# Patient Record
Sex: Male | Born: 1945
Health system: Southern US, Community
[De-identification: ages and names within clinical notes are randomized; demographics above are authoritative.]

## PROBLEM LIST (undated history)

## (undated) DIAGNOSIS — D500 Iron deficiency anemia secondary to blood loss (chronic): Secondary | ICD-10-CM

## (undated) DIAGNOSIS — K56600 Partial intestinal obstruction, unspecified as to cause: Secondary | ICD-10-CM

## (undated) DIAGNOSIS — E785 Hyperlipidemia, unspecified: Secondary | ICD-10-CM

## (undated) DIAGNOSIS — Z860101 Personal history of adenomatous and serrated colon polyps: Secondary | ICD-10-CM

## (undated) DIAGNOSIS — C182 Malignant neoplasm of ascending colon: Secondary | ICD-10-CM

## (undated) DIAGNOSIS — Z993 Dependence on wheelchair: Secondary | ICD-10-CM

## (undated) DIAGNOSIS — Z86010 Personal history of colonic polyps: Secondary | ICD-10-CM

## (undated) HISTORY — PX: COLONOSCOPY: SHX174

## (undated) HISTORY — DX: Iron deficiency anemia secondary to blood loss (chronic): D50.0

## (undated) HISTORY — PX: BACK SURGERY: SHX140

## (undated) HISTORY — DX: Partial intestinal obstruction, unspecified as to cause: K56.600

## (undated) HISTORY — DX: Malignant neoplasm of ascending colon: C18.2

## (undated) HISTORY — DX: Personal history of adenomatous and serrated colon polyps: Z86.0101

## (undated) HISTORY — DX: Personal history of colonic polyps: Z86.010

---

## 1989-01-24 HISTORY — PX: CHOLECYSTECTOMY: SHX55

## 2001-08-15 ENCOUNTER — Ambulatory Visit (HOSPITAL_COMMUNITY): Admission: RE | Admit: 2001-08-15 | Discharge: 2001-08-15 | Payer: Self-pay | Admitting: Internal Medicine

## 2001-08-15 ENCOUNTER — Encounter: Payer: Self-pay | Admitting: Internal Medicine

## 2002-01-24 DIAGNOSIS — C182 Malignant neoplasm of ascending colon: Secondary | ICD-10-CM

## 2002-01-24 HISTORY — DX: Malignant neoplasm of ascending colon: C18.2

## 2002-12-16 ENCOUNTER — Emergency Department (HOSPITAL_COMMUNITY): Admission: EM | Admit: 2002-12-16 | Discharge: 2002-12-16 | Payer: Self-pay | Admitting: *Deleted

## 2002-12-30 ENCOUNTER — Ambulatory Visit (HOSPITAL_COMMUNITY): Admission: RE | Admit: 2002-12-30 | Discharge: 2002-12-30 | Payer: Self-pay | Admitting: Gastroenterology

## 2002-12-30 ENCOUNTER — Encounter (INDEPENDENT_AMBULATORY_CARE_PROVIDER_SITE_OTHER): Payer: Self-pay | Admitting: Specialist

## 2003-01-06 ENCOUNTER — Encounter: Admission: RE | Admit: 2003-01-06 | Discharge: 2003-01-06 | Payer: Self-pay | Admitting: General Surgery

## 2003-01-06 IMAGING — CT CT PELVIS W/ CM
1 of 2 series · 14 of 32 positions shown, 18 images · IV contrast (GASTRO. & OMNIAPQUE [ID])
Comparison: none

CLINICAL DATA: 57 year old male with new diagnosis of colon cancer in the ascending colon. Con ? none
CHEST, ABDOMEN AND PELVIS CT WITH CONTRAST
Comparison ? none.
TECHNIQUE: Contiguous 5 mm axial images were obtained from the thoracic inlet through the pubic symphysis after spiral CT scanning.  Oral and 100 cc of nonionic intravenous contrast was administered prior to imaging.

[Series 2: chest/abd/pelvis · axial · 0.70mm/px · z∈[-555,-11]mm · 14 of 151 slices shown, 18 images]
[im 7/151  soft-tissue]
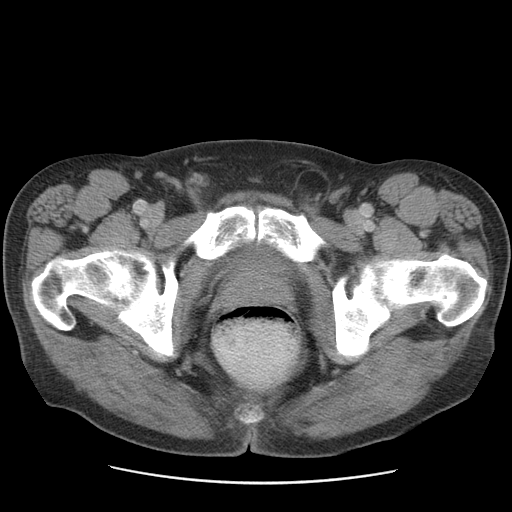
[im 7/151  bone]
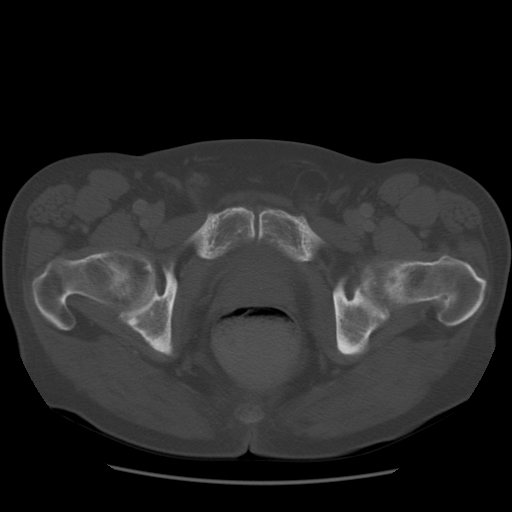
[im 20/151  soft-tissue]
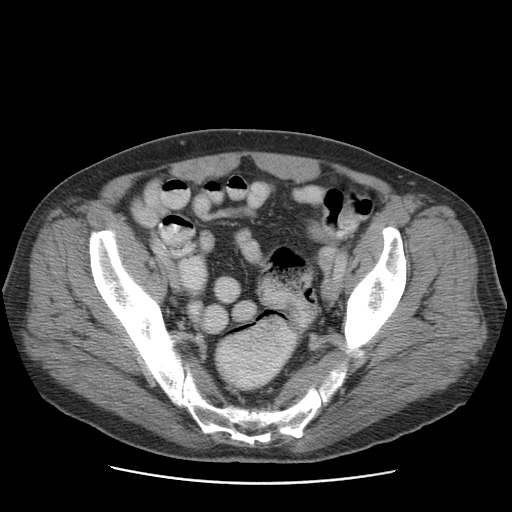
[im 33/151  soft-tissue]
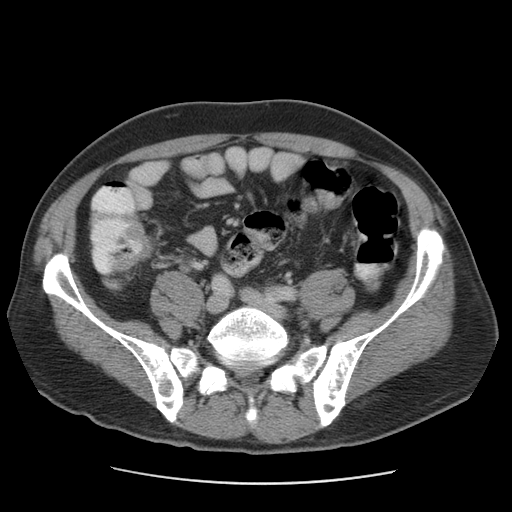
[im 46/151  soft-tissue]
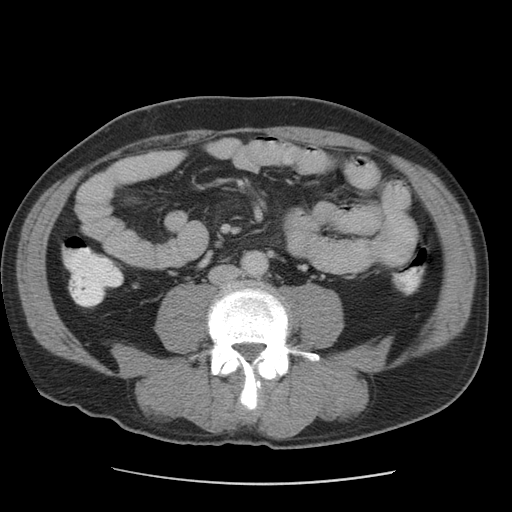
[im 59/151  soft-tissue]
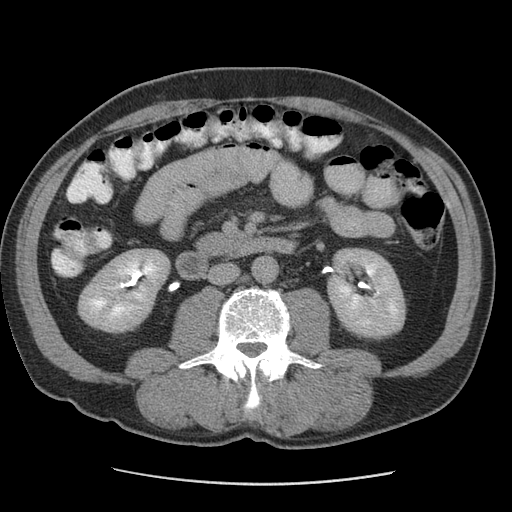
[im 72/151  soft-tissue]
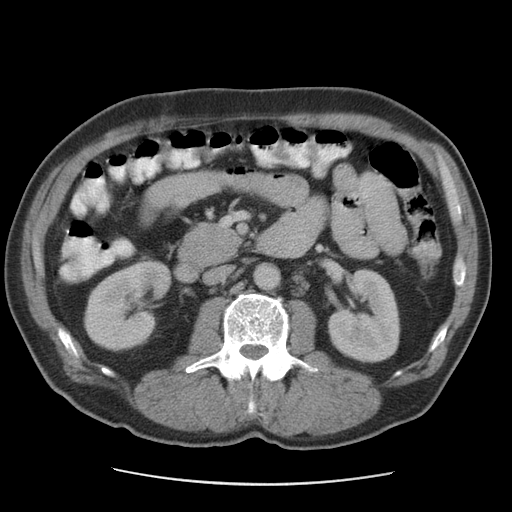
[im 79/151  soft-tissue]
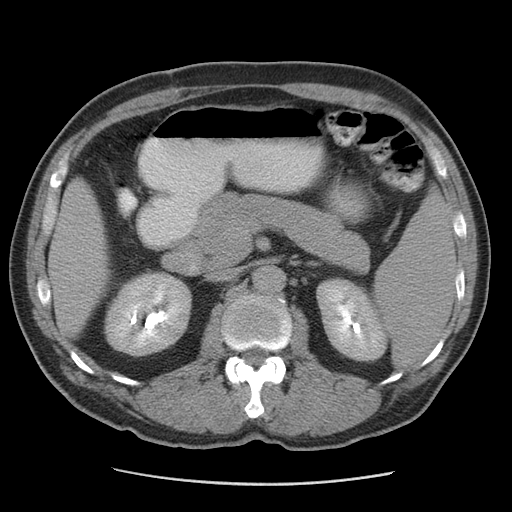
[im 92/151  soft-tissue]
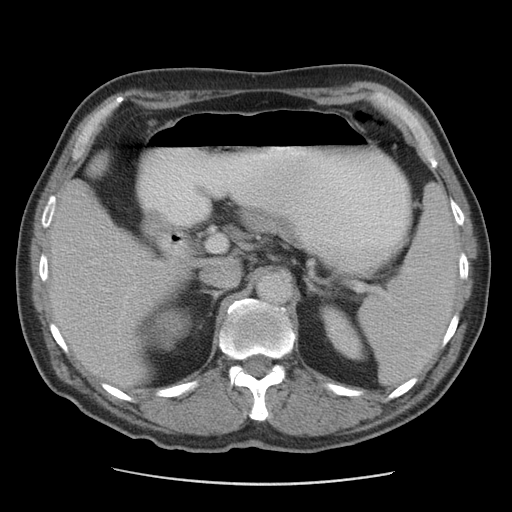
[im 105/151  soft-tissue]
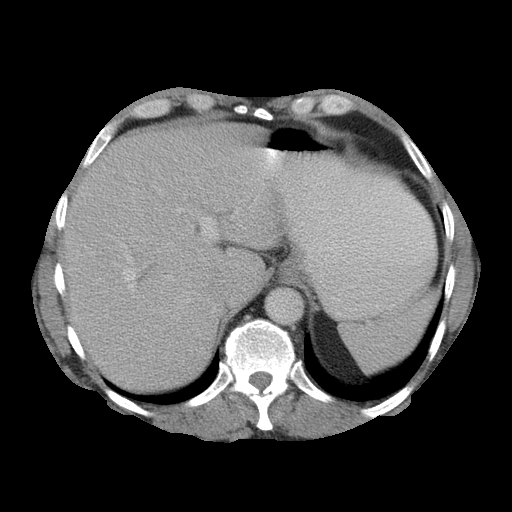
[im 105/151  bone]
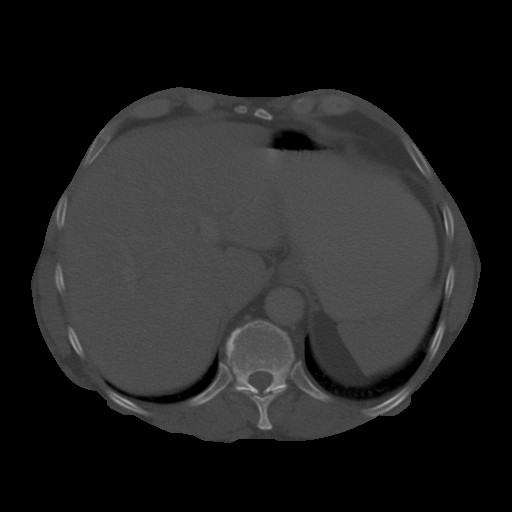
[im 118/151  soft-tissue]
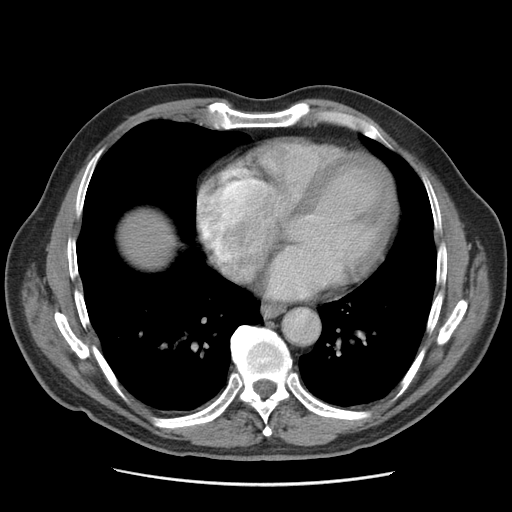
[im 124/151  lung]
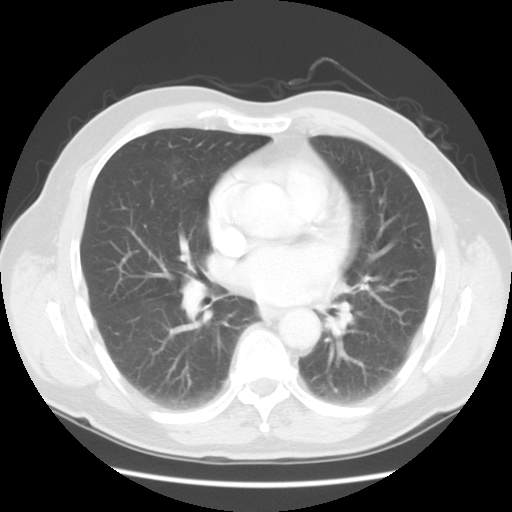
[im 131/151  soft-tissue]
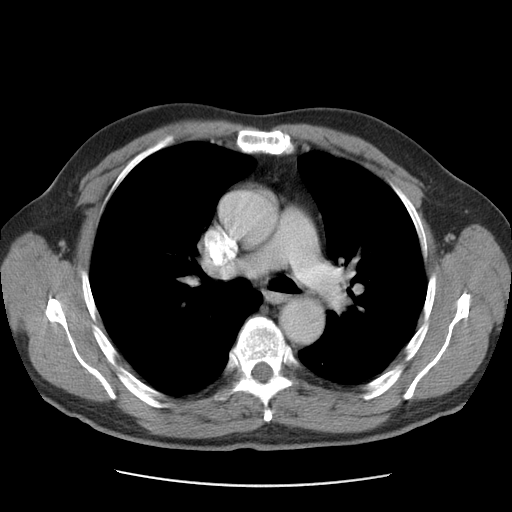
[im 131/151  lung]
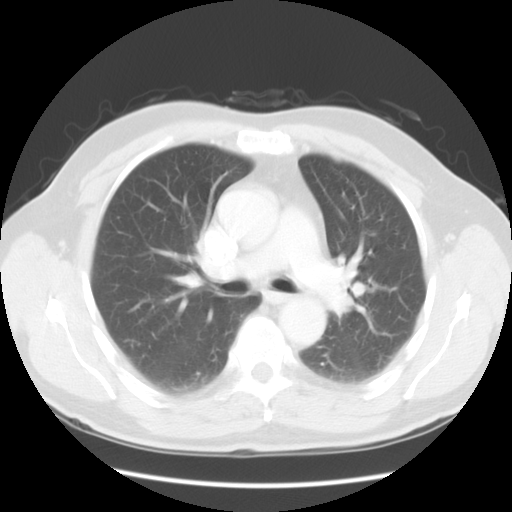
[im 137/151  lung]
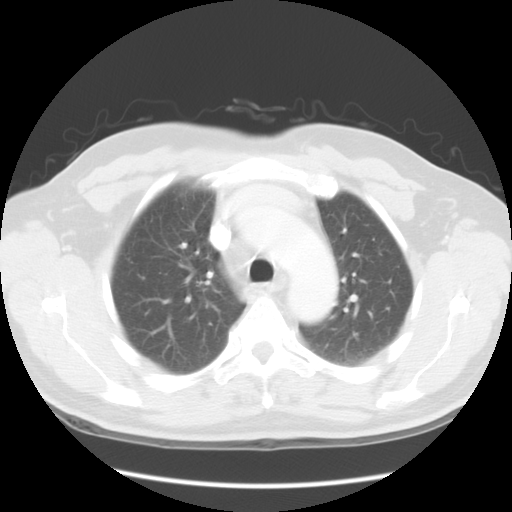
[im 144/151  soft-tissue]
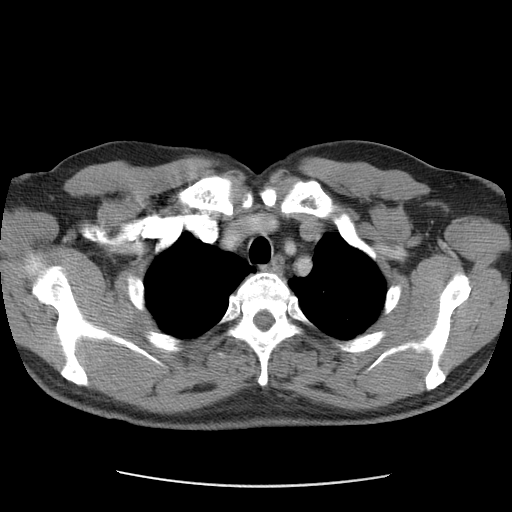
[im 144/151  lung]
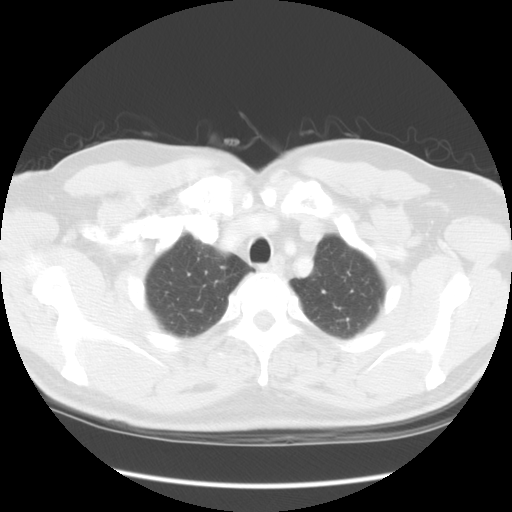

[14 of 32 positions shown; findings below may reference images not displayed]

FINDINGS: CHEST:
There is no evidence for axillary, mediastinal or hilar lymphadenopathy.  The heart is upper limits of normal for size without evidence of pericardial effusion.  There is no evidence for a pleural effusion.  

Lung windows show no parenchymal nodules or masses.  There is trace streaky opacity in the left apex, most consistent with scarring.  

IMPRESSION
No evidence for lymphadenopathy in the chest.  There are no parenchymal lung nodules or masses.

ABDOMEN:
There is no focal lesion within the liver.  The spleen, stomach, duodenum, pancreas, adrenal glands, and kidneys are unremarkable. There is no free fluid or lymphadenopathy in the abdomen.  Abdominal bowel loops have normal features.
IMPRESSION
No evidence for focal abnormality within the liver.  No lymphadenopathy is identified in the abdomen.

PELVIS:
Continued images through the anatomic pelvis show an area of apparent wall thickening in the region of the ileocecal valve.  However, review of the patient?s endoscopy report on E chart indicates that the mass in question is at the distal ascending colon, just proximal to the hepatic flexure.  The ileocecal valve can often be difficult to assess by CT, but there does appear to be some extension of soft tissue attenuation into the pericolonic fat.  There is an adjacent 12 x 11 mm ileocolic node.  Despite the colonoscopy report, no discrete colonic mass is identified in the region of the hepatic flexure.  There is no free fluid in the anatomic pelvis.  The bladder and prostate are unremarkable.  A left inguinal hernia contains only fat.  Bone windows show demineralization in the anatomic pelvis with no sclerotic nor lytic lesion identified.
IMPRESSION
1.  A region of apparent soft tissue thickening, just distal to the ileocecal valve is noted by CT, but this does not correlate with the location described at endoscopy.  This may be related to mucosal redundancy which can be seen in the region of the ileocecal junction.  There is a 12 mm ileocolic node apparent with other smaller nodes seen in the adjacent mesentery.  No definite mass is seen in the region of the abnormality identified at endoscopy.
2.  Left inguinal hernia contains only fat. 

co

## 2003-01-15 ENCOUNTER — Encounter (INDEPENDENT_AMBULATORY_CARE_PROVIDER_SITE_OTHER): Payer: Self-pay | Admitting: Specialist

## 2003-01-15 ENCOUNTER — Inpatient Hospital Stay (HOSPITAL_COMMUNITY): Admission: RE | Admit: 2003-01-15 | Discharge: 2003-01-21 | Payer: Self-pay | Admitting: General Surgery

## 2003-01-15 HISTORY — PX: HEMICOLECTOMY: SHX854

## 2003-02-28 ENCOUNTER — Ambulatory Visit (HOSPITAL_COMMUNITY): Admission: RE | Admit: 2003-02-28 | Discharge: 2003-02-28 | Payer: Self-pay | Admitting: General Surgery

## 2003-02-28 IMAGING — CR DG CHEST 1V PORT
1 series · 1 of 1 positions shown · non-contrast
Comparison: none

CLINICAL DATA: colon cancer; port-a-cath insertion
 PORTABLE CHEST 
 Comparing chest CT of [DATE].

[view not recorded]
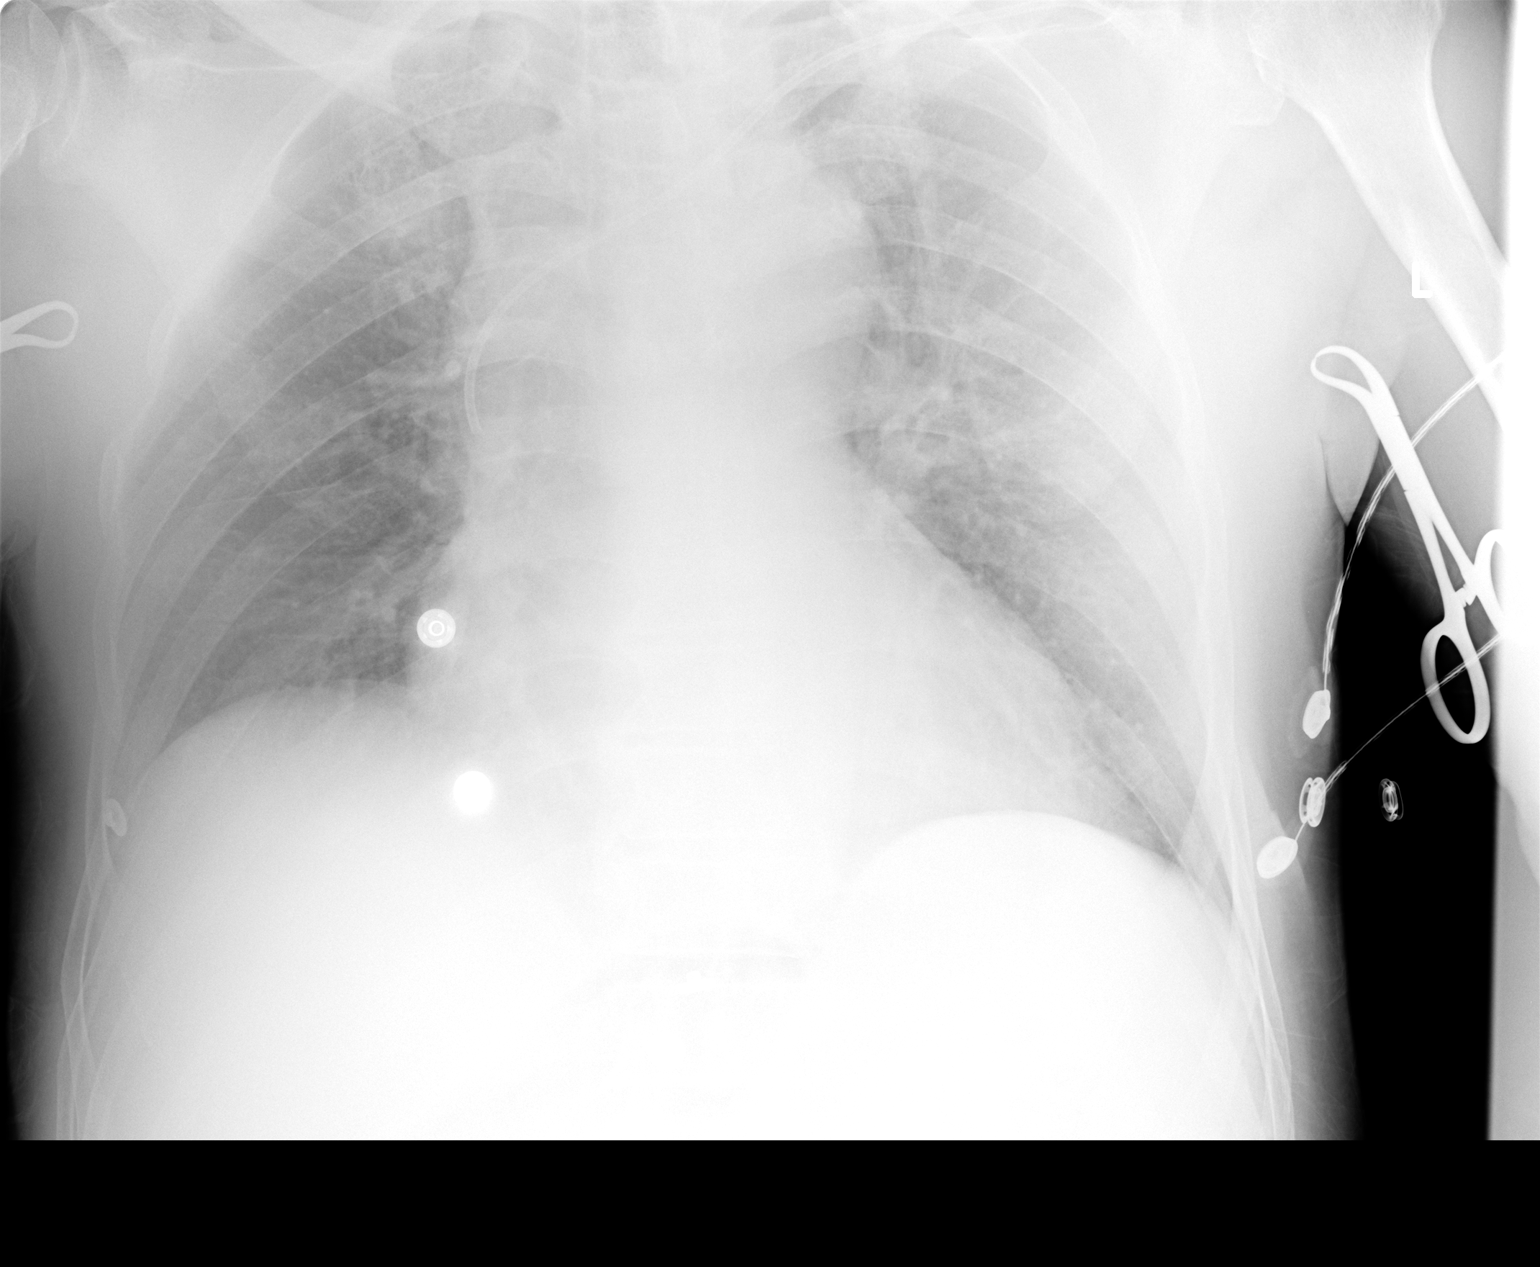

[1 of 1 positions shown; findings below may reference images not displayed]

FINDINGS: There is a mild prominence of the interstitium, left greater than right raising the possibility of mild interstitial edema.  Left subclavian central venous catheter is present with its tip in the superior vena cava.  There is no visible pneumothorax.  
 IMPRESSION
 Mild interstitial prominence in the lungs left greater than right.  Low lung volumes.
 Left-sided subclavian line is present with its tip in the superior vena cava.

## 2003-12-26 ENCOUNTER — Ambulatory Visit (HOSPITAL_COMMUNITY): Admission: RE | Admit: 2003-12-26 | Discharge: 2003-12-26 | Payer: Self-pay | Admitting: Oncology

## 2003-12-26 IMAGING — CT CT PELVIS W/ CM
1 of 3 series · 14 of 32 positions shown, 19 images · IV contrast (omnipaque)
Comparison: [DATE]--preop exam.

CLINICAL DATA: Colon cancer--status post resection.
TECHNIQUE: Multidetector helical imaging utilizing oral and IV contrast -- 150 cc Omnipaque 300.

[Series 2: abd/pelvis 5.0 b30f · axial · 0.70mm/px · z∈[-686,-241]mm · 14 of 99 slices shown, 19 images]
[im 5/99  soft-tissue]
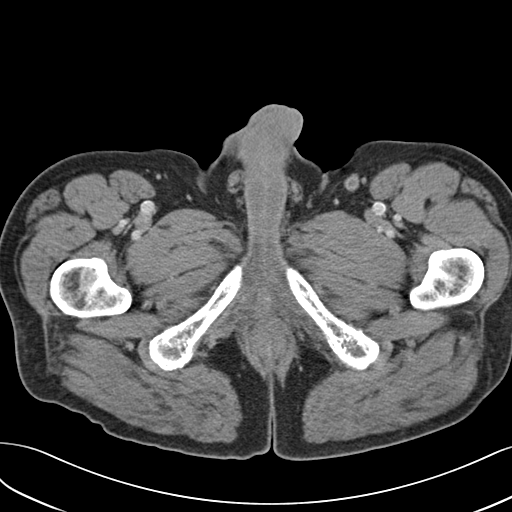
[im 5/99  bone]
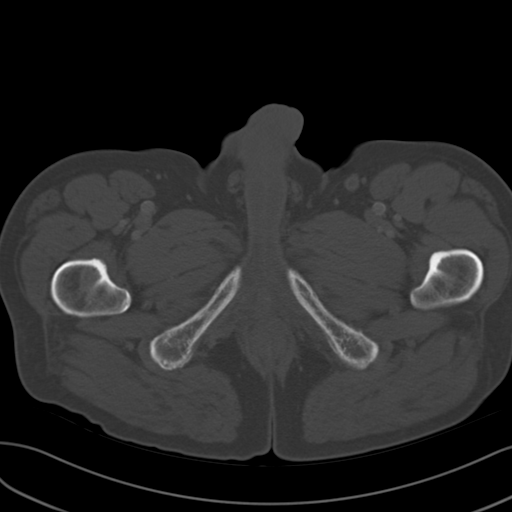
[im 15/99  soft-tissue]
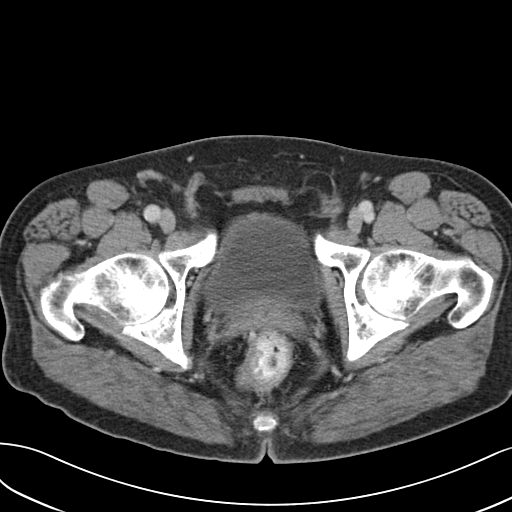
[im 19/99  soft-tissue]
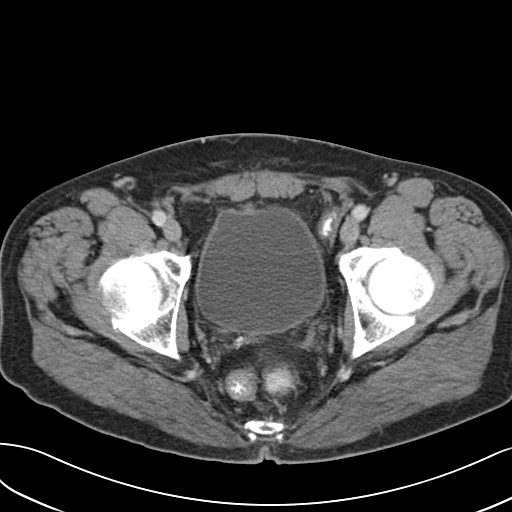
[im 29/99  soft-tissue]
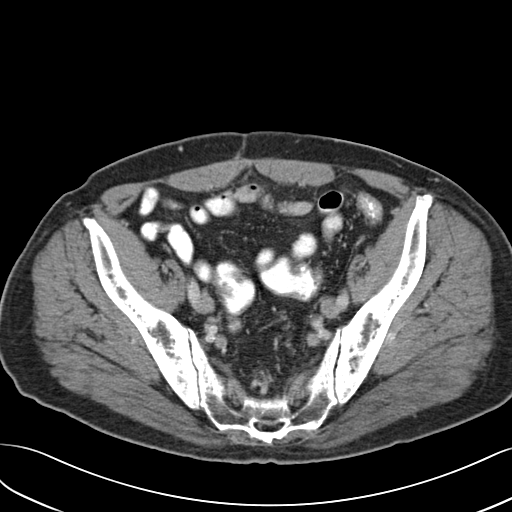
[im 33/99  soft-tissue]
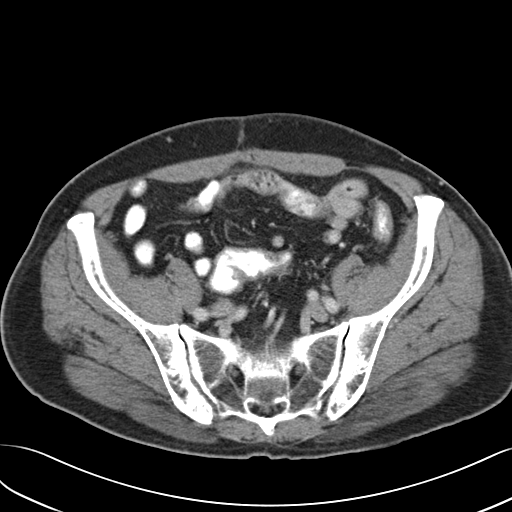
[im 43/99  soft-tissue]
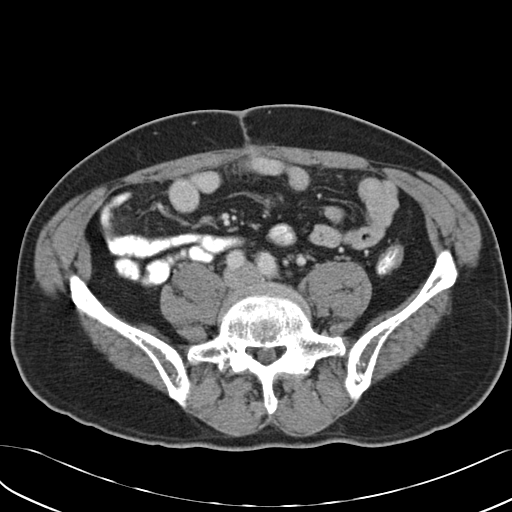
[im 52/99  soft-tissue]
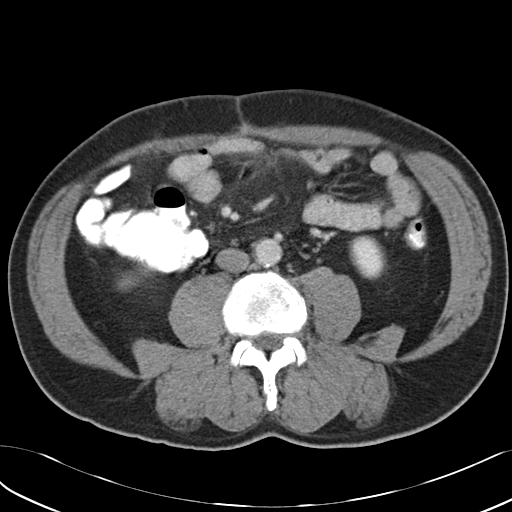
[im 57/99  soft-tissue]
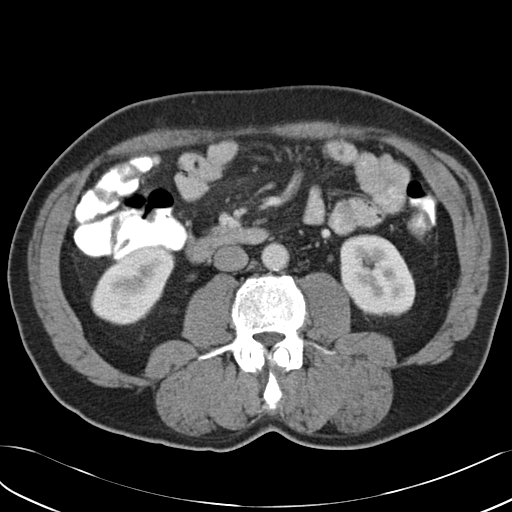
[im 66/99  soft-tissue]
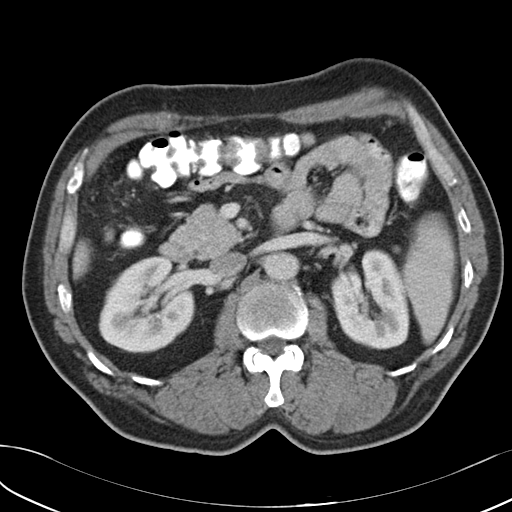
[im 66/99  bone]
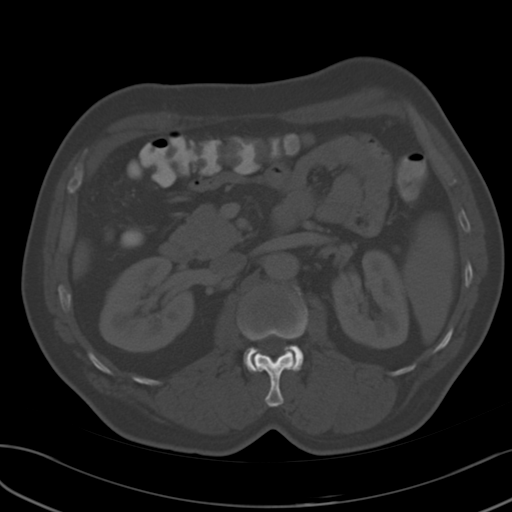
[im 71/99  soft-tissue]
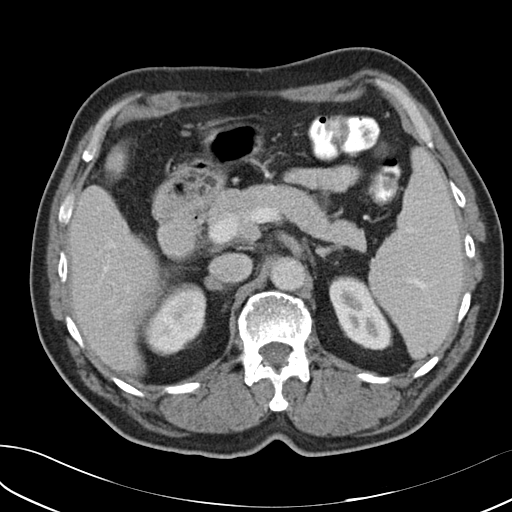
[im 80/99  soft-tissue]
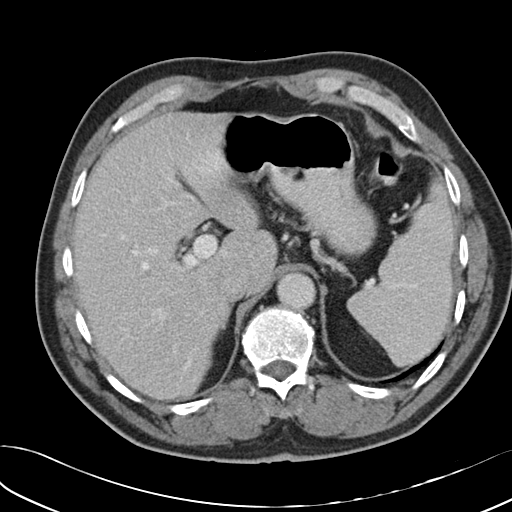
[im 80/99  lung]
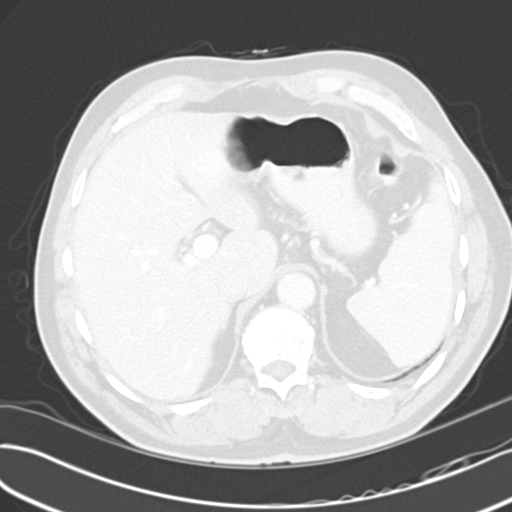
[im 85/99  soft-tissue]
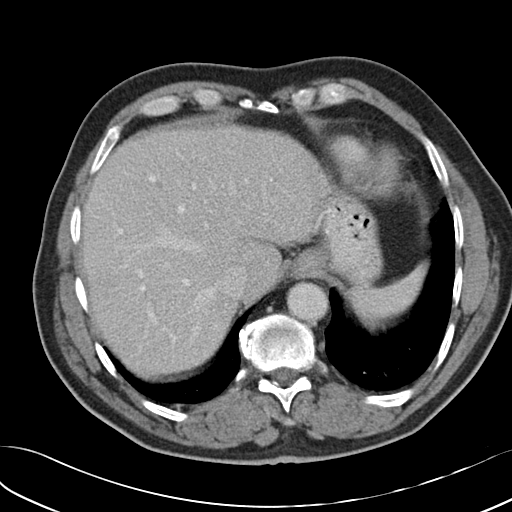
[im 85/99  lung]
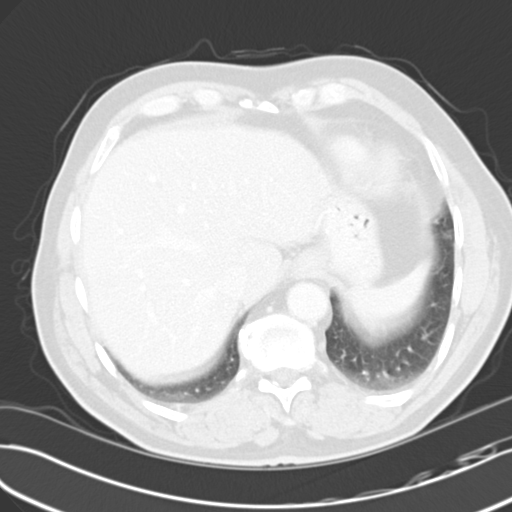
[im 89/99  lung]
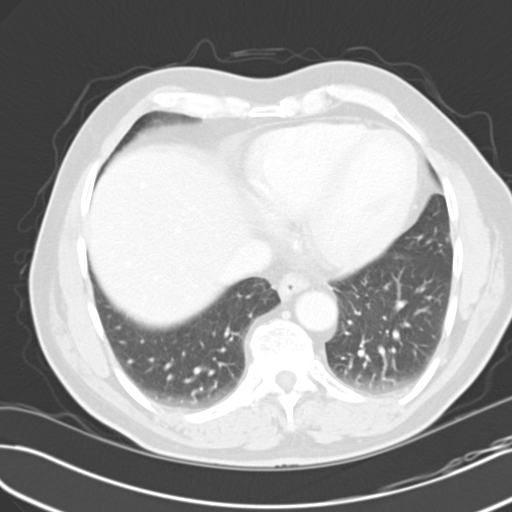
[im 94/99  soft-tissue]
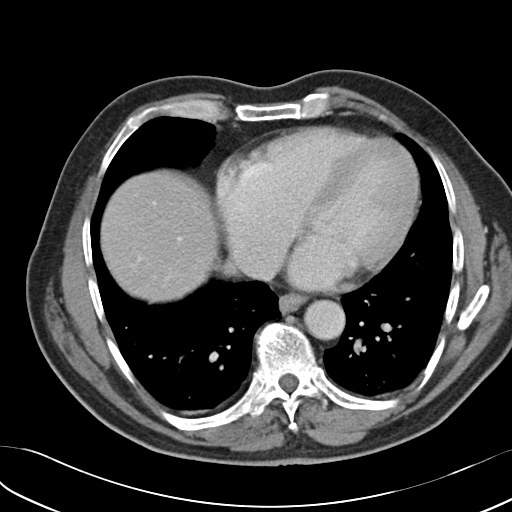
[im 94/99  lung]
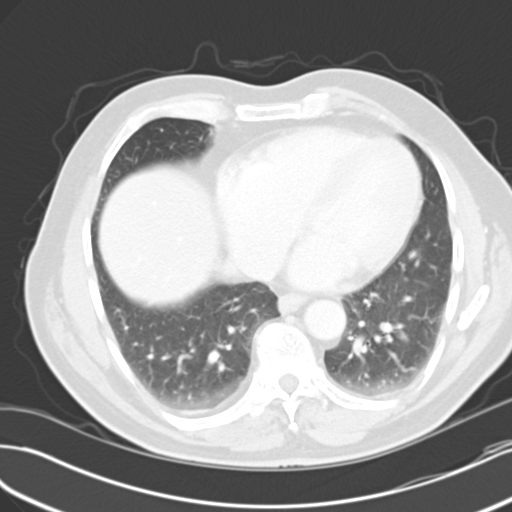

[14 of 32 positions shown; findings below may reference images not displayed]

CT ABDOMEN WITH CONTRAST:
 Lung bases clear.  Liver, spleen, and adrenals remain unremarkable.  On image #29, there is a 12 x 7 mm nodular shadow that seems to be emanating off the inferior aspect of the adrenal gland, situated just adjacent to the IVC.  A similar density was present on the prior study, but it is a bit larger on today?s exam than before.  This is likely an adrenal adenoma.  However, a slow-growing metastatic deposit cannot be excluded.  One might consider short-term follow-up vs. MRI or PET CT, if further assessment is warranted on a more immediate basis.  
 No mediastinal adenopathy.  No ascites.  No mesenteric masses.  Kidneys normal.
IMPRESSION: Study is normal except that there is a nodular density that is probably emanating off the inferior aspect of the right adrenal.  This was present one year ago, but it appears slightly larger on today?s exam.  See above discussion.
 CT PELVIS WITH CONTRAST:
 Status post right colonic resection.  No residual mass effect.  No mesenteric adenopathy.  Pelvic side walls and pre sacral space normal.
 Again noted is a left inguinal hernia that contains fat.
IMPRESSION: No acute or significant findings in the pelvis.

## 2003-12-26 IMAGING — CR DG CHEST 2V
3 series · 3 of 3 positions shown · non-contrast
Comparison: One view chest x-ray [DATE].

CLINICAL DATA: Colon cancer.

CHEST - 2 VIEW  [DATE]:

[view not recorded (1 of 3)]
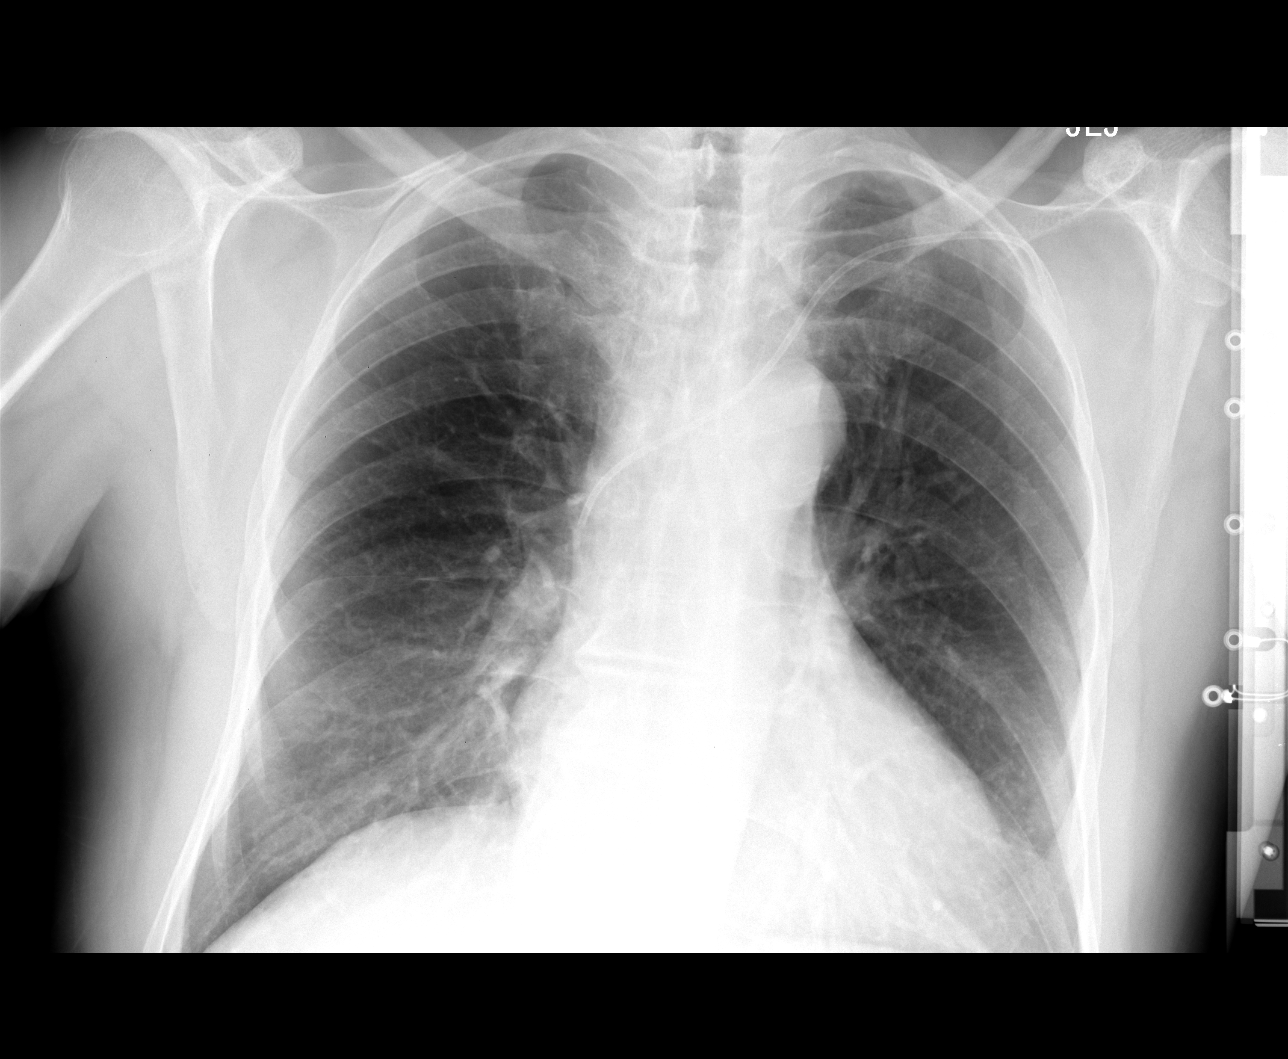

[view not recorded (2 of 3)]
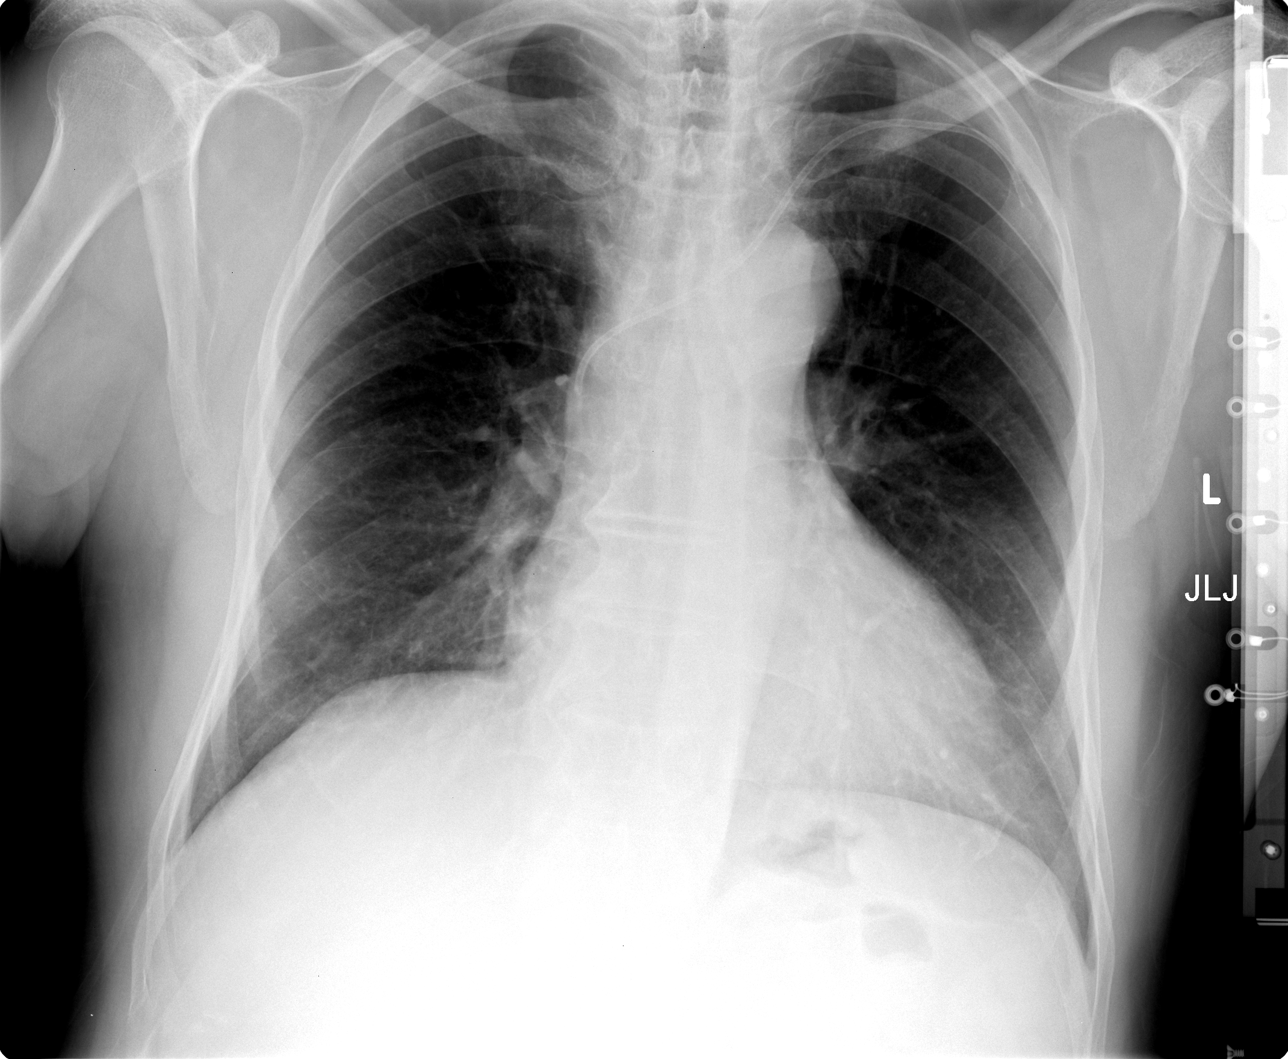

[view not recorded (3 of 3)]
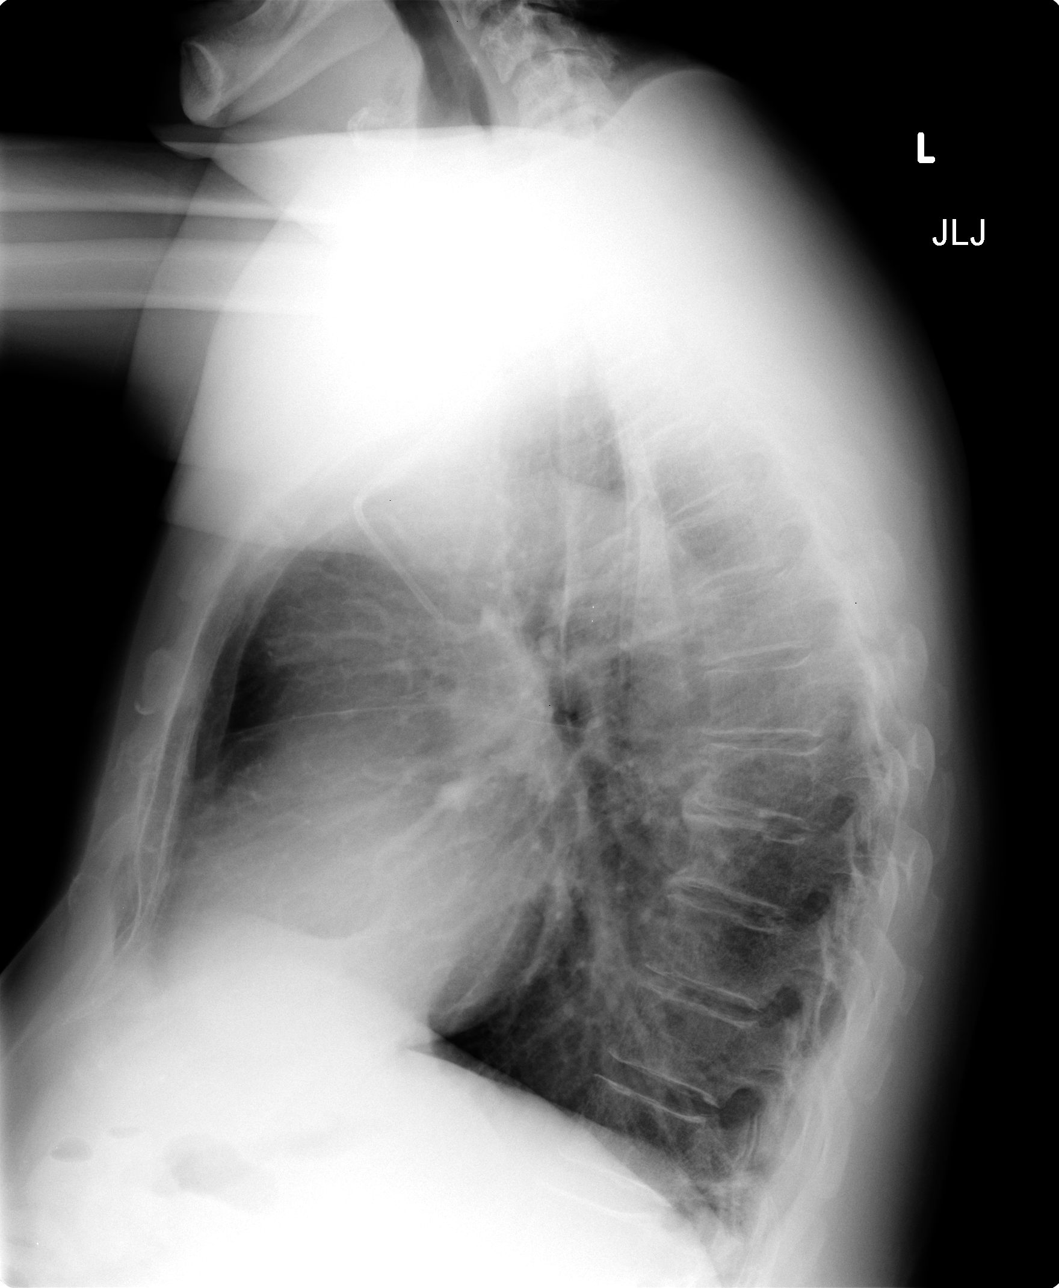

[3 of 3 positions shown; findings below may reference images not displayed]

FINDINGS: The left subclavian Port-A-Cath has its tip in the upper SVC,
unchanged. The heart size is upper normal but stable. The thoracic aorta is
minimally atherosclerotic though unchanged. The hilar and mediastinal contours
are otherwise unremarkable. The lungs appear clear. Mildly prominent
bronchovascular markings diffusely are stable and may reflect chronic
bronchitis. There are degenerative changes in the mid thoracic spine.
IMPRESSION: Possible COPD. No evidence of acute disease.

## 2003-12-30 ENCOUNTER — Ambulatory Visit (HOSPITAL_COMMUNITY): Admission: RE | Admit: 2003-12-30 | Discharge: 2003-12-30 | Payer: Self-pay | Admitting: Gastroenterology

## 2003-12-30 ENCOUNTER — Encounter (INDEPENDENT_AMBULATORY_CARE_PROVIDER_SITE_OTHER): Payer: Self-pay | Admitting: *Deleted

## 2004-01-09 ENCOUNTER — Ambulatory Visit: Payer: Self-pay | Admitting: Oncology

## 2004-03-05 ENCOUNTER — Ambulatory Visit: Payer: Self-pay | Admitting: Oncology

## 2004-04-30 ENCOUNTER — Ambulatory Visit: Payer: Self-pay | Admitting: Oncology

## 2004-06-28 ENCOUNTER — Ambulatory Visit: Payer: Self-pay | Admitting: Oncology

## 2004-08-23 ENCOUNTER — Ambulatory Visit: Payer: Self-pay | Admitting: Oncology

## 2004-10-28 ENCOUNTER — Ambulatory Visit: Payer: Self-pay | Admitting: Oncology

## 2004-11-10 ENCOUNTER — Ambulatory Visit (HOSPITAL_COMMUNITY): Admission: RE | Admit: 2004-11-10 | Discharge: 2004-11-10 | Payer: Self-pay | Admitting: Oncology

## 2004-11-10 IMAGING — CT CT ABDOMEN W/ CM
1 of 4 series · 14 of 32 positions shown, 19 images · IV contrast (omnipaque)
Comparison: [DATE].

CLINICAL DATA: Colon cancer. 
 ABDOMEN CT WITH CONTRAST:
TECHNIQUE: Multidetector CT imaging of the abdomen was performed following the standard protocol during bolus administration of intravenous contrast.
 Contrast:  125 cc Omnipaque 300.
TECHNIQUE: Multidetector CT imaging of the pelvis was performed following the standard protocol during bolus administration of intravenous contrast.

[Series 2: abd_pel 5.0 b40f st · axial · 0.77mm/px · z∈[-552,-172]mm · 14 of 88 slices shown, 19 images]
[im 6/88  soft-tissue]
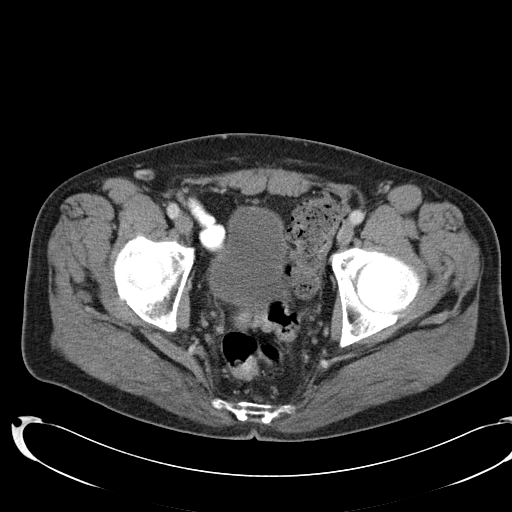
[im 6/88  bone]
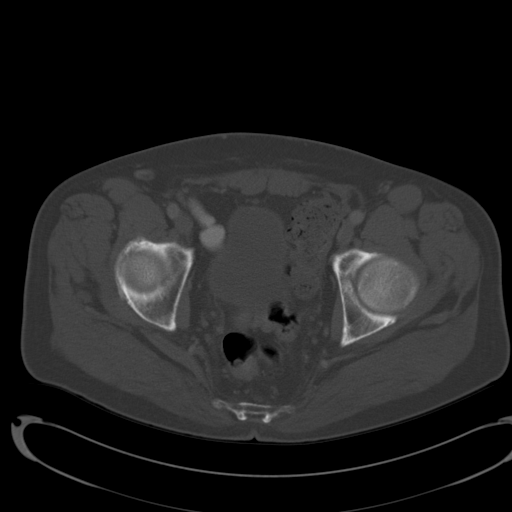
[im 11/88  soft-tissue]
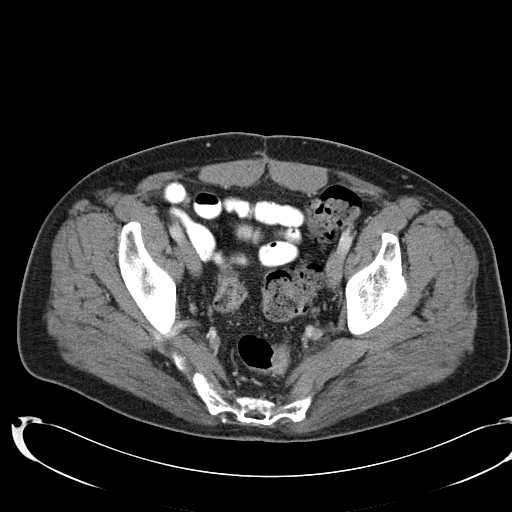
[im 21/88  soft-tissue]
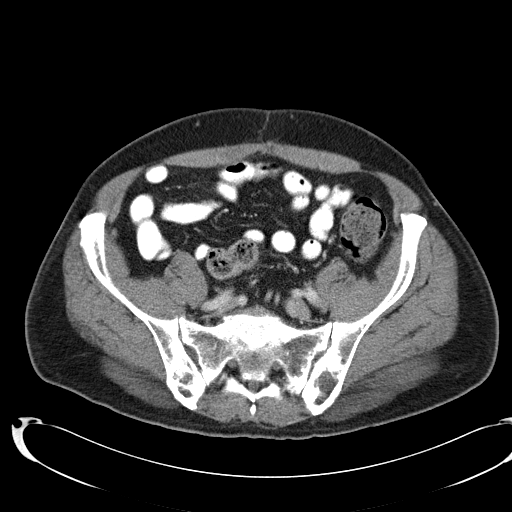
[im 26/88  soft-tissue]
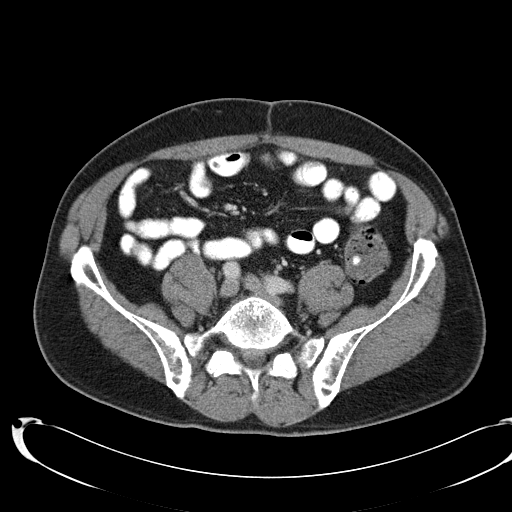
[im 31/88  soft-tissue]
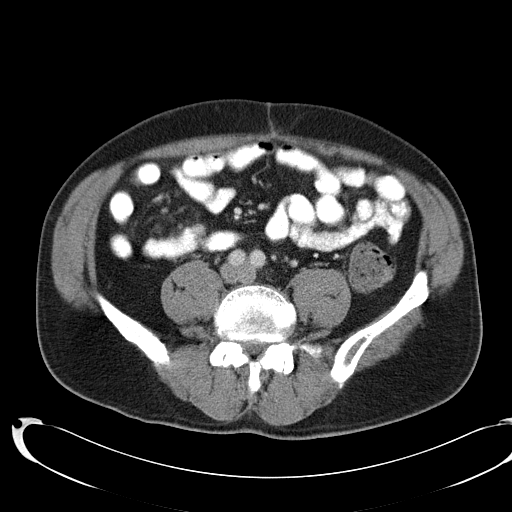
[im 36/88  soft-tissue]
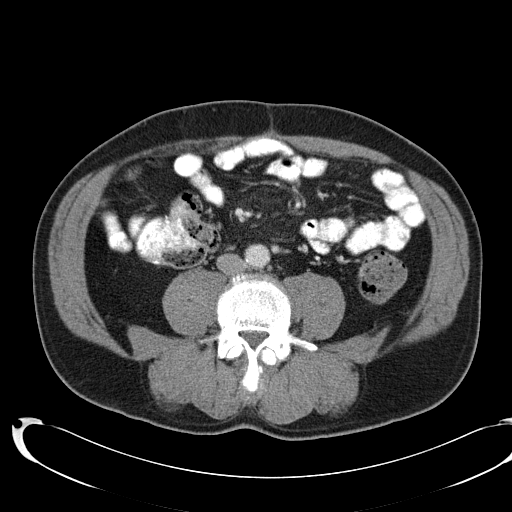
[im 47/88  soft-tissue]
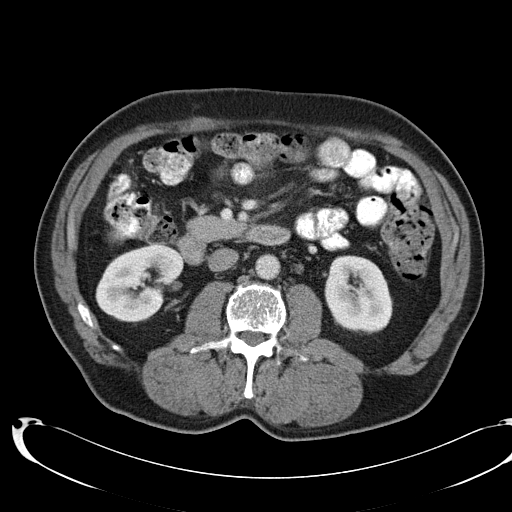
[im 52/88  soft-tissue]
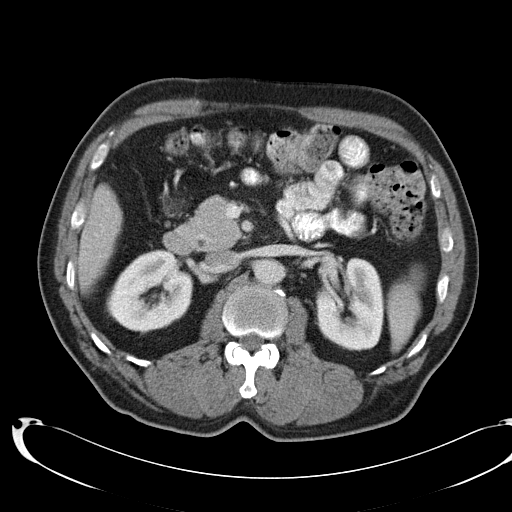
[im 57/88  soft-tissue]
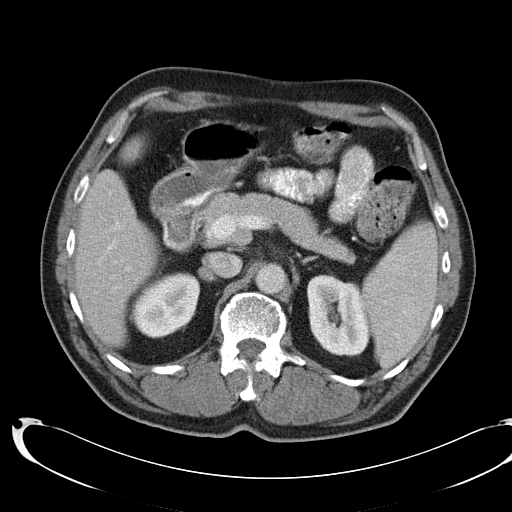
[im 57/88  bone]
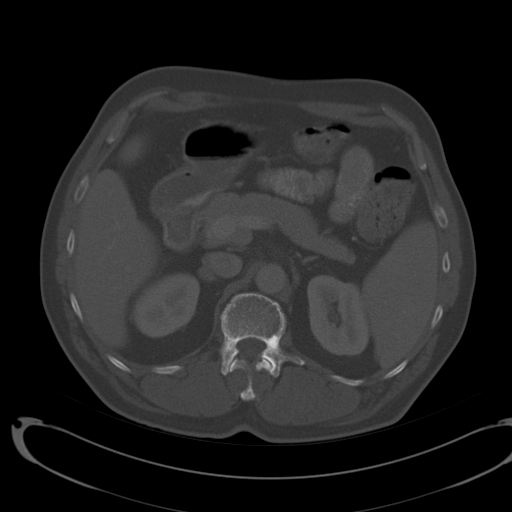
[im 62/88  soft-tissue]
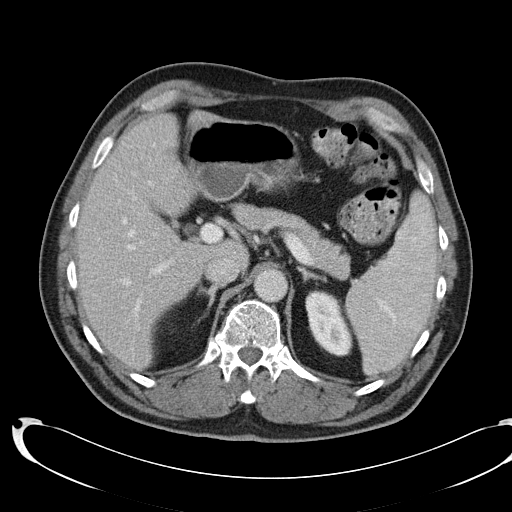
[im 67/88  soft-tissue]
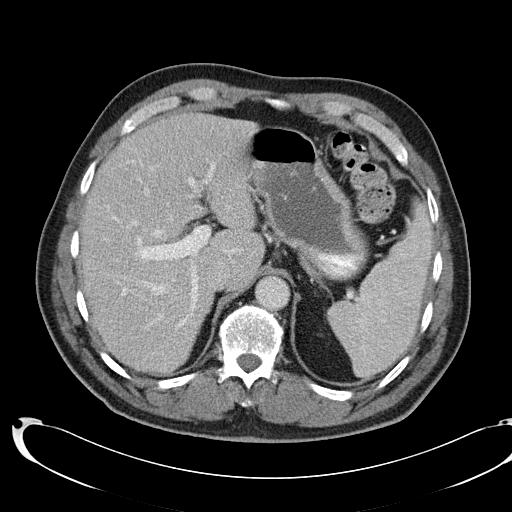
[im 67/88  lung]
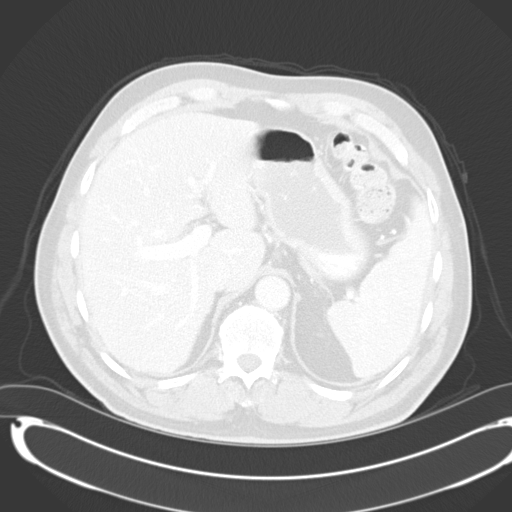
[im 72/88  lung]
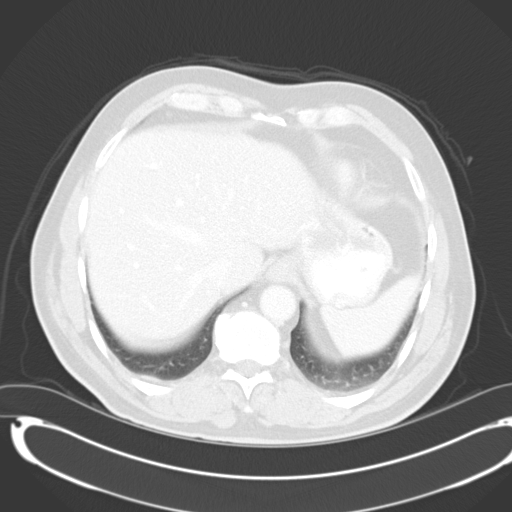
[im 77/88  soft-tissue]
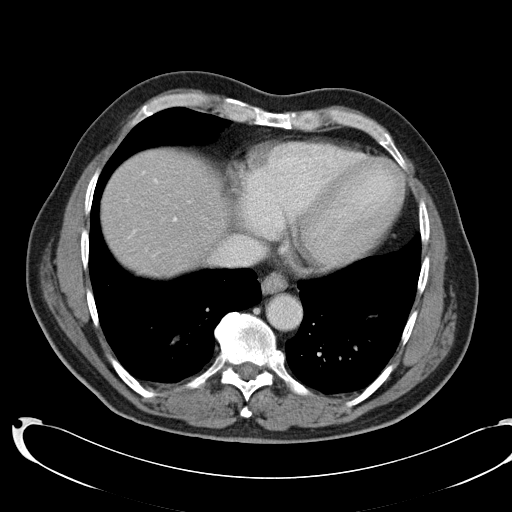
[im 77/88  lung]
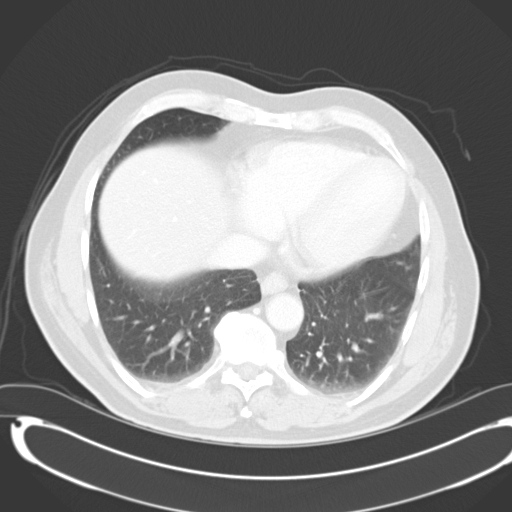
[im 82/88  soft-tissue]
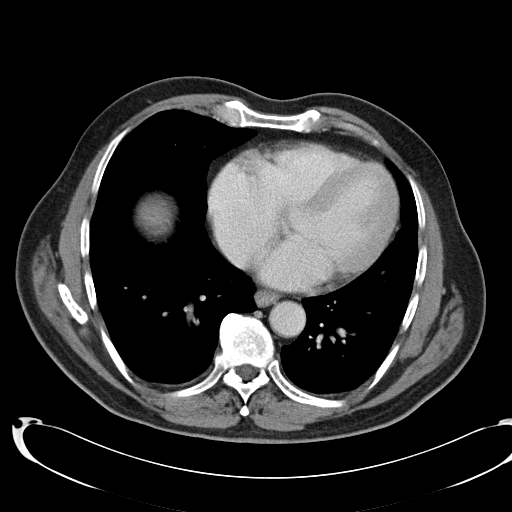
[im 82/88  lung]
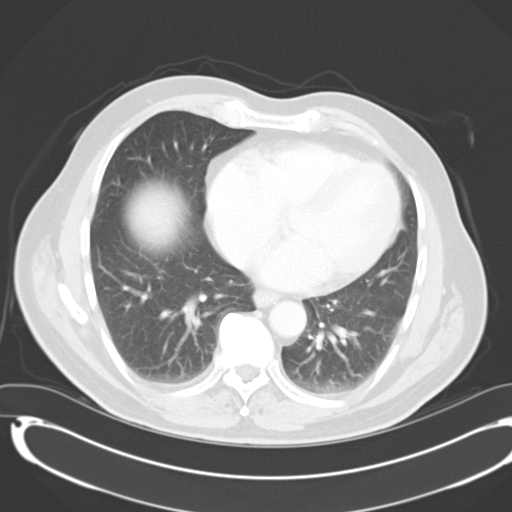

[14 of 32 positions shown; findings below may reference images not displayed]

FINDINGS: Triangular-shaped subpleural nodule in the left lower lobe is probably a subpleural lymph node.  The liver, spleen, stomach, duodenum, pancreas, and left adrenal gland are stable.  No interval change in the 8 x 12 mm tiny right adrenal nodule.  Kidneys are unremarkable.  There is no free fluid, lymphadenopathy, or abdominal aortic aneurysm.
IMPRESSION: Stable exam.  There is a very tiny nodule in the right adrenal gland, as before. 
 PELVIS CT WITH CONTRAST:
FINDINGS: No free fluid.  No lymphadenopathy.  There are several very tiny lymph nodes at the level of the right-sided ileocolic anastomosis.  These features are unchanged in the interval.  A left inguinal hernia contains only fat.  Prominent demineralization of the bony anatomic pelvis is stable, compared back to the [DATE] exam.
IMPRESSION: Tiny lymph nodes at the ileocolic anastomosis are unchanged.  No evidence for local or distant recurrence.

## 2004-12-29 ENCOUNTER — Ambulatory Visit: Payer: Self-pay | Admitting: Oncology

## 2004-12-30 ENCOUNTER — Ambulatory Visit (HOSPITAL_COMMUNITY): Admission: RE | Admit: 2004-12-30 | Discharge: 2004-12-30 | Payer: Self-pay | Admitting: Oncology

## 2004-12-30 IMAGING — CR DG CHEST 2V
2 series · 2 of 2 positions shown · non-contrast
Comparison: [DATE].

CLINICAL DATA: History of   colon cancer.
 CHEST - 2 VIEW:

[view not recorded (1 of 2)]
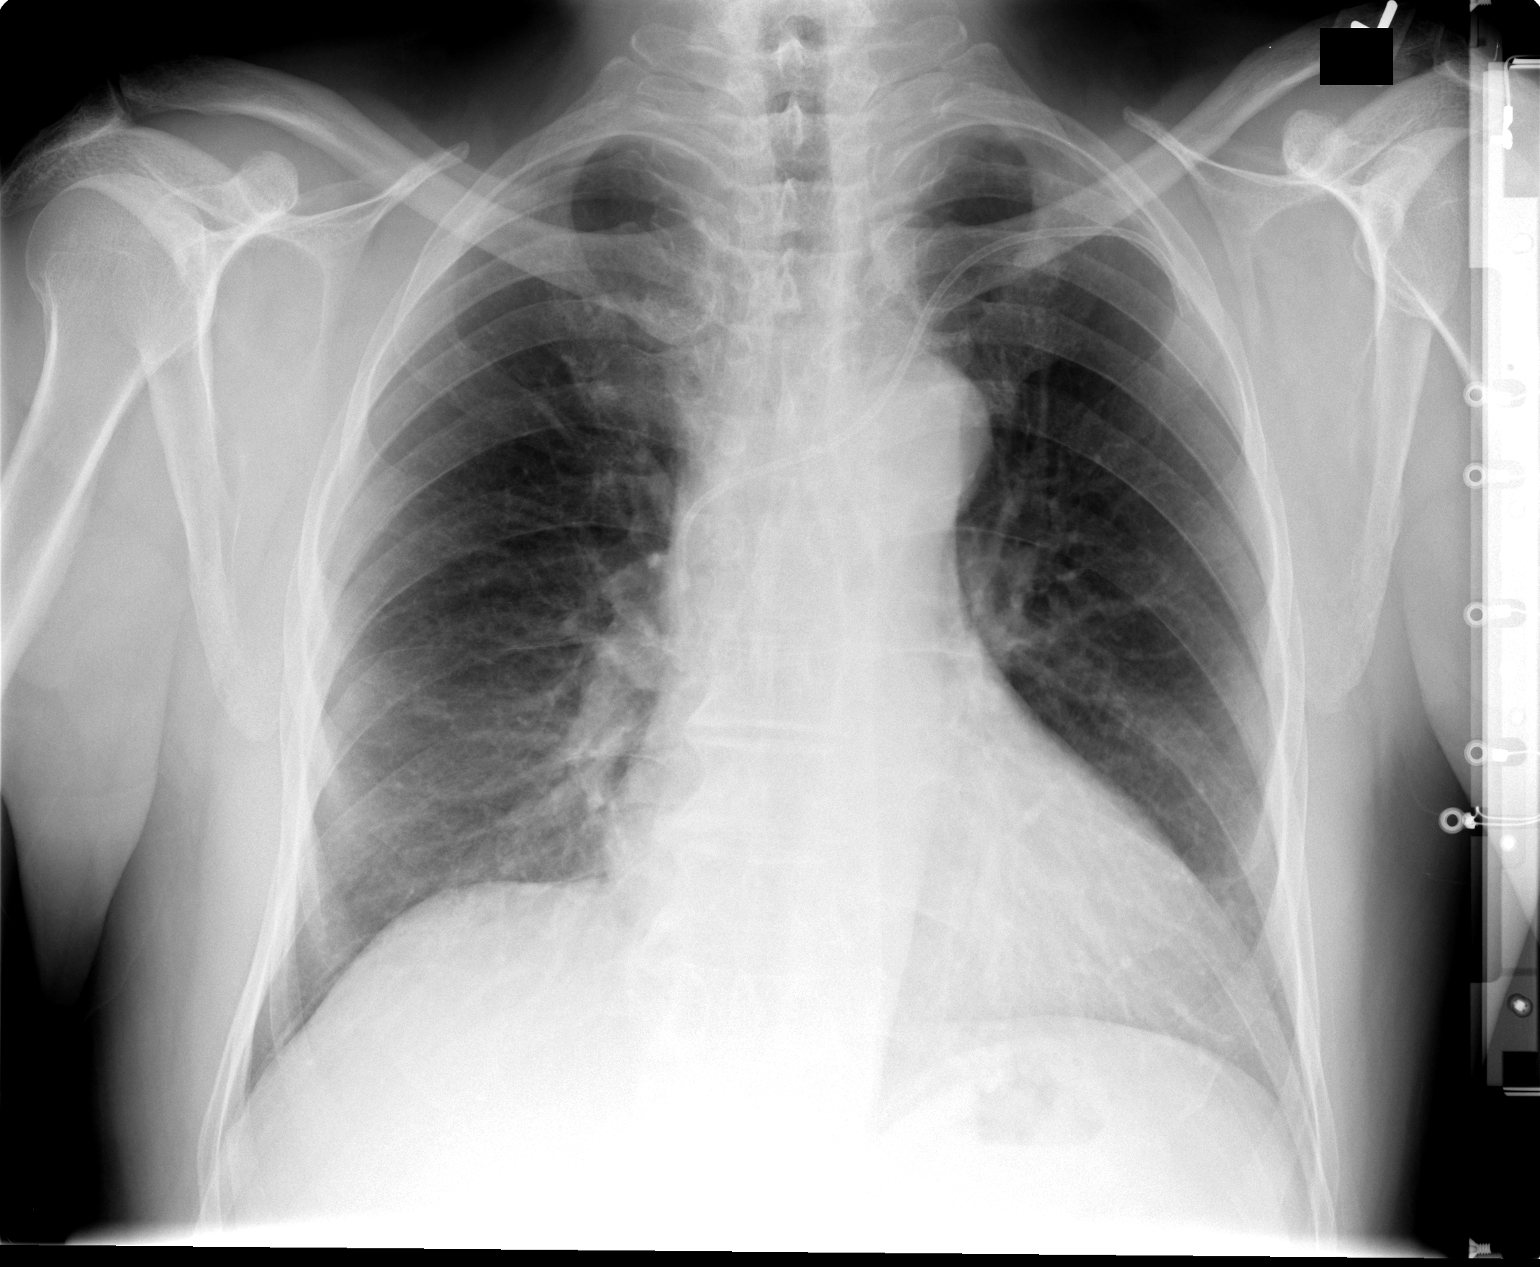

[view not recorded (2 of 2)]
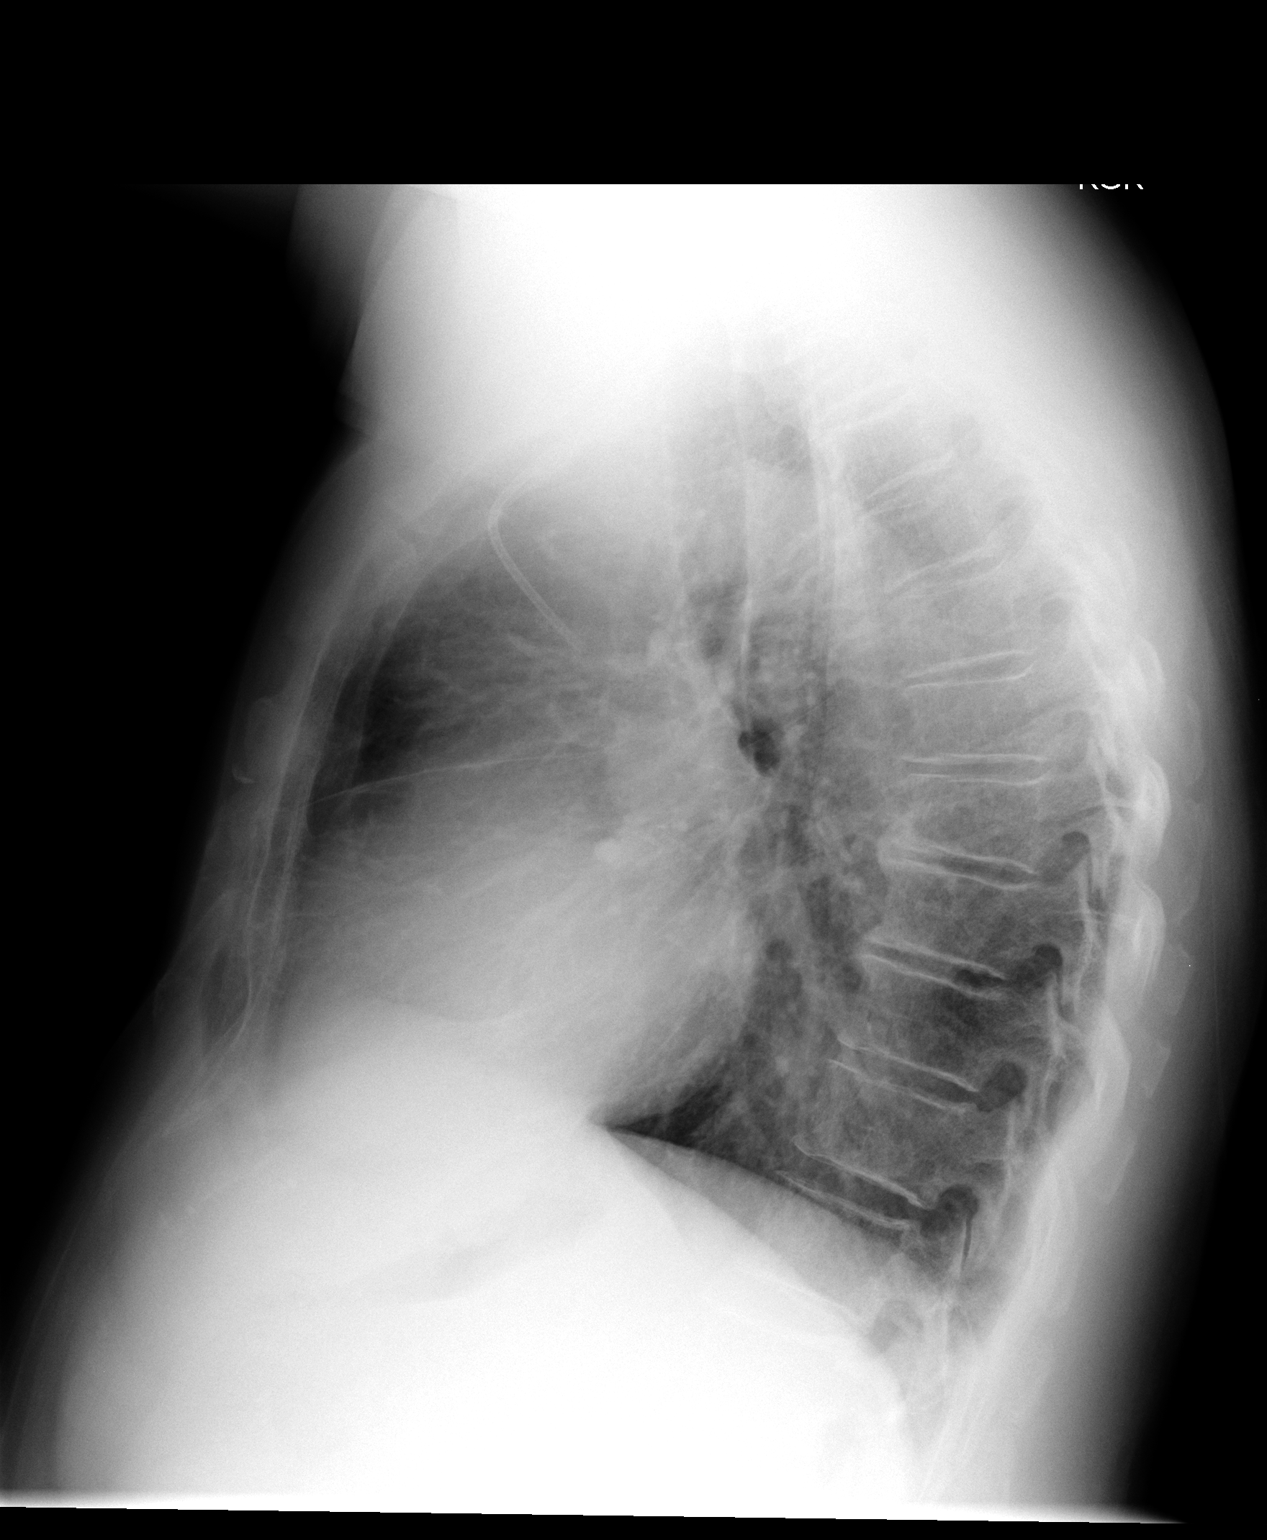

[2 of 2 positions shown; findings below may reference images not displayed]

FINDINGS: PA and lateral views reveal the heart size to remain enlarged, particularly in the region of the left ventricle.  Port-A-Cath tip remains in position in the superior vena cava.  No active lung findings.  Markings do remain accentuated in the lung bases.
IMPRESSION: No change.  Cardiomegaly.  No active lung disease.

## 2005-02-24 ENCOUNTER — Ambulatory Visit: Payer: Self-pay | Admitting: Oncology

## 2005-04-22 ENCOUNTER — Ambulatory Visit: Payer: Self-pay | Admitting: Oncology

## 2005-06-19 ENCOUNTER — Ambulatory Visit: Payer: Self-pay | Admitting: Oncology

## 2005-06-24 LAB — CBC WITH DIFFERENTIAL/PLATELET
BASO%: 0.2 % (ref 0.0–2.0)
LYMPH%: 20.7 % (ref 14.0–48.0)
MCHC: 34.6 g/dL (ref 32.0–35.9)
MONO#: 0.6 10*3/uL (ref 0.1–0.9)
NEUT#: 4.7 10*3/uL (ref 1.5–6.5)
Platelets: 177 10*3/uL (ref 145–400)
RBC: 4.56 10*6/uL (ref 4.20–5.71)
RDW: 12.6 % (ref 11.2–14.6)
WBC: 6.7 10*3/uL (ref 4.0–10.0)

## 2005-06-24 LAB — COMPREHENSIVE METABOLIC PANEL
AST: 21 U/L (ref 0–37)
BUN: 10 mg/dL (ref 6–23)
CO2: 25 mEq/L (ref 19–32)
Calcium: 8.9 mg/dL (ref 8.4–10.5)
Chloride: 106 mEq/L (ref 96–112)
Creatinine, Ser: 0.8 mg/dL (ref 0.40–1.50)
Glucose, Bld: 114 mg/dL — ABNORMAL HIGH (ref 70–99)

## 2005-06-24 LAB — CEA: CEA: 1 ng/mL (ref 0.0–5.0)

## 2005-06-24 LAB — LACTATE DEHYDROGENASE: LDH: 151 U/L (ref 94–250)

## 2005-08-15 ENCOUNTER — Ambulatory Visit: Payer: Self-pay | Admitting: Oncology

## 2005-10-12 ENCOUNTER — Ambulatory Visit: Payer: Self-pay | Admitting: Oncology

## 2005-10-14 LAB — CBC WITH DIFFERENTIAL/PLATELET
Basophils Absolute: 0 10*3/uL (ref 0.0–0.1)
Eosinophils Absolute: 0 10*3/uL (ref 0.0–0.5)
HCT: 42.1 % (ref 38.7–49.9)
HGB: 14.9 g/dL (ref 13.0–17.1)
NEUT#: 4.4 10*3/uL (ref 1.5–6.5)
NEUT%: 65.9 % (ref 40.0–75.0)
RDW: 12.8 % (ref 11.2–14.6)
lymph#: 1.6 10*3/uL (ref 0.9–3.3)

## 2005-10-14 LAB — COMPREHENSIVE METABOLIC PANEL
Albumin: 4.8 g/dL (ref 3.5–5.2)
Alkaline Phosphatase: 59 U/L (ref 39–117)
BUN: 11 mg/dL (ref 6–23)
CO2: 26 mEq/L (ref 19–32)
Calcium: 9.3 mg/dL (ref 8.4–10.5)
Chloride: 103 mEq/L (ref 96–112)
Glucose, Bld: 124 mg/dL — ABNORMAL HIGH (ref 70–99)
Potassium: 3.6 mEq/L (ref 3.5–5.3)
Sodium: 140 mEq/L (ref 135–145)
Total Protein: 7.1 g/dL (ref 6.0–8.3)

## 2005-10-14 LAB — LACTATE DEHYDROGENASE: LDH: 148 U/L (ref 94–250)

## 2005-12-07 ENCOUNTER — Ambulatory Visit: Payer: Self-pay | Admitting: Oncology

## 2006-02-07 ENCOUNTER — Ambulatory Visit: Payer: Self-pay | Admitting: Oncology

## 2006-02-15 ENCOUNTER — Ambulatory Visit (HOSPITAL_COMMUNITY): Admission: RE | Admit: 2006-02-15 | Discharge: 2006-02-15 | Payer: Self-pay | Admitting: Oncology

## 2006-02-15 LAB — CBC WITH DIFFERENTIAL/PLATELET
Eosinophils Absolute: 0 10*3/uL (ref 0.0–0.5)
MONO#: 0.6 10*3/uL (ref 0.1–0.9)
NEUT#: 4.6 10*3/uL (ref 1.5–6.5)
RBC: 4.48 10*6/uL (ref 4.20–5.71)
RDW: 12.4 % (ref 11.2–14.6)
WBC: 6.7 10*3/uL (ref 4.0–10.0)
lymph#: 1.4 10*3/uL (ref 0.9–3.3)

## 2006-02-15 LAB — COMPREHENSIVE METABOLIC PANEL
ALT: 19 U/L (ref 0–53)
Albumin: 4.3 g/dL (ref 3.5–5.2)
Alkaline Phosphatase: 56 U/L (ref 39–117)
CO2: 21 mEq/L (ref 19–32)
Glucose, Bld: 126 mg/dL — ABNORMAL HIGH (ref 70–99)
Potassium: 3.7 mEq/L (ref 3.5–5.3)
Sodium: 143 mEq/L (ref 135–145)
Total Protein: 7 g/dL (ref 6.0–8.3)

## 2006-02-15 LAB — CEA: CEA: 1.3 ng/mL (ref 0.0–5.0)

## 2006-02-15 IMAGING — CR DG CHEST 2V
2 series · 2 of 2 positions shown · non-contrast
Comparison: [DATE] radiographs.

CLINICAL DATA: Colon cancer.
CHEST ? 2 VIEW:

[view not recorded (1 of 2)]
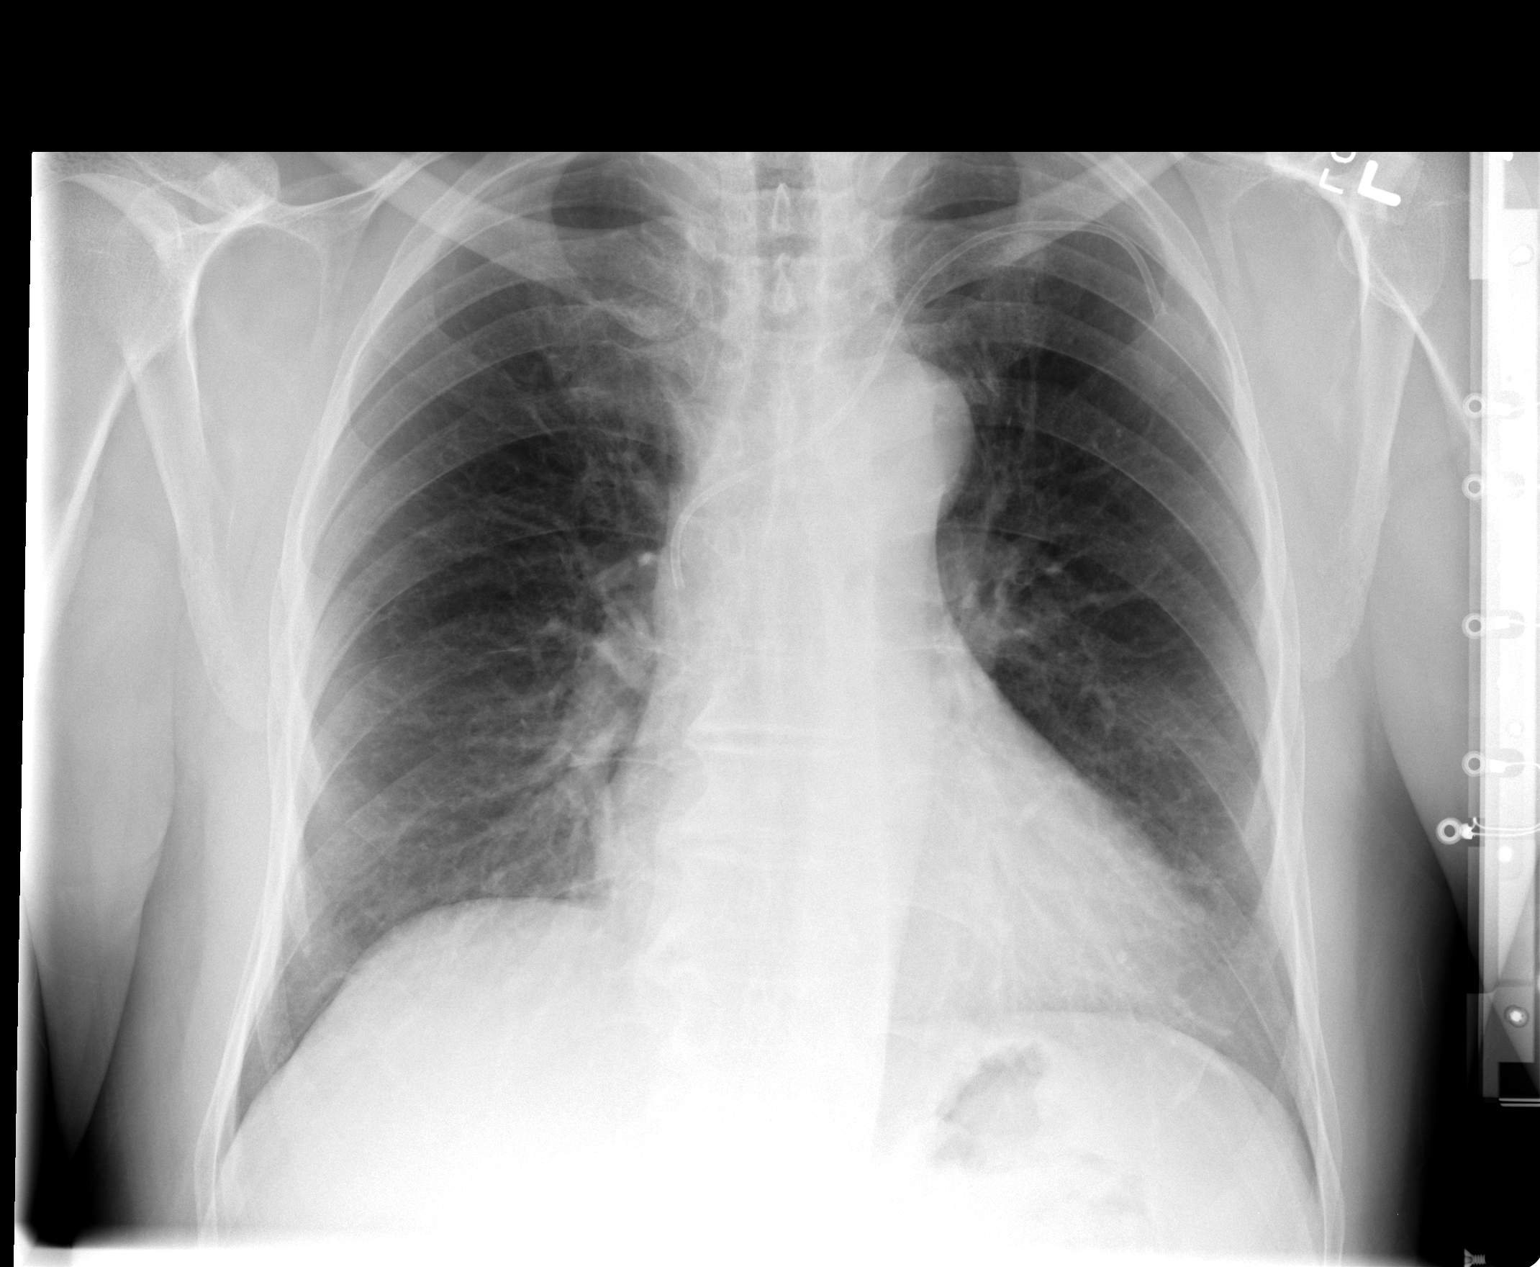

[view not recorded (2 of 2)]
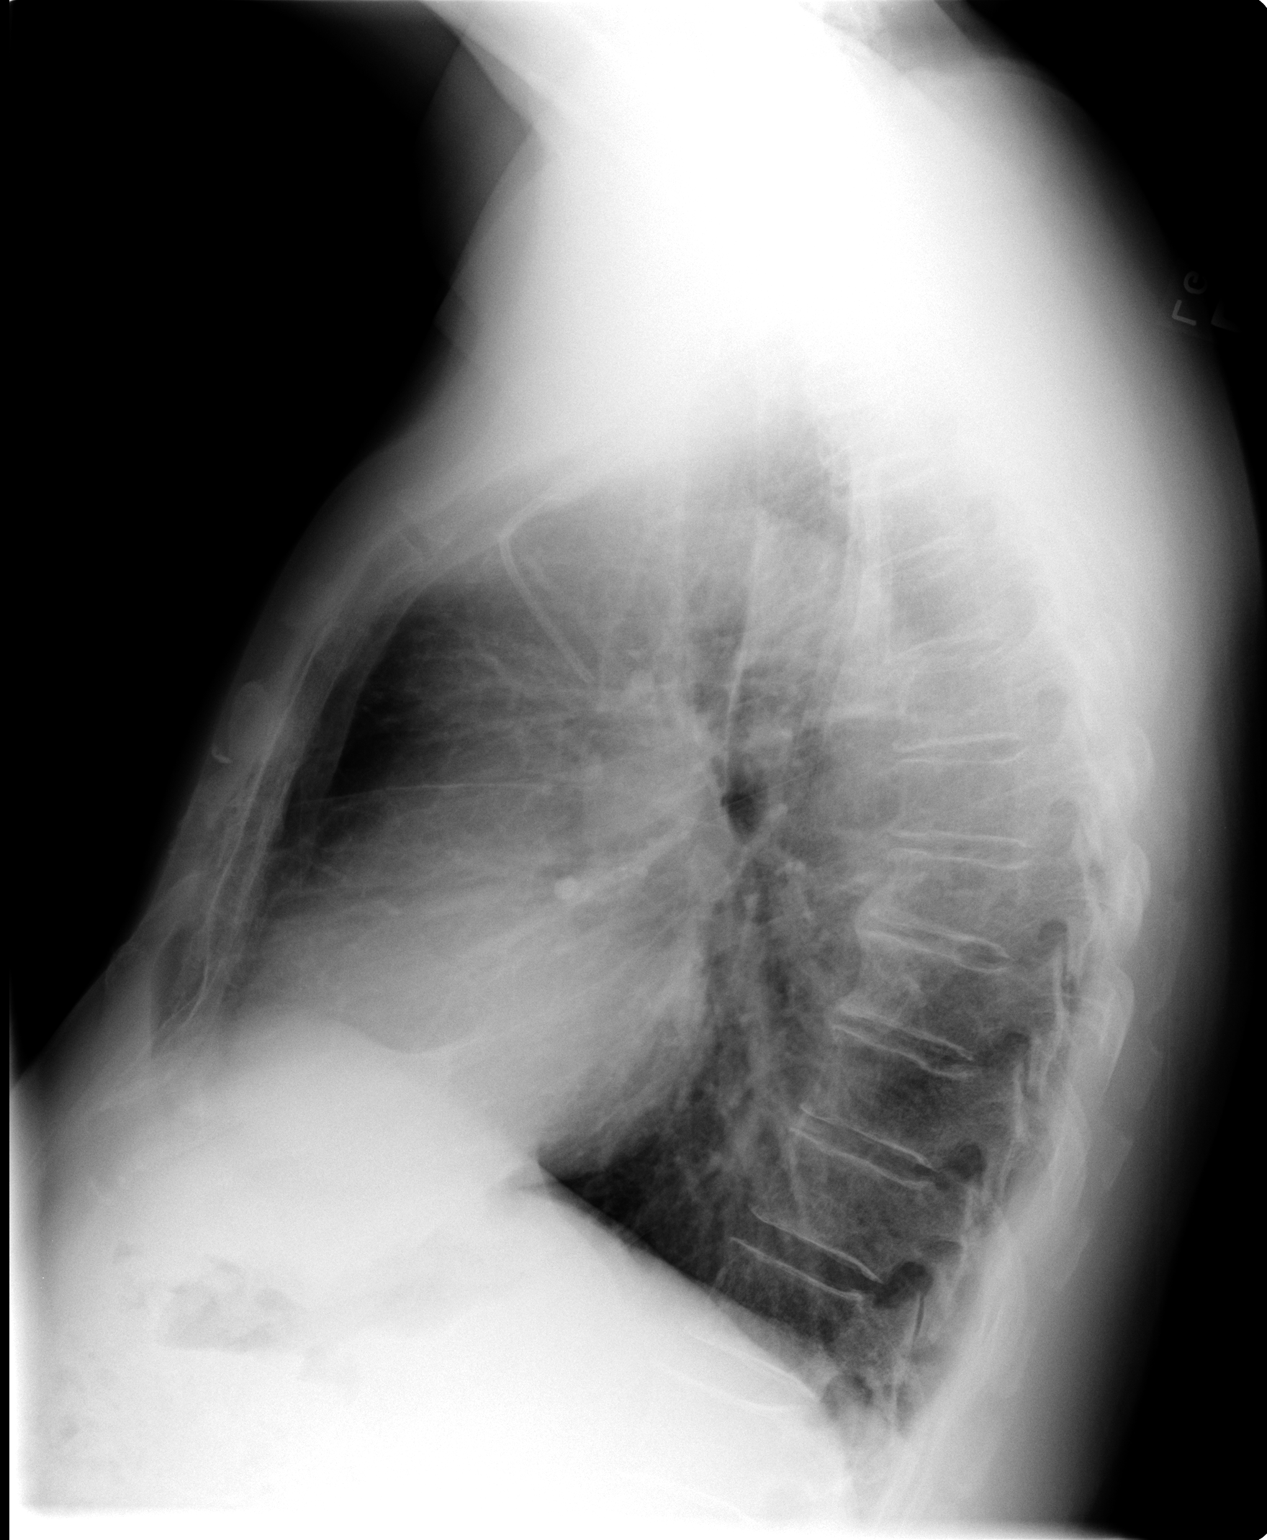

[2 of 2 positions shown; findings below may reference images not displayed]

FINDINGS: Left subclavian Port-A-Cath tip is unchanged in the superior vena cava.  The cardiomediastinal contours are stable with stable mild cardiac enlargement.  The lungs are clear.  There is no pleural effusion.  Scattered thoracic spine osteophytes are noted.
IMPRESSION: Stable examination showing no evidence of metastatic disease or acute findings.  The heart remains mildly enlarged.

## 2006-04-10 ENCOUNTER — Ambulatory Visit: Payer: Self-pay | Admitting: Oncology

## 2006-06-09 ENCOUNTER — Ambulatory Visit: Payer: Self-pay | Admitting: Oncology

## 2006-06-13 LAB — CBC WITH DIFFERENTIAL/PLATELET
Eosinophils Absolute: 0.1 10*3/uL (ref 0.0–0.5)
HCT: 41.3 % (ref 38.7–49.9)
LYMPH%: 22.8 % (ref 14.0–48.0)
MONO#: 0.5 10*3/uL (ref 0.1–0.9)
NEUT#: 5 10*3/uL (ref 1.5–6.5)
NEUT%: 69.2 % (ref 40.0–75.0)
Platelets: 182 10*3/uL (ref 145–400)
WBC: 7.2 10*3/uL (ref 4.0–10.0)
lymph#: 1.6 10*3/uL (ref 0.9–3.3)

## 2006-06-13 LAB — COMPREHENSIVE METABOLIC PANEL
ALT: 15 U/L (ref 0–53)
CO2: 23 mEq/L (ref 19–32)
Calcium: 9 mg/dL (ref 8.4–10.5)
Chloride: 106 mEq/L (ref 96–112)
Creatinine, Ser: 0.84 mg/dL (ref 0.40–1.50)
Glucose, Bld: 157 mg/dL — ABNORMAL HIGH (ref 70–99)
Sodium: 143 mEq/L (ref 135–145)
Total Bilirubin: 0.6 mg/dL (ref 0.3–1.2)
Total Protein: 7.3 g/dL (ref 6.0–8.3)

## 2006-06-13 LAB — LACTATE DEHYDROGENASE: LDH: 136 U/L (ref 94–250)

## 2006-06-13 LAB — CEA: CEA: 0.7 ng/mL (ref 0.0–5.0)

## 2006-06-17 IMAGING — CR DG CHEST 2V
2 series · 2 of 2 positions shown · non-contrast
Comparison: [DATE].

CLINICAL DATA: Colon cancer.  Follow up.
 CHEST - 2 VIEW:

[w chest pa]
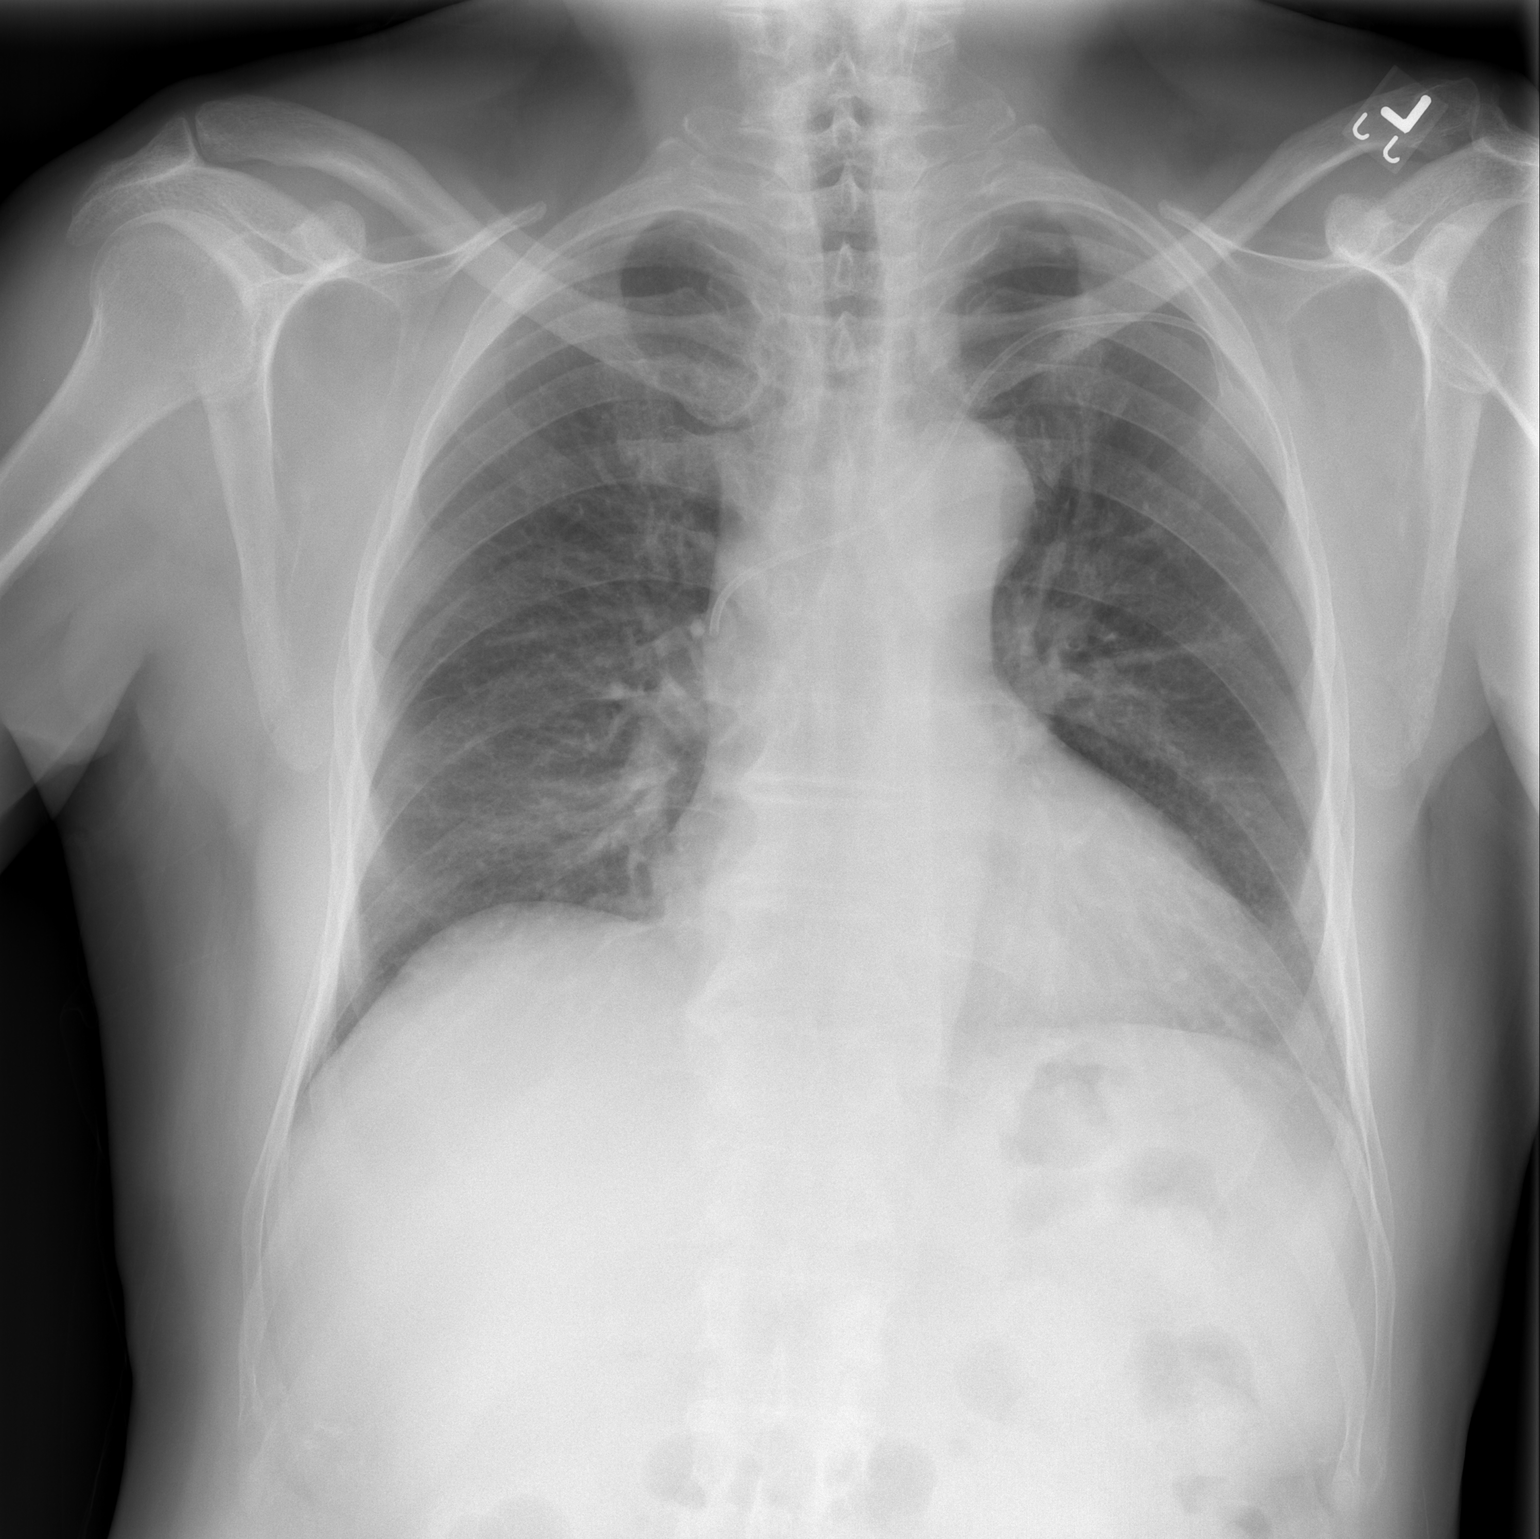

[w chest lat]
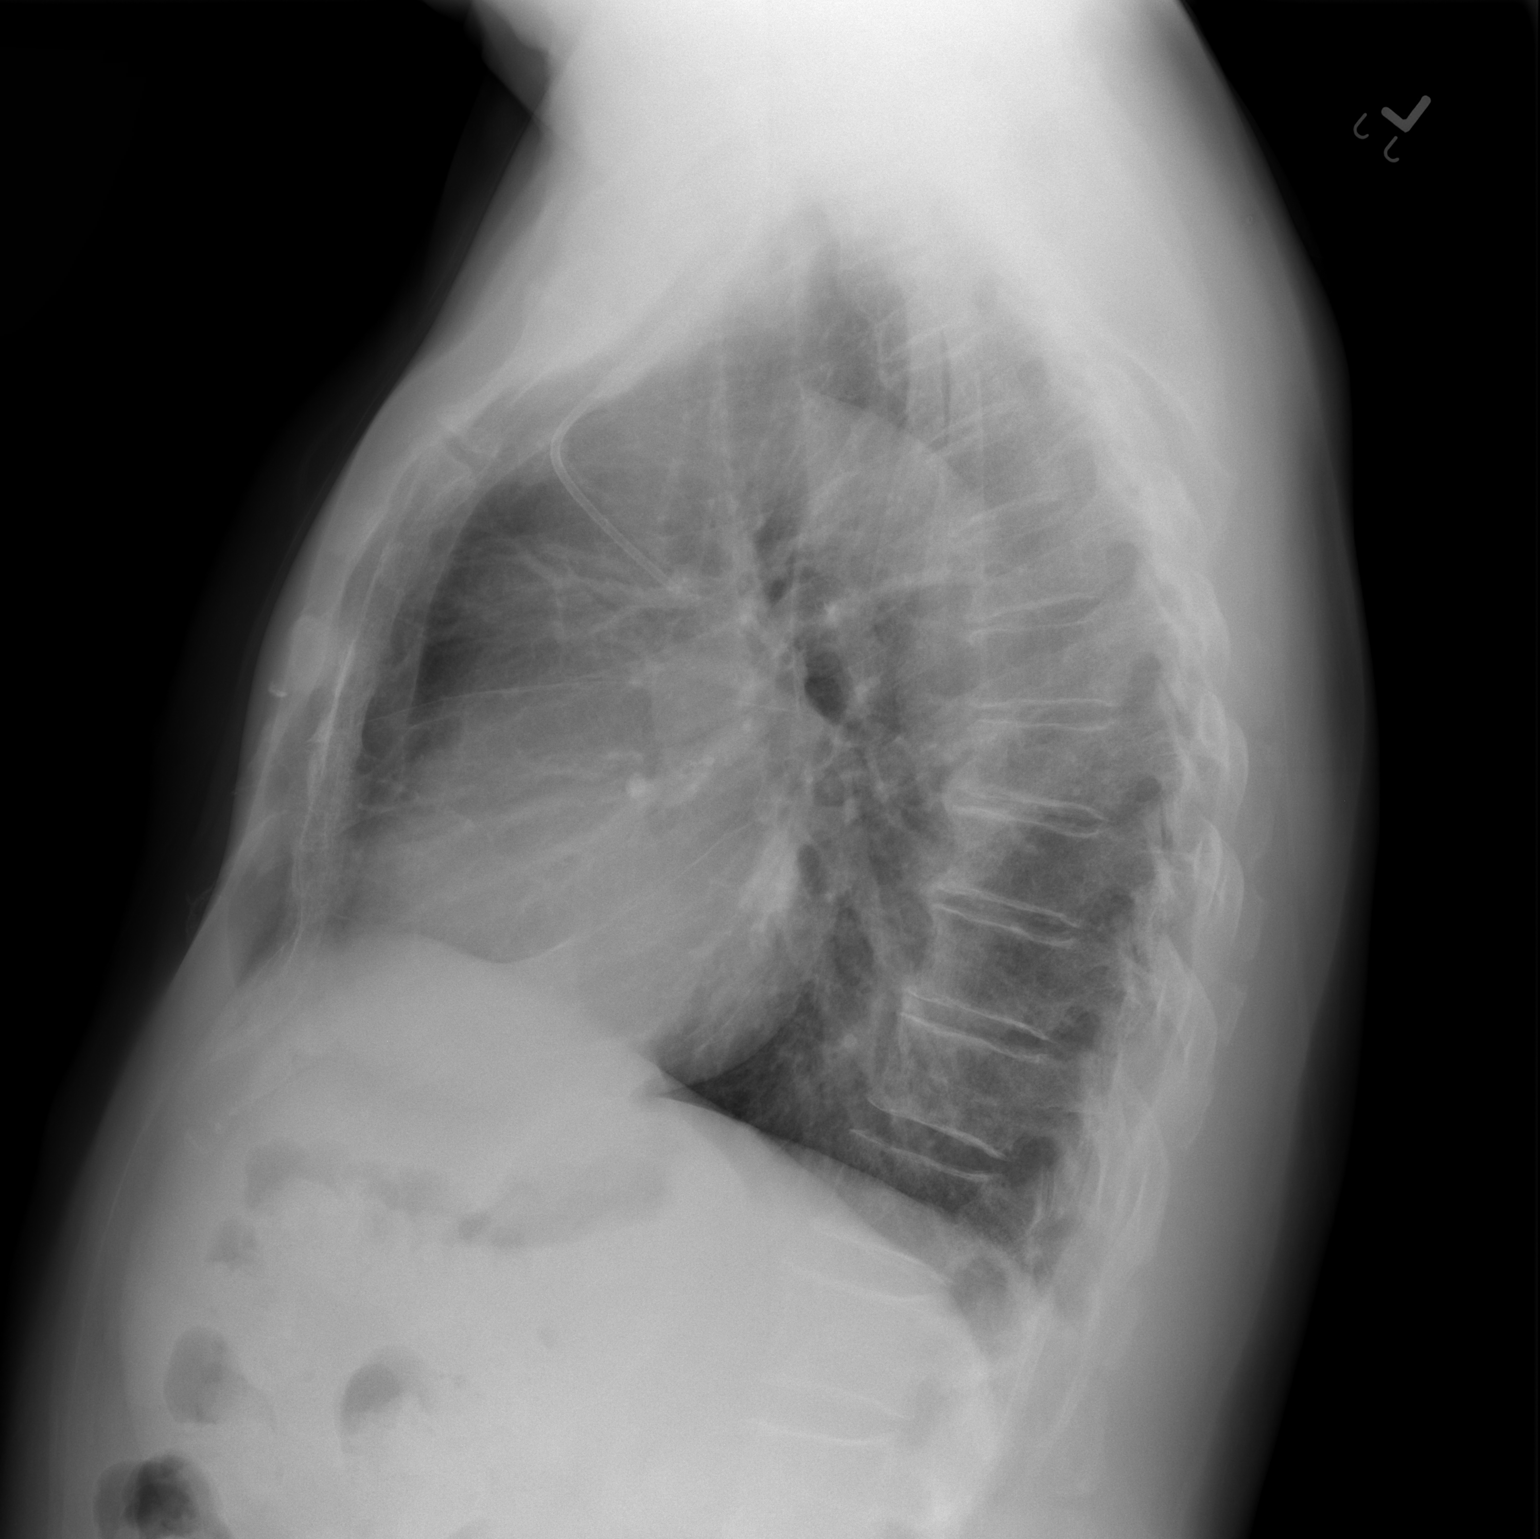

[2 of 2 positions shown; findings below may reference images not displayed]

FINDINGS: Portal venous catheter enters via a left subclavian vein approach, and the tip is in the mid to upper SVC.  Lungs are clear or an active process.  No nodules or masses.  Mild cardiomegaly.  Intact bony thorax.
IMPRESSION: No active chest disease.   No interval change.

## 2006-08-04 ENCOUNTER — Ambulatory Visit: Payer: Self-pay | Admitting: Oncology

## 2006-09-29 ENCOUNTER — Ambulatory Visit: Payer: Self-pay | Admitting: Oncology

## 2006-10-03 LAB — CBC WITH DIFFERENTIAL/PLATELET
Basophils Absolute: 0 10*3/uL (ref 0.0–0.1)
Eosinophils Absolute: 0 10*3/uL (ref 0.0–0.5)
HCT: 41.3 % (ref 38.7–49.9)
HGB: 14.6 g/dL (ref 13.0–17.1)
MCH: 32.9 pg (ref 28.0–33.4)
MONO#: 0.6 10*3/uL (ref 0.1–0.9)
NEUT#: 4.9 10*3/uL (ref 1.5–6.5)
NEUT%: 71.6 % (ref 40.0–75.0)
RDW: 12.8 % (ref 11.2–14.6)
lymph#: 1.3 10*3/uL (ref 0.9–3.3)

## 2006-10-03 LAB — COMPREHENSIVE METABOLIC PANEL
AST: 18 U/L (ref 0–37)
Albumin: 4.7 g/dL (ref 3.5–5.2)
BUN: 10 mg/dL (ref 6–23)
CO2: 22 mEq/L (ref 19–32)
Calcium: 8.8 mg/dL (ref 8.4–10.5)
Chloride: 105 mEq/L (ref 96–112)
Creatinine, Ser: 0.82 mg/dL (ref 0.40–1.50)
Glucose, Bld: 127 mg/dL — ABNORMAL HIGH (ref 70–99)
Potassium: 3.8 mEq/L (ref 3.5–5.3)

## 2006-10-03 LAB — CEA: CEA: 1 ng/mL (ref 0.0–5.0)

## 2006-10-03 LAB — LACTATE DEHYDROGENASE: LDH: 145 U/L (ref 94–250)

## 2006-11-30 ENCOUNTER — Ambulatory Visit: Payer: Self-pay | Admitting: Oncology

## 2007-01-31 ENCOUNTER — Ambulatory Visit: Payer: Self-pay | Admitting: Oncology

## 2007-02-02 ENCOUNTER — Ambulatory Visit (HOSPITAL_COMMUNITY): Admission: RE | Admit: 2007-02-02 | Discharge: 2007-02-02 | Payer: Self-pay | Admitting: Oncology

## 2007-02-02 LAB — COMPREHENSIVE METABOLIC PANEL
ALT: 21 U/L (ref 0–53)
Alkaline Phosphatase: 55 U/L (ref 39–117)
Sodium: 140 mEq/L (ref 135–145)
Total Bilirubin: 0.6 mg/dL (ref 0.3–1.2)
Total Protein: 6.9 g/dL (ref 6.0–8.3)

## 2007-02-02 LAB — CBC WITH DIFFERENTIAL/PLATELET
EOS%: 0.5 % (ref 0.0–7.0)
MCHC: 35.2 g/dL (ref 32.0–35.9)
MONO#: 0.8 10*3/uL (ref 0.1–0.9)
NEUT#: 5.1 10*3/uL (ref 1.5–6.5)
Platelets: 159 10*3/uL (ref 145–400)
RBC: 4.61 10*6/uL (ref 4.20–5.71)
RDW: 12.7 % (ref 11.2–14.6)
WBC: 7.4 10*3/uL (ref 4.0–10.0)

## 2007-04-03 ENCOUNTER — Ambulatory Visit: Payer: Self-pay | Admitting: Oncology

## 2007-05-31 ENCOUNTER — Ambulatory Visit: Payer: Self-pay | Admitting: Oncology

## 2007-06-04 LAB — CBC WITH DIFFERENTIAL/PLATELET
BASO%: 0.2 % (ref 0.0–2.0)
EOS%: 0.7 % (ref 0.0–7.0)
HCT: 42 % (ref 38.7–49.9)
LYMPH%: 22.1 % (ref 14.0–48.0)
MCH: 32.5 pg (ref 28.0–33.4)
MCHC: 35.2 g/dL (ref 32.0–35.9)
MONO#: 0.5 10*3/uL (ref 0.1–0.9)
NEUT%: 68.2 % (ref 40.0–75.0)
Platelets: 169 10*3/uL (ref 145–400)
RBC: 4.55 10*6/uL (ref 4.20–5.71)
WBC: 6.1 10*3/uL (ref 4.0–10.0)
lymph#: 1.3 10*3/uL (ref 0.9–3.3)

## 2007-06-04 LAB — COMPREHENSIVE METABOLIC PANEL
ALT: 19 U/L (ref 0–53)
AST: 15 U/L (ref 0–37)
CO2: 25 mEq/L (ref 19–32)
Creatinine, Ser: 0.75 mg/dL (ref 0.40–1.50)
Sodium: 140 mEq/L (ref 135–145)
Total Bilirubin: 0.9 mg/dL (ref 0.3–1.2)
Total Protein: 7 g/dL (ref 6.0–8.3)

## 2007-06-04 LAB — LACTATE DEHYDROGENASE: LDH: 124 U/L (ref 94–250)

## 2007-06-25 ENCOUNTER — Ambulatory Visit (HOSPITAL_COMMUNITY): Admission: RE | Admit: 2007-06-25 | Discharge: 2007-06-25 | Payer: Self-pay | Admitting: Oncology

## 2007-06-25 IMAGING — CT CT ABDOMEN W/ CM
1 of 3 series · 13 of 32 positions shown, 18 images · IV contrast (agent unspecified)
Comparison: [DATE] and [DATE] and [DATE]

CT ABDOMEN

CLINICAL DATA: Colon cancer

CT ABDOMEN AND PELVIS WITH CONTRAST
TECHNIQUE: Multidetector CT imaging of the abdomen and pelvis was
performed using the standard protocol following bolus
administration of intravenous contrast.
Contrast: 100 ml [7L]

[Series 2: abd_pel 5.0 b40f st · axial · 0.72mm/px · z∈[-496,-61]mm · 13 of 99 slices shown, 18 images]
[im 6/99  soft-tissue]
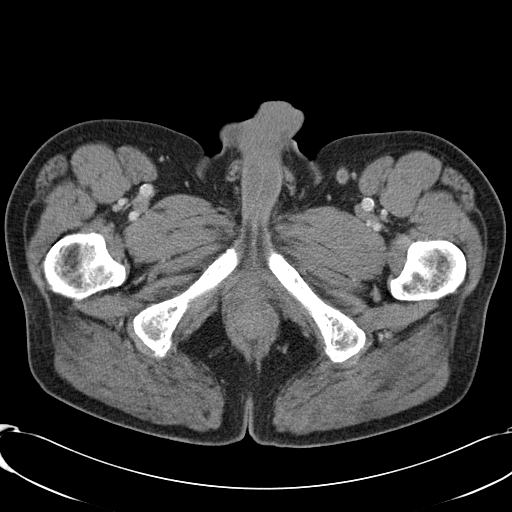
[im 6/99  bone]
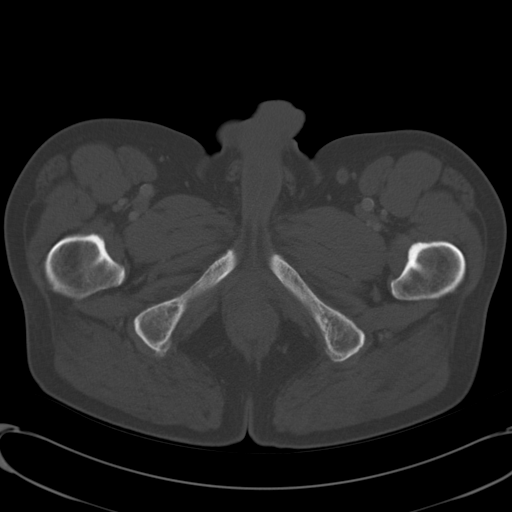
[im 16/99  soft-tissue]
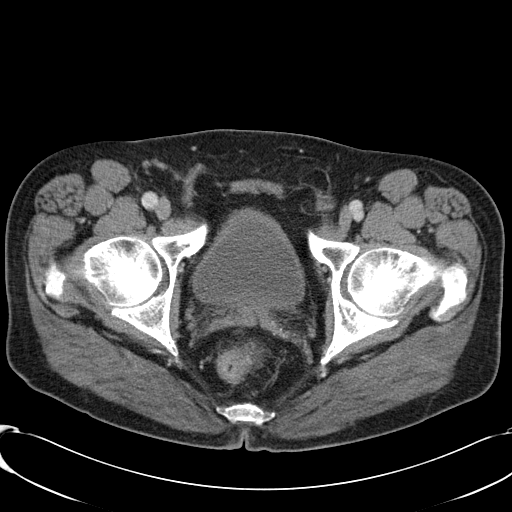
[im 21/99  soft-tissue]
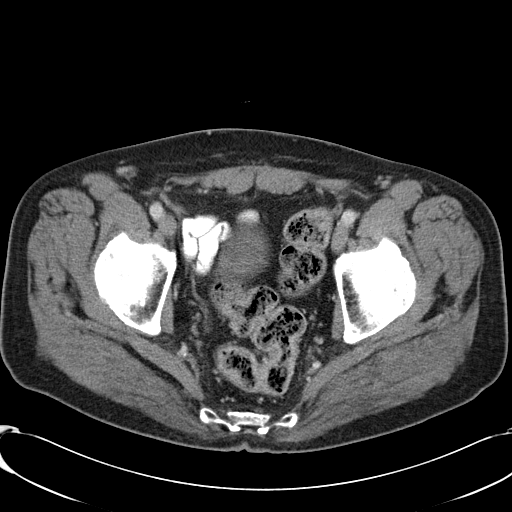
[im 31/99  soft-tissue]
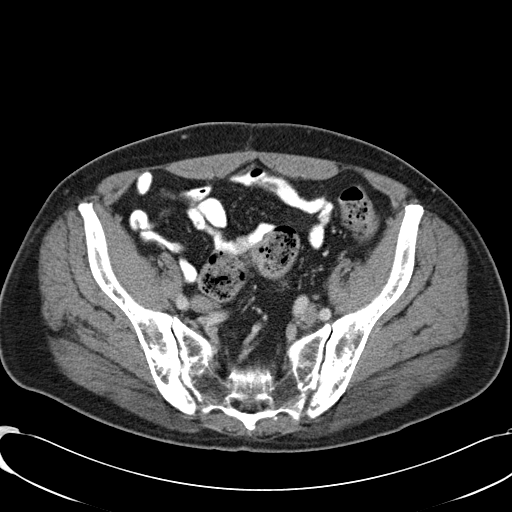
[im 37/99  soft-tissue]
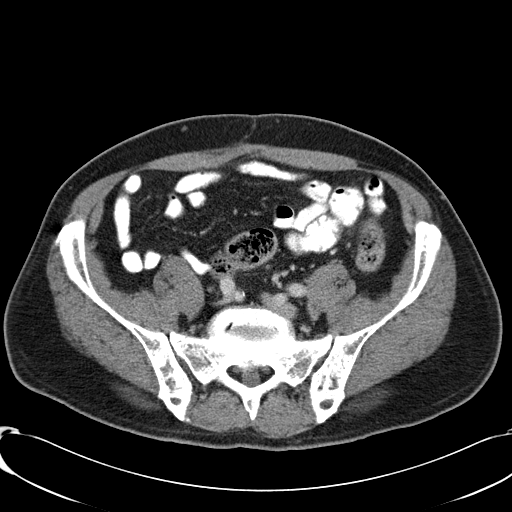
[im 47/99  soft-tissue]
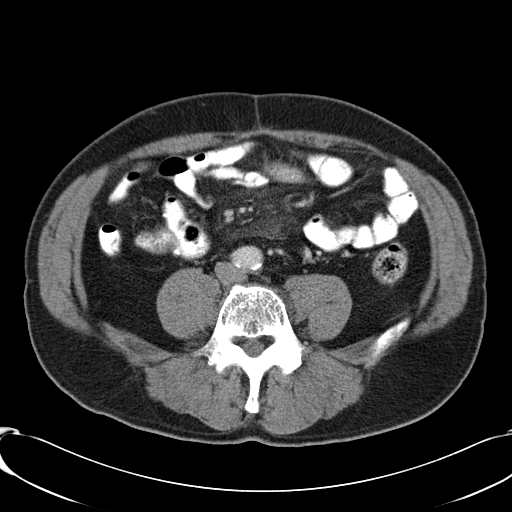
[im 52/99  soft-tissue]
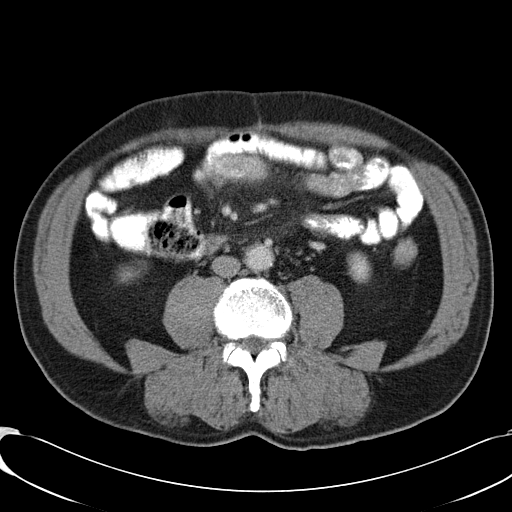
[im 62/99  soft-tissue]
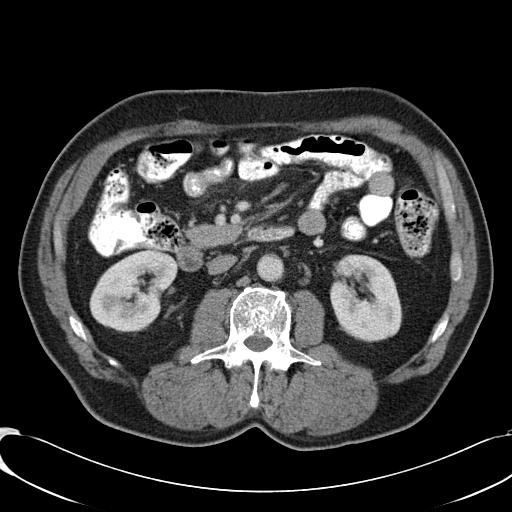
[im 68/99  soft-tissue]
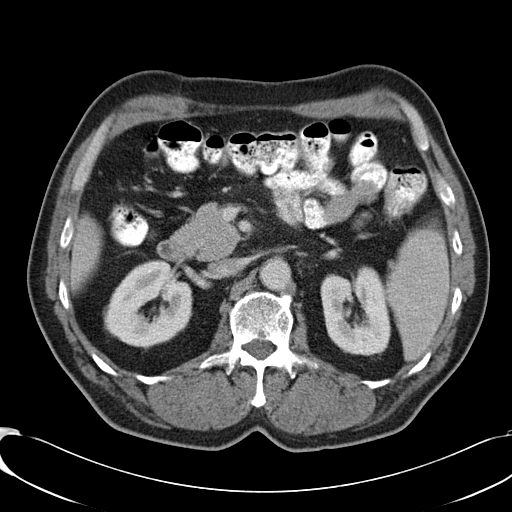
[im 68/99  bone]
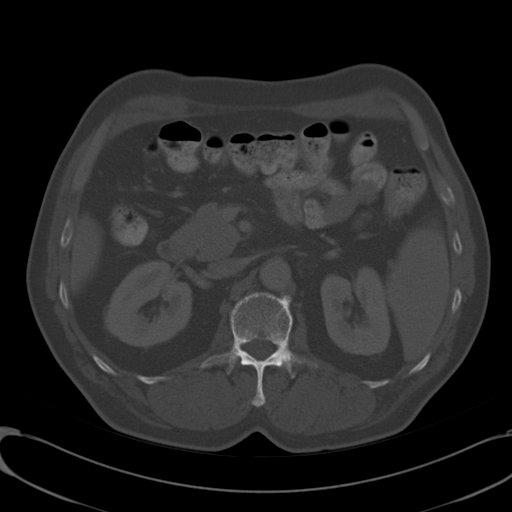
[im 78/99  soft-tissue]
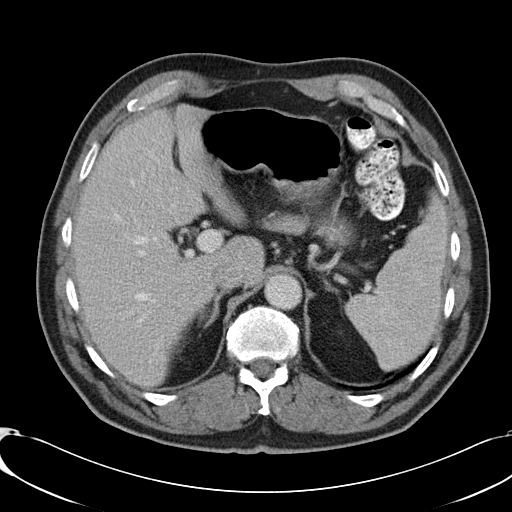
[im 78/99  lung]
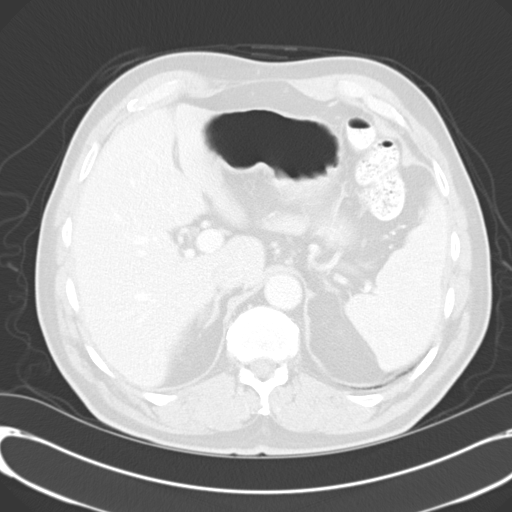
[im 83/99  soft-tissue]
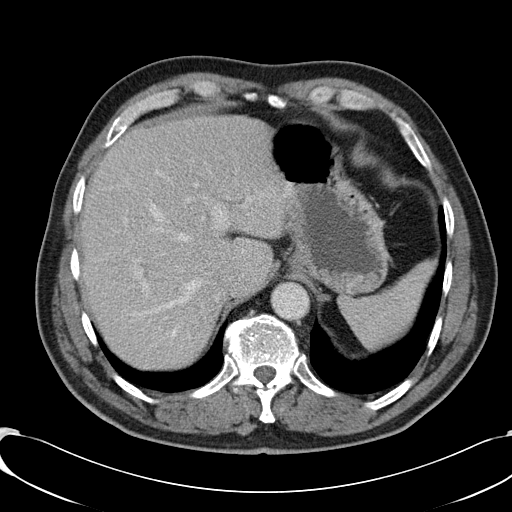
[im 83/99  lung]
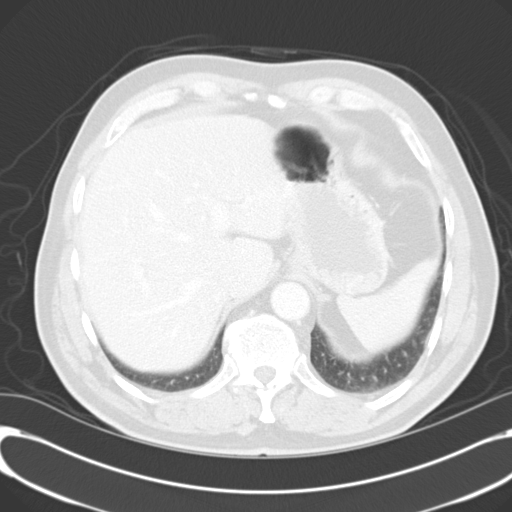
[im 88/99  lung]
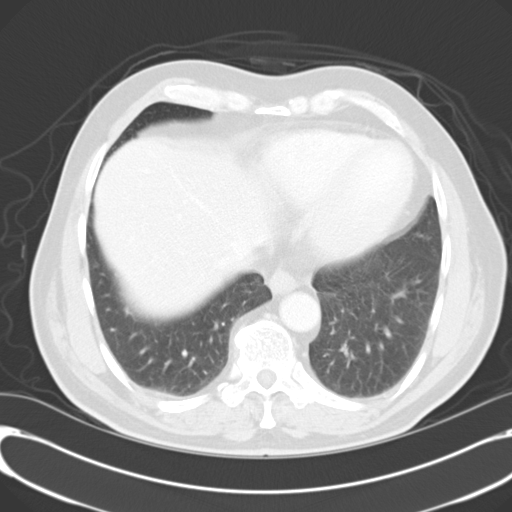
[im 93/99  soft-tissue]
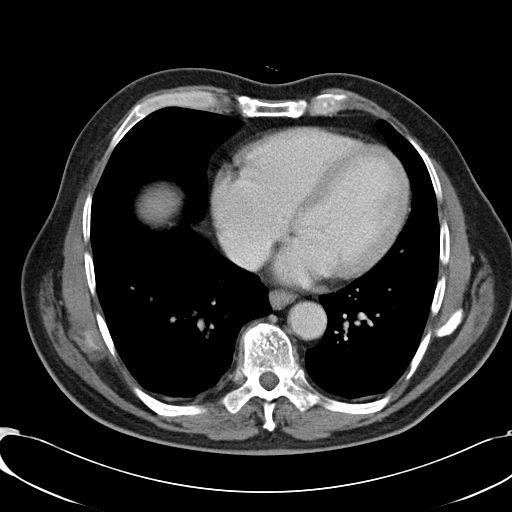
[im 93/99  lung]
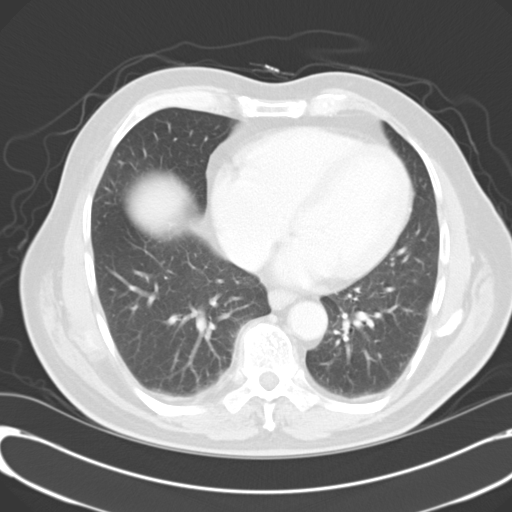

[13 of 32 positions shown; findings below may reference images not displayed]

FINDINGS: There is a subtle 3 mm nodule along the left major
fissure on sequence #4, image 3.  This small nodule is probably
stable since [7L].  There is an additional stable punctate density
at the lingula base on image 10.  No evidence for free air.  Stable
appearance of the liver and portal venous system without focal
lesions.  There is stable appearance of the spleen, pancreas, left
adrenal gland and kidneys.  There is a stable nodule involving the
right adrenal gland that measures up to 1.4 cm.  The small size and
stability of this lesion is suggestive for a benign adenoma.  There
is stable stranding throughout the root of the mesentery but there
is no significant abdominal lymphadenopathy or mesenteric
adenopathy.  There are stable postoperative changes along the right
abdomen, probably representing a partial right hemicolectomy.  No
evidence for bowel obstruction.  There are no acute bony
abnormalities. There is a short segment of small bowel wall
thickening on sequence #2, image 50.  This small bowel appearance
may be related to under distension or peristalsis.
IMPRESSION: Stable CT of the abdomen.  No evidence for metastatic disease.

Short segment of possible small bowel wall thickening.  This may
represent under distension or peristalsis.  Recommend attention to
this area on followup imaging or this could be followed with CT
enterography if this is an area of clinical concern.

Stable postop changes and persistent of stranding throughout the
mesentery.

Stable punctate nodular densities in the left lung base. Stable
right adrenal nodule that likely represents an adenoma as
described.

CT PELVIS
FINDINGS: There is stool throughout the colon.  There is no
significant free fluid or lymphadenopathy in the pelvis.  Stable
appearance of small left inguinal hernia containing fat.  Stable
appearance of the prostate and urinary bladder.  Again seen is
extensive osteopenia throughout the pelvic bones.  There are no
acute bony abnormalities.
IMPRESSION: Stable CT of the pelvis.  No evidence for metastatic disease.

## 2007-10-03 ENCOUNTER — Ambulatory Visit: Payer: Self-pay | Admitting: Oncology

## 2007-10-08 LAB — COMPREHENSIVE METABOLIC PANEL
Albumin: 4.7 g/dL (ref 3.5–5.2)
BUN: 9 mg/dL (ref 6–23)
CO2: 25 mEq/L (ref 19–32)
Calcium: 9.4 mg/dL (ref 8.4–10.5)
Chloride: 106 mEq/L (ref 96–112)
Glucose, Bld: 120 mg/dL — ABNORMAL HIGH (ref 70–99)
Potassium: 3.7 mEq/L (ref 3.5–5.3)
Sodium: 141 mEq/L (ref 135–145)
Total Protein: 7.2 g/dL (ref 6.0–8.3)

## 2007-10-08 LAB — CBC WITH DIFFERENTIAL/PLATELET
Basophils Absolute: 0 10*3/uL (ref 0.0–0.1)
Eosinophils Absolute: 0 10*3/uL (ref 0.0–0.5)
HGB: 14.6 g/dL (ref 13.0–17.1)
MCV: 94.2 fL (ref 81.6–98.0)
MONO#: 0.8 10*3/uL (ref 0.1–0.9)
NEUT#: 5.6 10*3/uL (ref 1.5–6.5)
RBC: 4.41 10*6/uL (ref 4.20–5.71)
RDW: 12.8 % (ref 11.2–14.6)
WBC: 7.8 10*3/uL (ref 4.0–10.0)
lymph#: 1.4 10*3/uL (ref 0.9–3.3)

## 2007-10-08 LAB — CEA: CEA: 0.7 ng/mL (ref 0.0–5.0)

## 2008-04-03 ENCOUNTER — Ambulatory Visit: Payer: Self-pay | Admitting: Oncology

## 2008-04-07 ENCOUNTER — Ambulatory Visit (HOSPITAL_COMMUNITY): Admission: RE | Admit: 2008-04-07 | Discharge: 2008-04-07 | Payer: Self-pay | Admitting: Oncology

## 2008-04-07 LAB — LACTATE DEHYDROGENASE: LDH: 142 U/L (ref 94–250)

## 2008-04-07 LAB — CBC WITH DIFFERENTIAL/PLATELET
EOS%: 0.3 % (ref 0.0–7.0)
Eosinophils Absolute: 0 10*3/uL (ref 0.0–0.5)
MCV: 93.7 fL (ref 79.3–98.0)
MONO%: 6.7 % (ref 0.0–14.0)
NEUT#: 4.3 10*3/uL (ref 1.5–6.5)
RBC: 4.69 10*6/uL (ref 4.20–5.82)
RDW: 12.8 % (ref 11.0–14.6)
lymph#: 1.5 10*3/uL (ref 0.9–3.3)

## 2008-04-07 LAB — COMPREHENSIVE METABOLIC PANEL
ALT: 18 U/L (ref 0–53)
BUN: 8 mg/dL (ref 6–23)
CO2: 23 mEq/L (ref 19–32)
Calcium: 9.2 mg/dL (ref 8.4–10.5)
Creatinine, Ser: 0.81 mg/dL (ref 0.40–1.50)
Potassium: 3.8 mEq/L (ref 3.5–5.3)
Sodium: 142 mEq/L (ref 135–145)
Total Bilirubin: 0.7 mg/dL (ref 0.3–1.2)

## 2008-04-07 LAB — CEA: CEA: 0.7 ng/mL (ref 0.0–5.0)

## 2008-04-07 IMAGING — CR DG CHEST 2V
2 series · 2 of 2 positions shown · non-contrast
Comparison: [DATE]

CLINICAL DATA: Colon cancer

CHEST - 2 VIEW

[w chest pa]
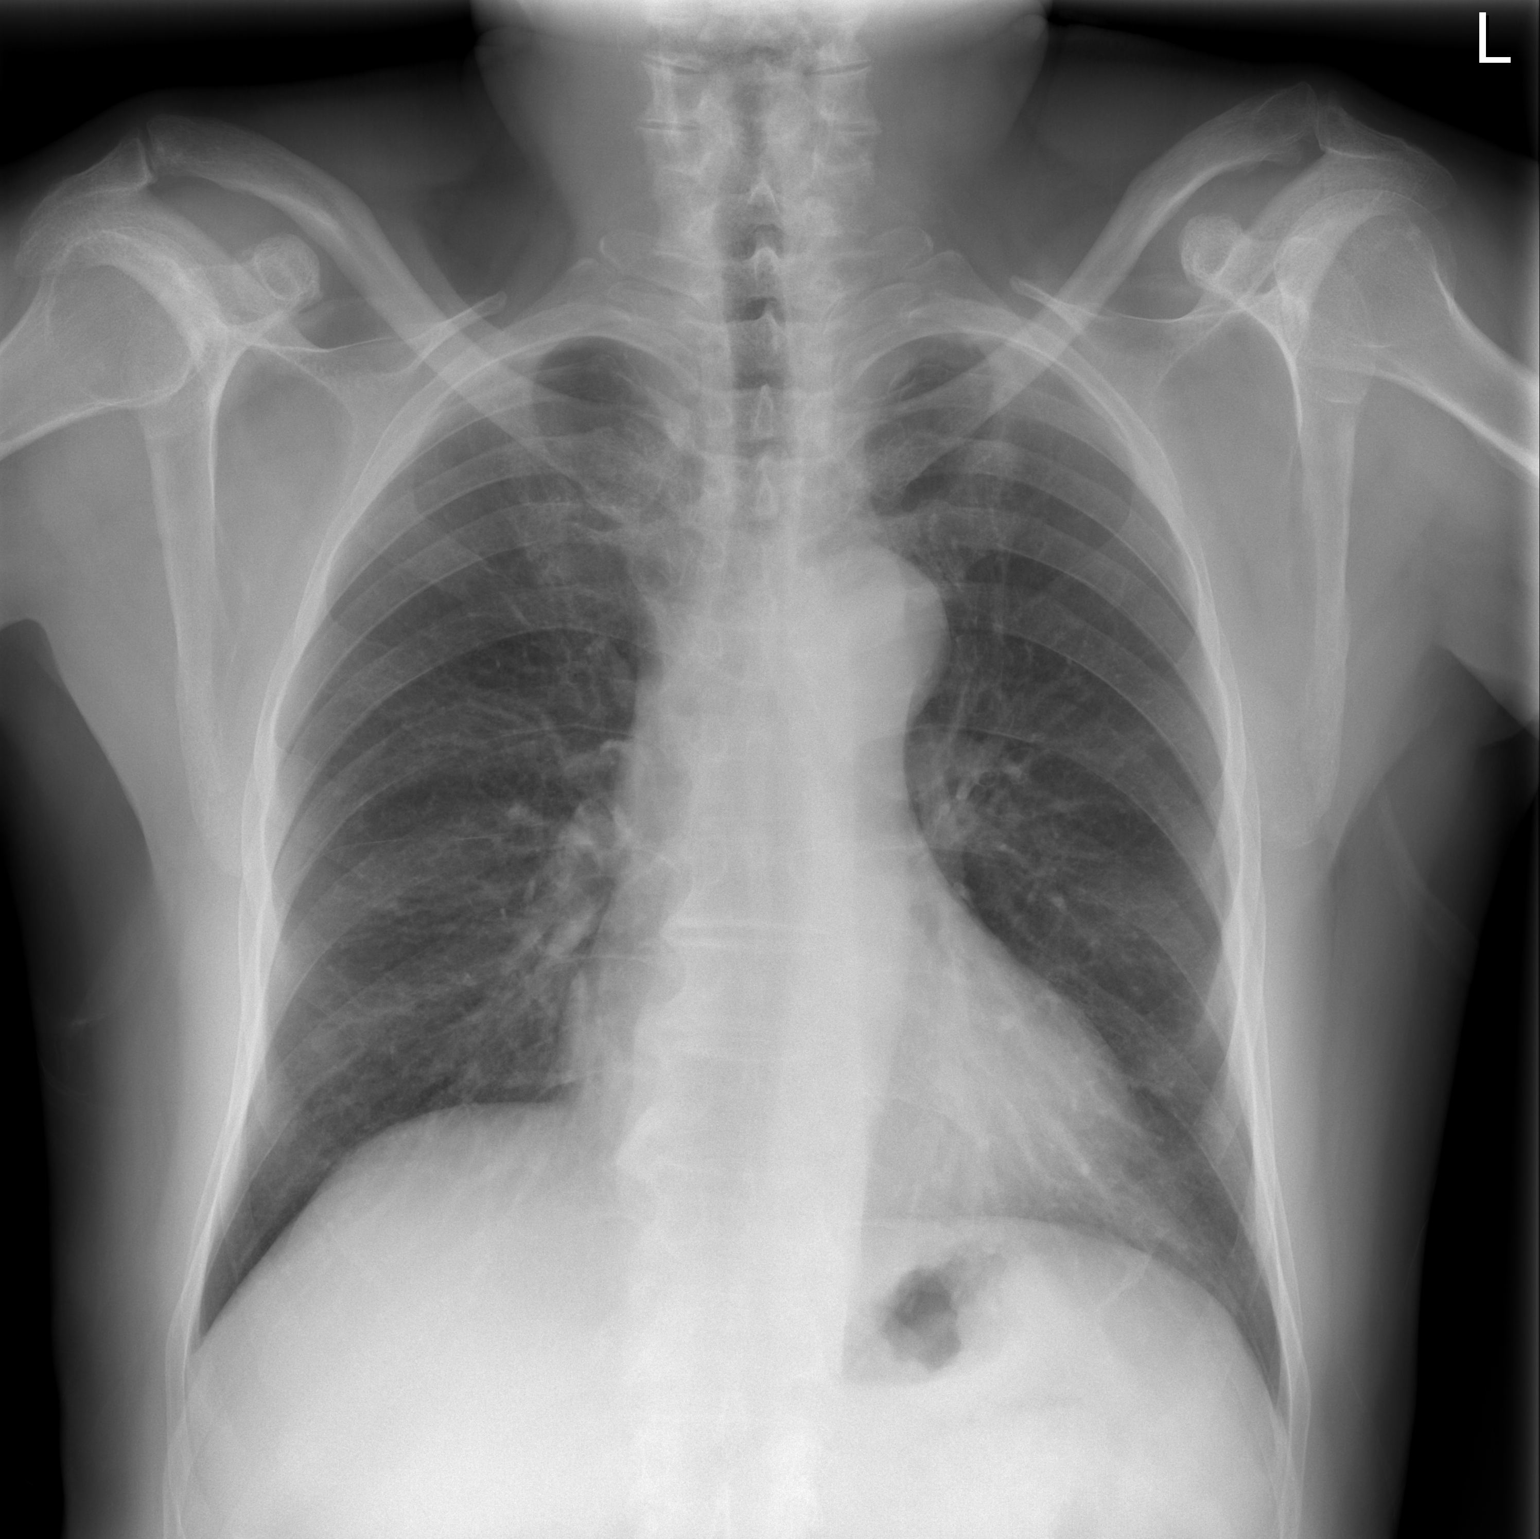

[w chest lat]
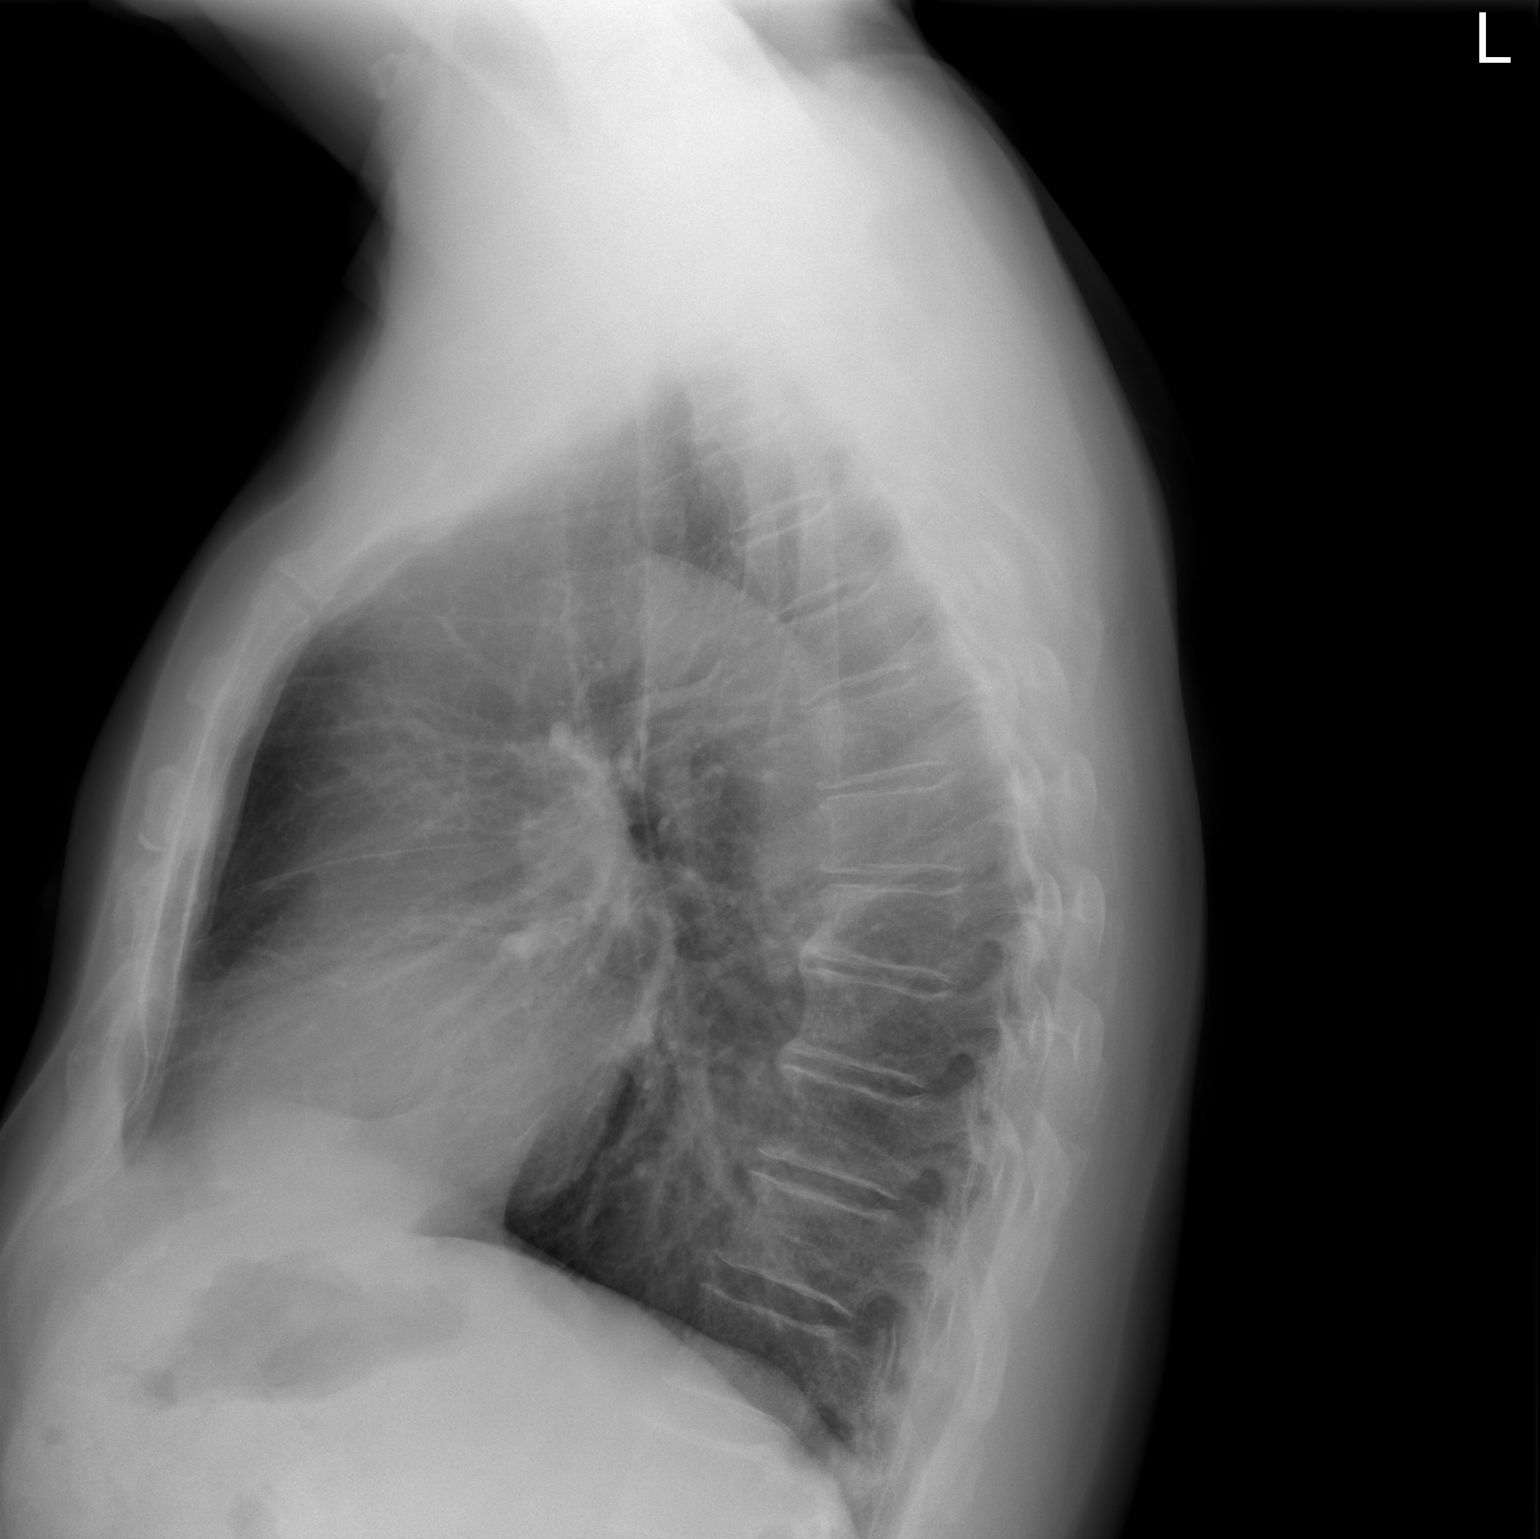

[2 of 2 positions shown; findings below may reference images not displayed]

FINDINGS: Thoracic spondylosis noted.  Mild cardiomegaly is
present.  There is no evidence of edema or pleural effusion.

Thoracic spondylosis is present.  Bilaterally symmetric nodular
densities most compatible with nipple shadows are identified.
IMPRESSION: 1.  Stable mild cardiomegaly.  No acute findings.  No specific
findings of metastatic feces to the chest identified.
2.  Thoracic spondylosis.

## 2008-10-07 ENCOUNTER — Ambulatory Visit: Payer: Self-pay | Admitting: Oncology

## 2008-10-09 LAB — COMPREHENSIVE METABOLIC PANEL
ALT: 16 U/L (ref 0–53)
BUN: 10 mg/dL (ref 6–23)
CO2: 25 mEq/L (ref 19–32)
Calcium: 8.9 mg/dL (ref 8.4–10.5)
Chloride: 106 mEq/L (ref 96–112)
Creatinine, Ser: 0.82 mg/dL (ref 0.40–1.50)
Glucose, Bld: 115 mg/dL — ABNORMAL HIGH (ref 70–99)
Total Bilirubin: 0.6 mg/dL (ref 0.3–1.2)

## 2008-10-09 LAB — CBC WITH DIFFERENTIAL/PLATELET
BASO%: 0.3 % (ref 0.0–2.0)
Basophils Absolute: 0 10*3/uL (ref 0.0–0.1)
Eosinophils Absolute: 0 10*3/uL (ref 0.0–0.5)
HCT: 42.5 % (ref 38.4–49.9)
HGB: 14.9 g/dL (ref 13.0–17.1)
LYMPH%: 20.2 % (ref 14.0–49.0)
MONO#: 0.7 10*3/uL (ref 0.1–0.9)
NEUT%: 70 % (ref 39.0–75.0)
Platelets: 181 10*3/uL (ref 140–400)
WBC: 7.5 10*3/uL (ref 4.0–10.3)
lymph#: 1.5 10*3/uL (ref 0.9–3.3)

## 2008-10-09 LAB — LACTATE DEHYDROGENASE: LDH: 135 U/L (ref 94–250)

## 2008-10-09 LAB — CEA: CEA: 1 ng/mL (ref 0.0–5.0)

## 2009-04-07 ENCOUNTER — Ambulatory Visit: Payer: Self-pay | Admitting: Oncology

## 2009-04-09 LAB — CBC WITH DIFFERENTIAL/PLATELET
BASO%: 0.1 % (ref 0.0–2.0)
Basophils Absolute: 0 10*3/uL (ref 0.0–0.1)
EOS%: 0.7 % (ref 0.0–7.0)
Eosinophils Absolute: 0.1 10*3/uL (ref 0.0–0.5)
HCT: 44.1 % (ref 38.4–49.9)
HGB: 15.2 g/dL (ref 13.0–17.1)
LYMPH%: 27.2 % (ref 14.0–49.0)
MCH: 32.3 pg (ref 27.2–33.4)
MCHC: 34.5 g/dL (ref 32.0–36.0)
MCV: 93.8 fL (ref 79.3–98.0)
MONO#: 0.6 10*3/uL (ref 0.1–0.9)
MONO%: 9.4 % (ref 0.0–14.0)
NEUT#: 4.2 10*3/uL (ref 1.5–6.5)
NEUT%: 62.6 % (ref 39.0–75.0)
Platelets: 156 10*3/uL (ref 140–400)
RBC: 4.7 10*6/uL (ref 4.20–5.82)
RDW: 12.7 % (ref 11.0–14.6)
WBC: 6.7 10*3/uL (ref 4.0–10.3)
lymph#: 1.8 10*3/uL (ref 0.9–3.3)

## 2009-04-09 LAB — COMPREHENSIVE METABOLIC PANEL
AST: 16 U/L (ref 0–37)
Albumin: 4.8 g/dL (ref 3.5–5.2)
Alkaline Phosphatase: 57 U/L (ref 39–117)
BUN: 11 mg/dL (ref 6–23)
Potassium: 4.1 mEq/L (ref 3.5–5.3)
Sodium: 140 mEq/L (ref 135–145)
Total Protein: 7.5 g/dL (ref 6.0–8.3)

## 2009-10-07 ENCOUNTER — Ambulatory Visit: Payer: Self-pay | Admitting: Oncology

## 2009-10-12 LAB — CBC WITH DIFFERENTIAL/PLATELET
BASO%: 0.2 % (ref 0.0–2.0)
Basophils Absolute: 0 10*3/uL (ref 0.0–0.1)
EOS%: 0.3 % (ref 0.0–7.0)
Eosinophils Absolute: 0 10*3/uL (ref 0.0–0.5)
HCT: 43.8 % (ref 38.4–49.9)
HGB: 15.1 g/dL (ref 13.0–17.1)
LYMPH%: 19.5 % (ref 14.0–49.0)
MCH: 32.7 pg (ref 27.2–33.4)
MCHC: 34.5 g/dL (ref 32.0–36.0)
MCV: 94.9 fL (ref 79.3–98.0)
MONO#: 0.8 10*3/uL (ref 0.1–0.9)
MONO%: 10.8 % (ref 0.0–14.0)
NEUT#: 5.2 10*3/uL (ref 1.5–6.5)
NEUT%: 69.2 % (ref 39.0–75.0)
Platelets: 191 10*3/uL (ref 140–400)
RBC: 4.61 10*6/uL (ref 4.20–5.82)
RDW: 12.8 % (ref 11.0–14.6)
WBC: 7.5 10*3/uL (ref 4.0–10.3)
lymph#: 1.5 10*3/uL (ref 0.9–3.3)

## 2009-10-13 LAB — COMPREHENSIVE METABOLIC PANEL
ALT: 19 U/L (ref 0–53)
AST: 19 U/L (ref 0–37)
Albumin: 4.8 g/dL (ref 3.5–5.2)
Alkaline Phosphatase: 60 U/L (ref 39–117)
BUN: 9 mg/dL (ref 6–23)
CO2: 26 mEq/L (ref 19–32)
Calcium: 9.6 mg/dL (ref 8.4–10.5)
Chloride: 102 mEq/L (ref 96–112)
Creatinine, Ser: 0.72 mg/dL (ref 0.40–1.50)
Glucose, Bld: 94 mg/dL (ref 70–99)
Potassium: 4.1 mEq/L (ref 3.5–5.3)
Sodium: 140 mEq/L (ref 135–145)
Total Bilirubin: 0.8 mg/dL (ref 0.3–1.2)
Total Protein: 7.2 g/dL (ref 6.0–8.3)

## 2009-10-13 LAB — CEA: CEA: 1 ng/mL (ref 0.0–5.0)

## 2009-10-13 LAB — LACTATE DEHYDROGENASE: LDH: 147 U/L (ref 94–250)

## 2009-12-24 ENCOUNTER — Inpatient Hospital Stay (HOSPITAL_COMMUNITY)
Admission: EM | Admit: 2009-12-24 | Discharge: 2009-12-28 | Payer: Self-pay | Source: Home / Self Care | Admitting: Emergency Medicine

## 2009-12-24 IMAGING — CR DG CHEST 1V PORT
1 series · 1 of 1 positions shown · non-contrast
Comparison: [DATE]

CLINICAL DATA: Nausea vomiting and diarrhea.  Abdominal pain.

PORTABLE CHEST - 1 VIEW

[AP]
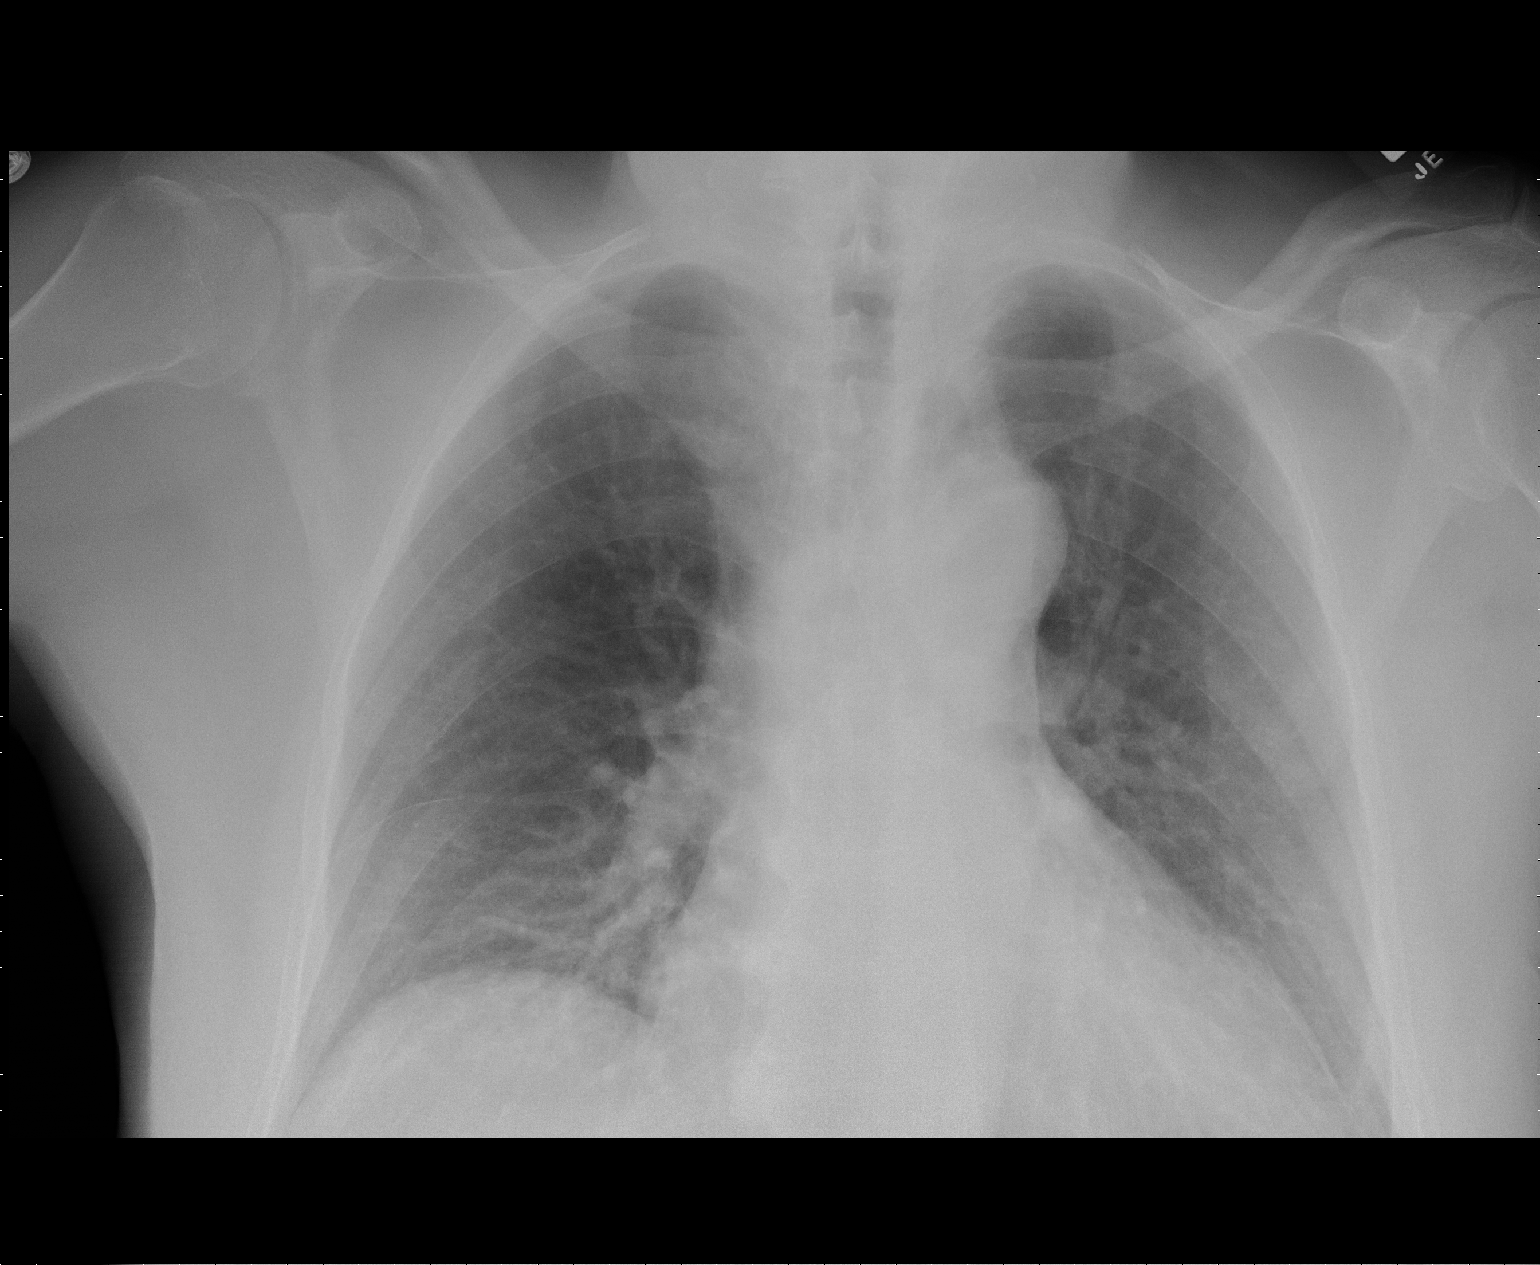

[1 of 1 positions shown; findings below may reference images not displayed]

FINDINGS: Midline trachea.  Mild cardiomegaly.  Apparent right
paratracheal soft tissue fullness is likely due to technique.  Left
costophrenic angle minimally excluded. No pleural effusion or
pneumothorax.  Clear lungs.

Right hemidiaphragm elevation with mild adjacent volume loss.
IMPRESSION: 1. No acute cardiopulmonary disease.
2. Cardiomegaly without congestive failure.

## 2009-12-25 IMAGING — CT CT ABD-PELV W/ CM
2 of 5 series · 16 of 46 positions shown, 18 images · IV contrast (APPLIED)
Comparison: [DATE]

CLINICAL DATA: Abdominal pain.  History of colon cancer.

CT ABDOMEN AND PELVIS WITH CONTRAST
TECHNIQUE: Multidetector CT imaging of the abdomen and pelvis was
performed following the standard protocol during bolus
administration of intravenous contrast.
Contrast: 100 ml of [NS]

[Series 2: abd/pelv with 5.0 b31f st · axial · 0.70mm/px · z∈[-396,+59]mm · 13 of 103 slices shown, 15 images]
[im 6/103  soft-tissue]
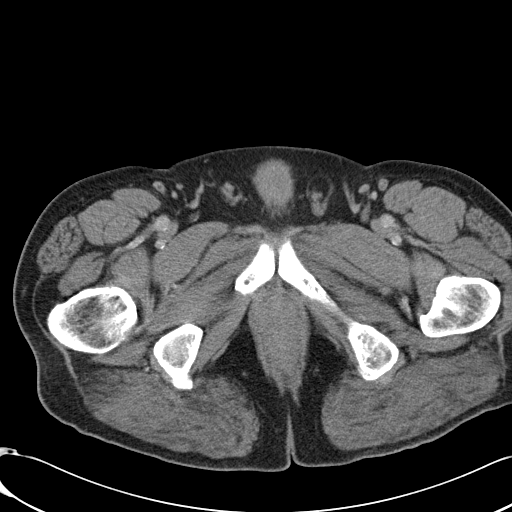
[im 6/103  bone]
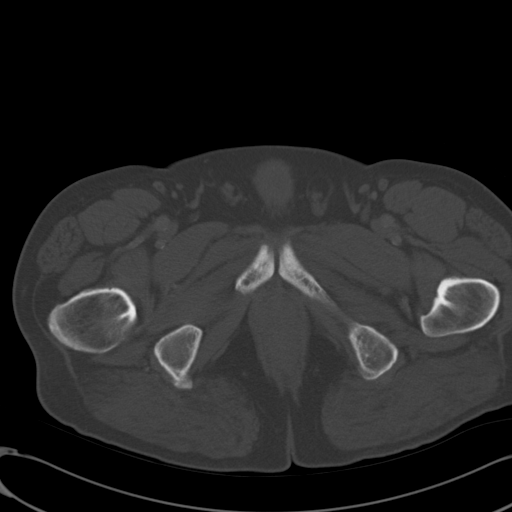
[im 17/103  soft-tissue]
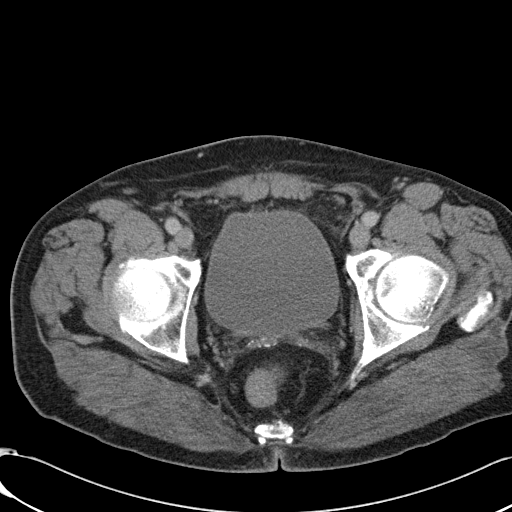
[im 22/103  soft-tissue]
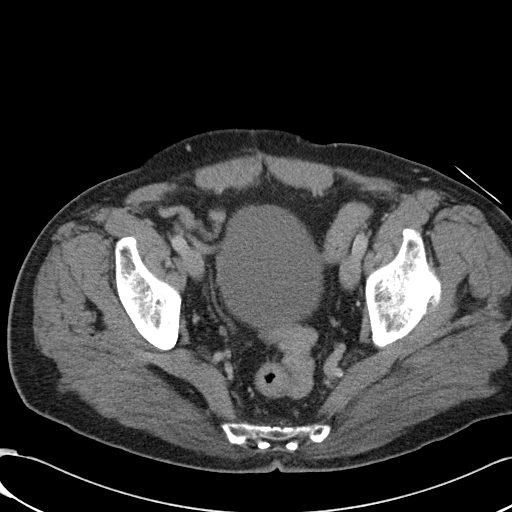
[im 27/103  soft-tissue]
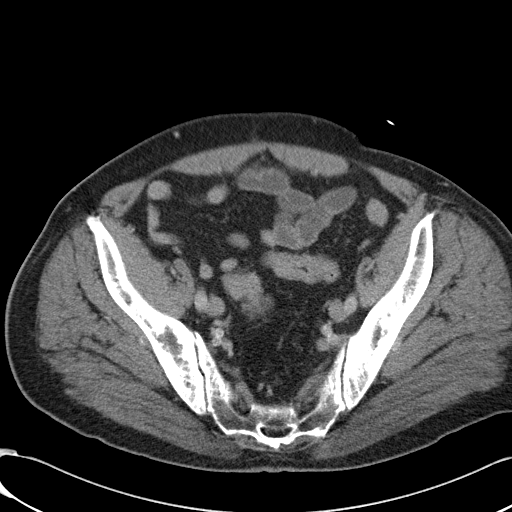
[im 38/103  soft-tissue]
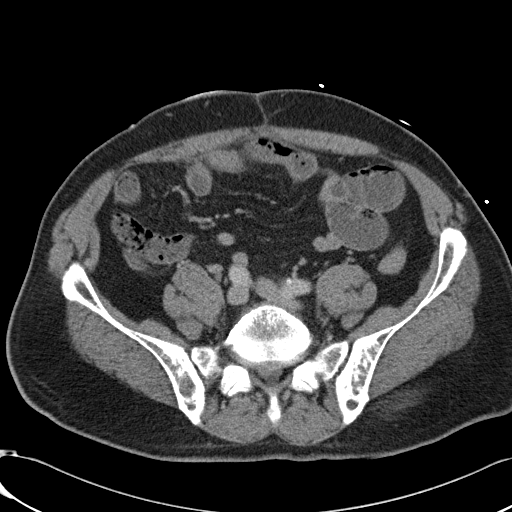
[im 43/103  soft-tissue]
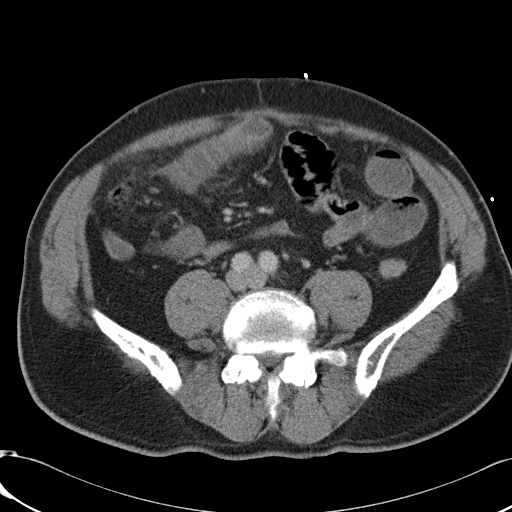
[im 54/103  soft-tissue]
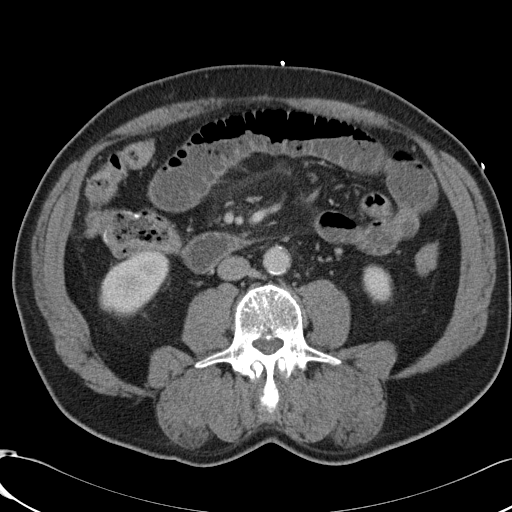
[im 60/103  soft-tissue]
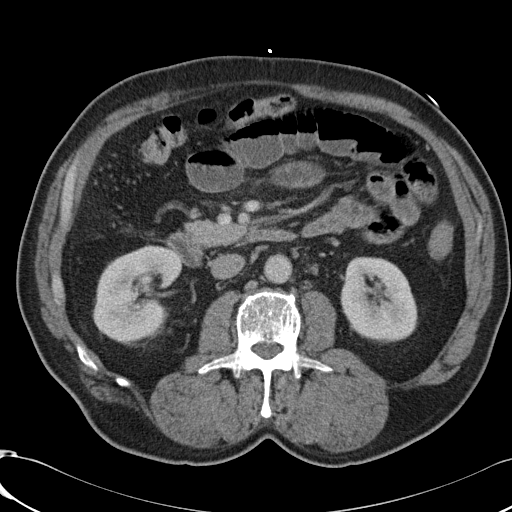
[im 65/103  soft-tissue]
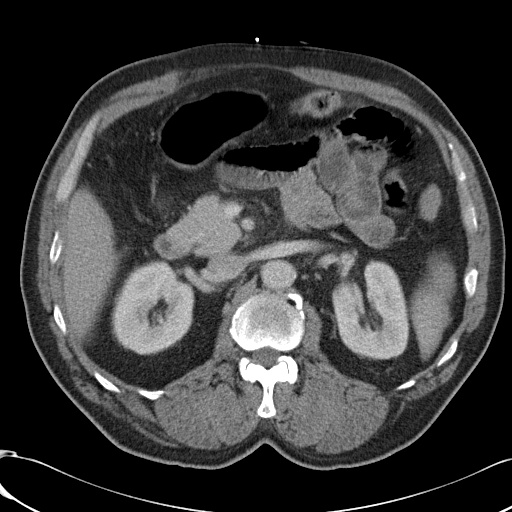
[im 65/103  bone]
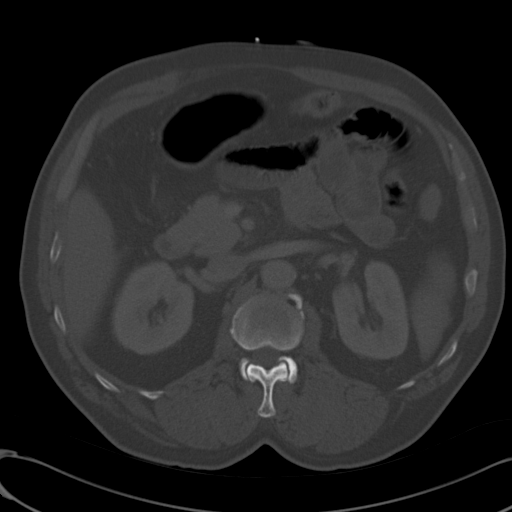
[im 76/103  soft-tissue]
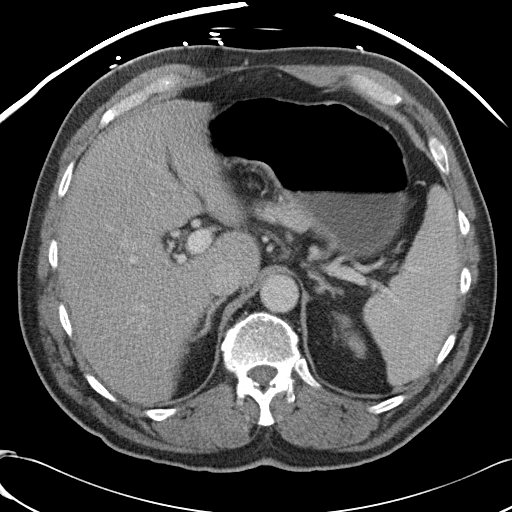
[im 81/103  soft-tissue]
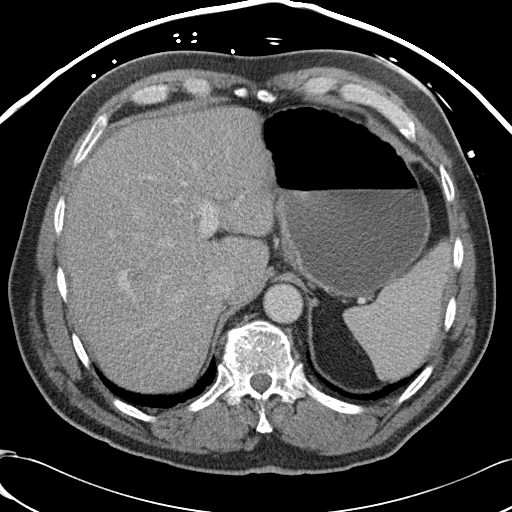
[im 86/103  soft-tissue]
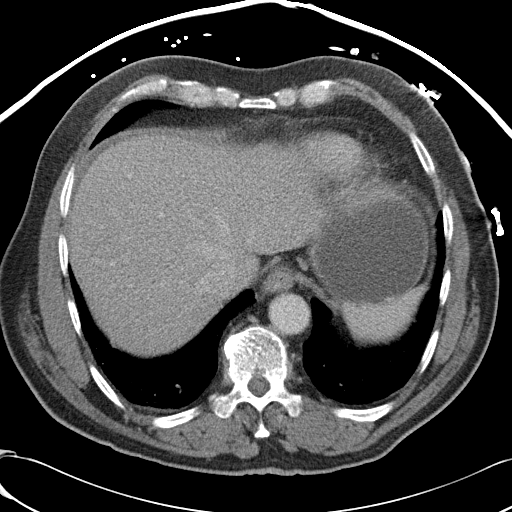
[im 97/103  soft-tissue]
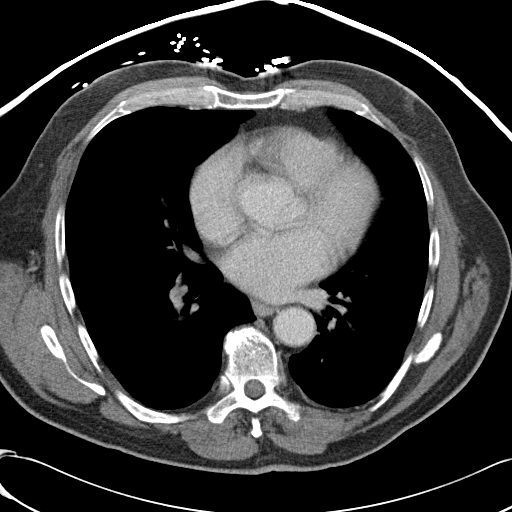

[Series 602: coronal · coronal · 0.99mm/px · 3 of 136 slices shown]
[im 46/136  soft-tissue]
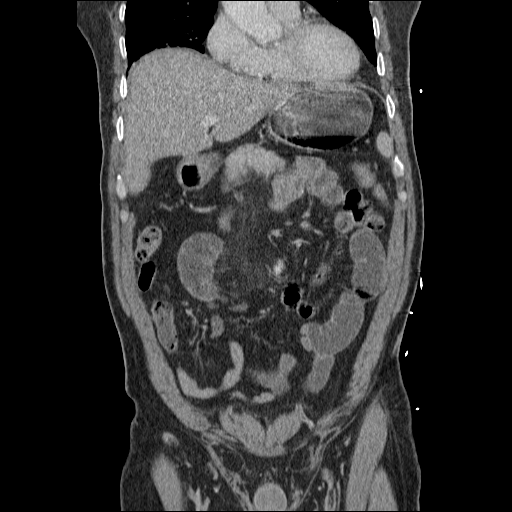
[im 61/136  soft-tissue]
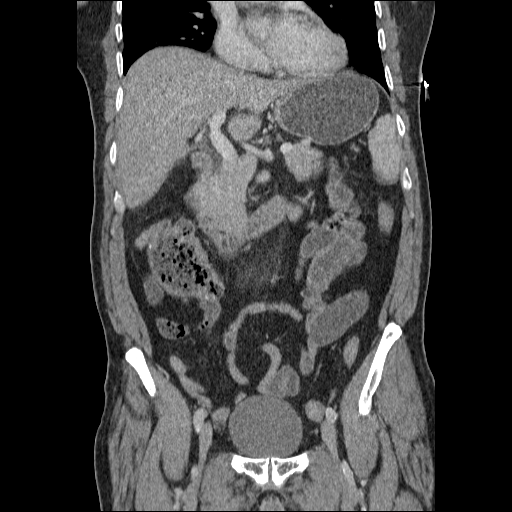
[im 76/136  soft-tissue]
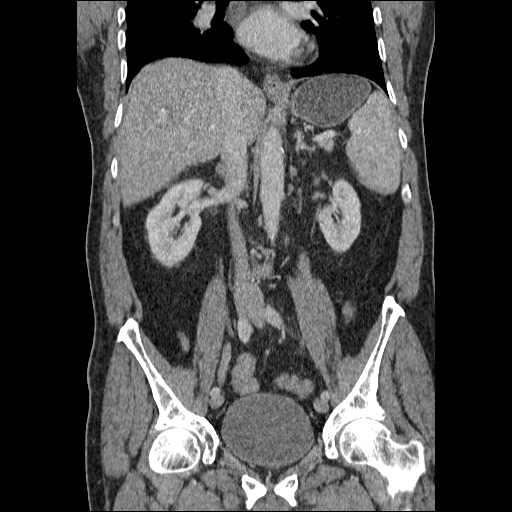

[16 of 46 positions shown; findings below may reference images not displayed]

FINDINGS: 6 mm nodule along the right major fissure as likely is
some pleural lymph node.  There is dependent atelectasis at the
lung bases bilaterally.  No pleural effusions are seen.  Coronary
calcifications are identified.  Unchanged incompletely
characterized low attenuation lesion in the lateral limb of the
right adrenal gland measuring 11 mm.  Its stability suggests a
benign etiology.  The left adrenal gland, spleen and pancreas are
normal.  The patient has had a cholecystectomy and there is no
biliary ductal dilatation.  The kidneys enhance symmetrically.  No
stones, mass or hydronephrosis is seen.  The stomach is normal.
There is abnormal small bowel dilatation in the upper abdomen and
to the mid portion the jejunum measuring up to 3.7 cm.  There is
abrupt transition point in the anterior midportion of the abdomen
is seen on series 2, images 60-64 without associated mass.  These
bowel loops are in close association with the intra-abdominal wall
suggesting adhesive disease.  The remainder of the small bowel is
decompressed.  Postsurgical changes are seen in the right colon
without obstruction at the end of stenotic site.  The remainder of
the colon is normal.  There is unchanged haziness in the mesentery.
No lymphadenopathy or free fluid is seen.  The urinary bladder is
normal.  The prostate is within normal limits.  Bilateral fat
containing inguinal hernias are seen.  The aorta and major branch
vessels are patent.  Degenerative changes are seen in the spine.
There are no aggressive lytic or blastic lesions seen.  Aggressive
osteopenia is again seen in the pelvis unchanged compared to most
recent exam.  Degenerative  changes are seen in the left greater
than right hips.
IMPRESSION: 1.  Small bowel obstruction in the midportion of the jejunum likely
relates to adhesive disease.
2.  Postsurgical changes of right hemicolectomy without obstruction
of the anastomotic site.  Unchanged haziness in the mesentery
likely of no significance.
3.  Probable subpleural lymph node in the major fissure.  However,
this is not seen on the previous examinations.  Given the patient's
history, a follow-up chest CT is recommended in 6 months.
4.  Incidental findings as detailed above.

## 2009-12-26 IMAGING — CR DG ABDOMEN ACUTE W/ 1V CHEST
4 series · 4 of 4 positions shown · non-contrast
Comparison: [DATE] and earlier.

CLINICAL DATA: 64-year-old male partial small bowel obstruction.

ACUTE ABDOMEN SERIES (ABDOMEN 2 VIEW & CHEST 1 VIEW)

[w chest pa]
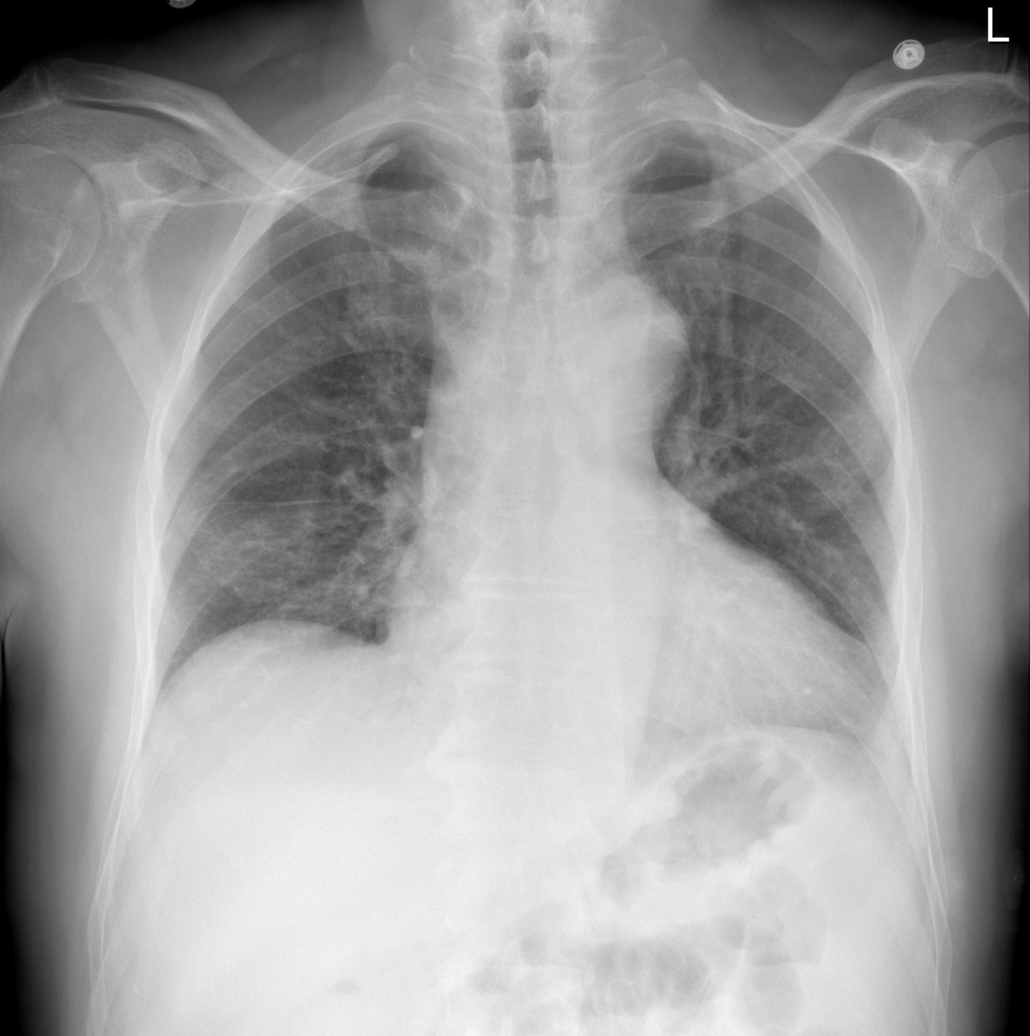

[w abdomen upright]
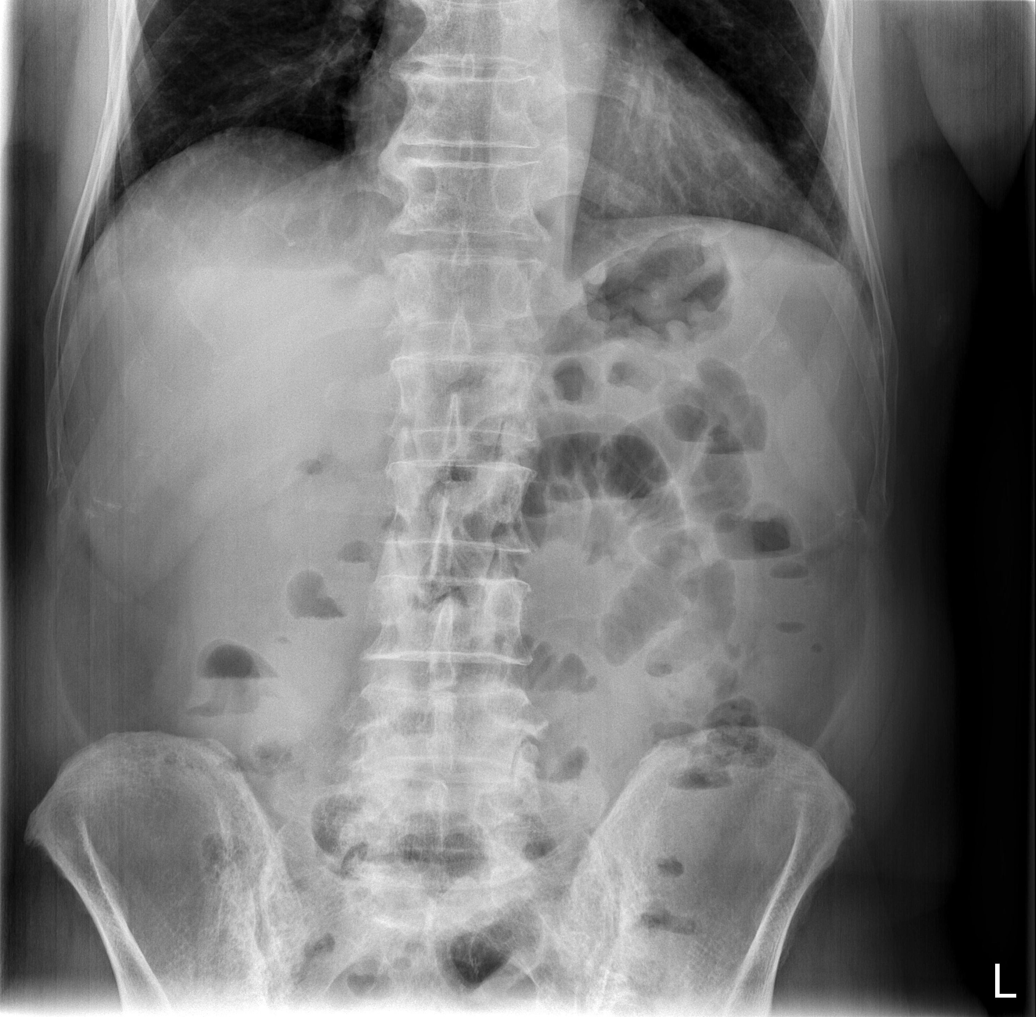

[t abdomen supine (1 of 2)]
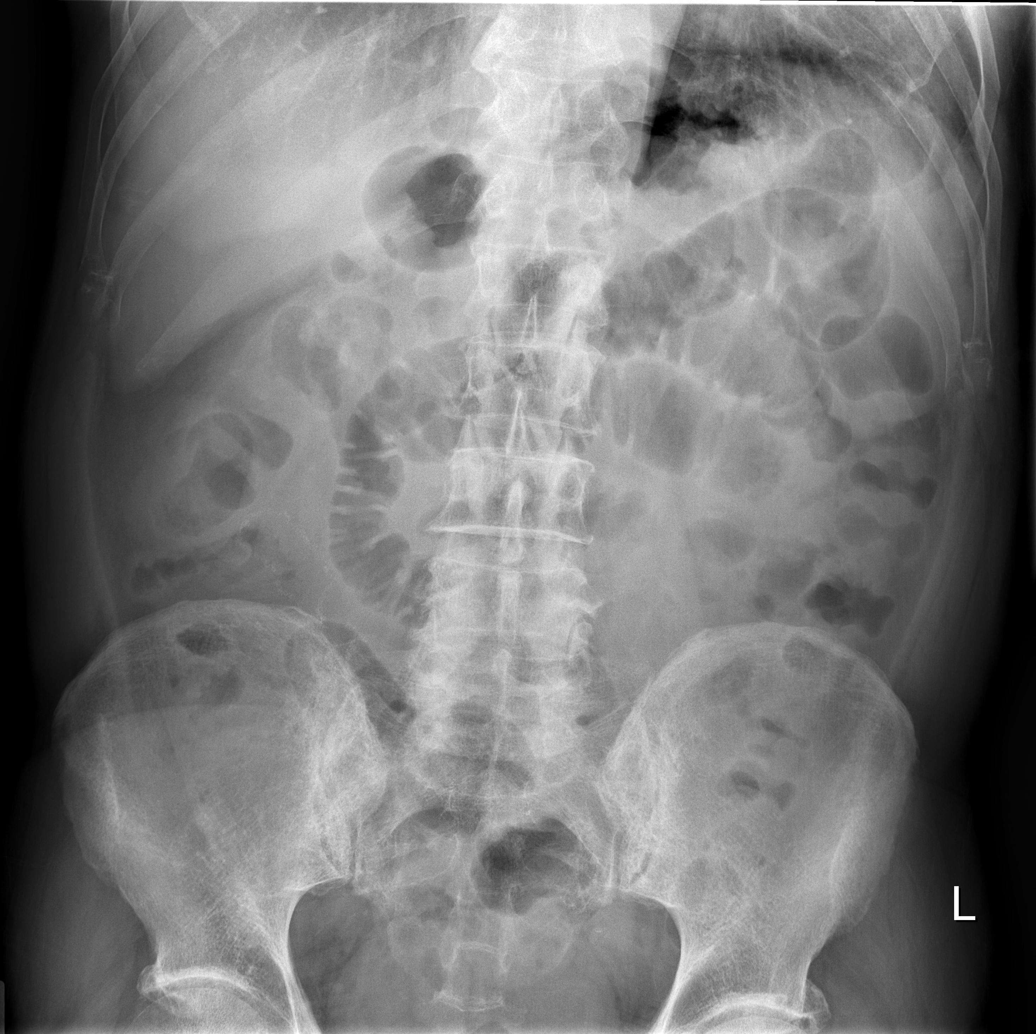

[t abdomen supine (2 of 2)]
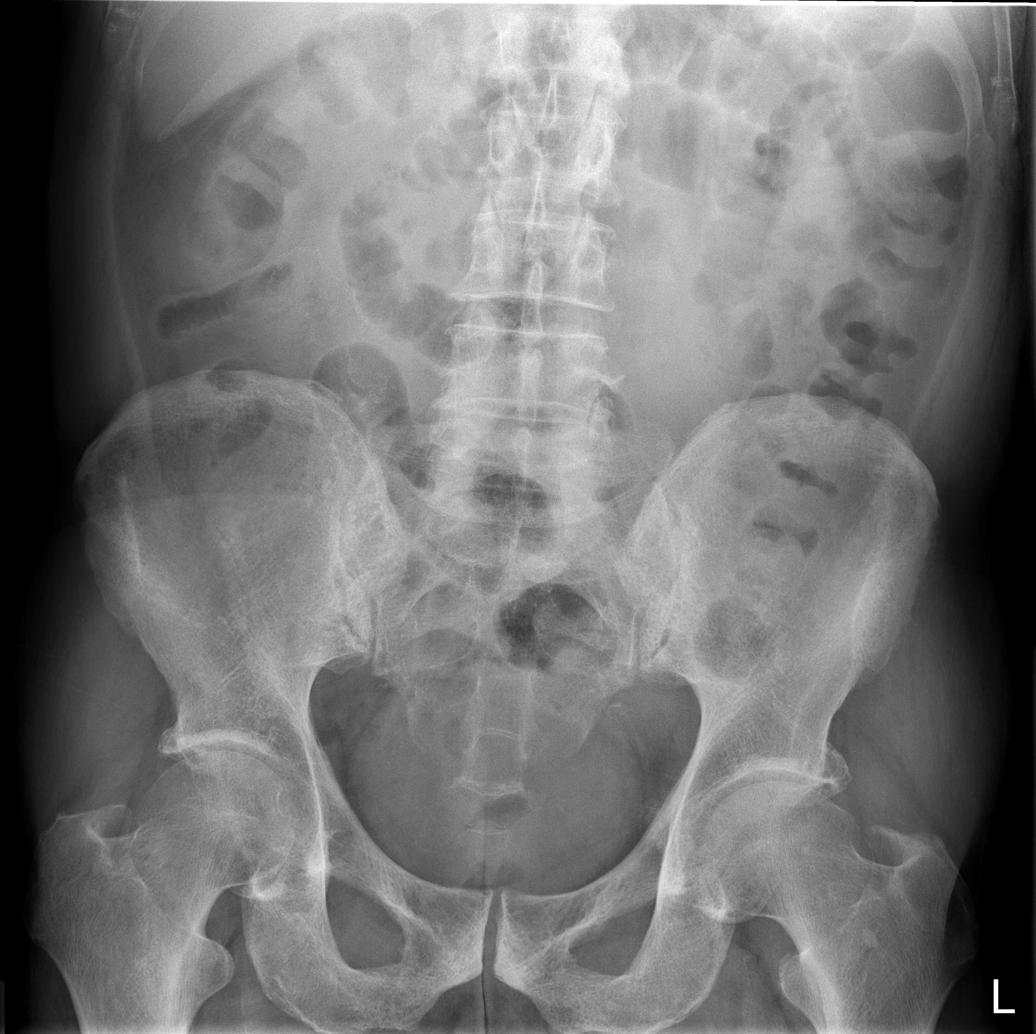

[4 of 4 positions shown; findings below may reference images not displayed]

FINDINGS: Stable cardiomegaly and mediastinal contours.  Continued
somewhat low lung volumes.  Mild patchy opacity at the right base
is stable, favor scarring or atelectasis.  No pneumothorax or
pneumoperitoneum.

Left and central abdominal small bowel loops contain gas and
measure to 37 mm in diameter.  Right lower quadrant small bowel
loops appear decompressed.  Postoperative changes in the right
abdomen seen to represent neo-terminal ileum on comparison.  There
is gas throughout the colon. Stable visualized osseous structures.
IMPRESSION: 1.  Probable unchanged partial small bowel obstruction.  No free
air.
2.  Stable patchy right basilar opacity, favor atelectasis or
scarring.

## 2009-12-28 IMAGING — CR DG ABDOMEN 2V
2 series · 2 of 2 positions shown · non-contrast
Comparison: [DATE]

CLINICAL DATA: Small bowel obstruction.

ABDOMEN - 2 VIEW

[w abdomen upright]
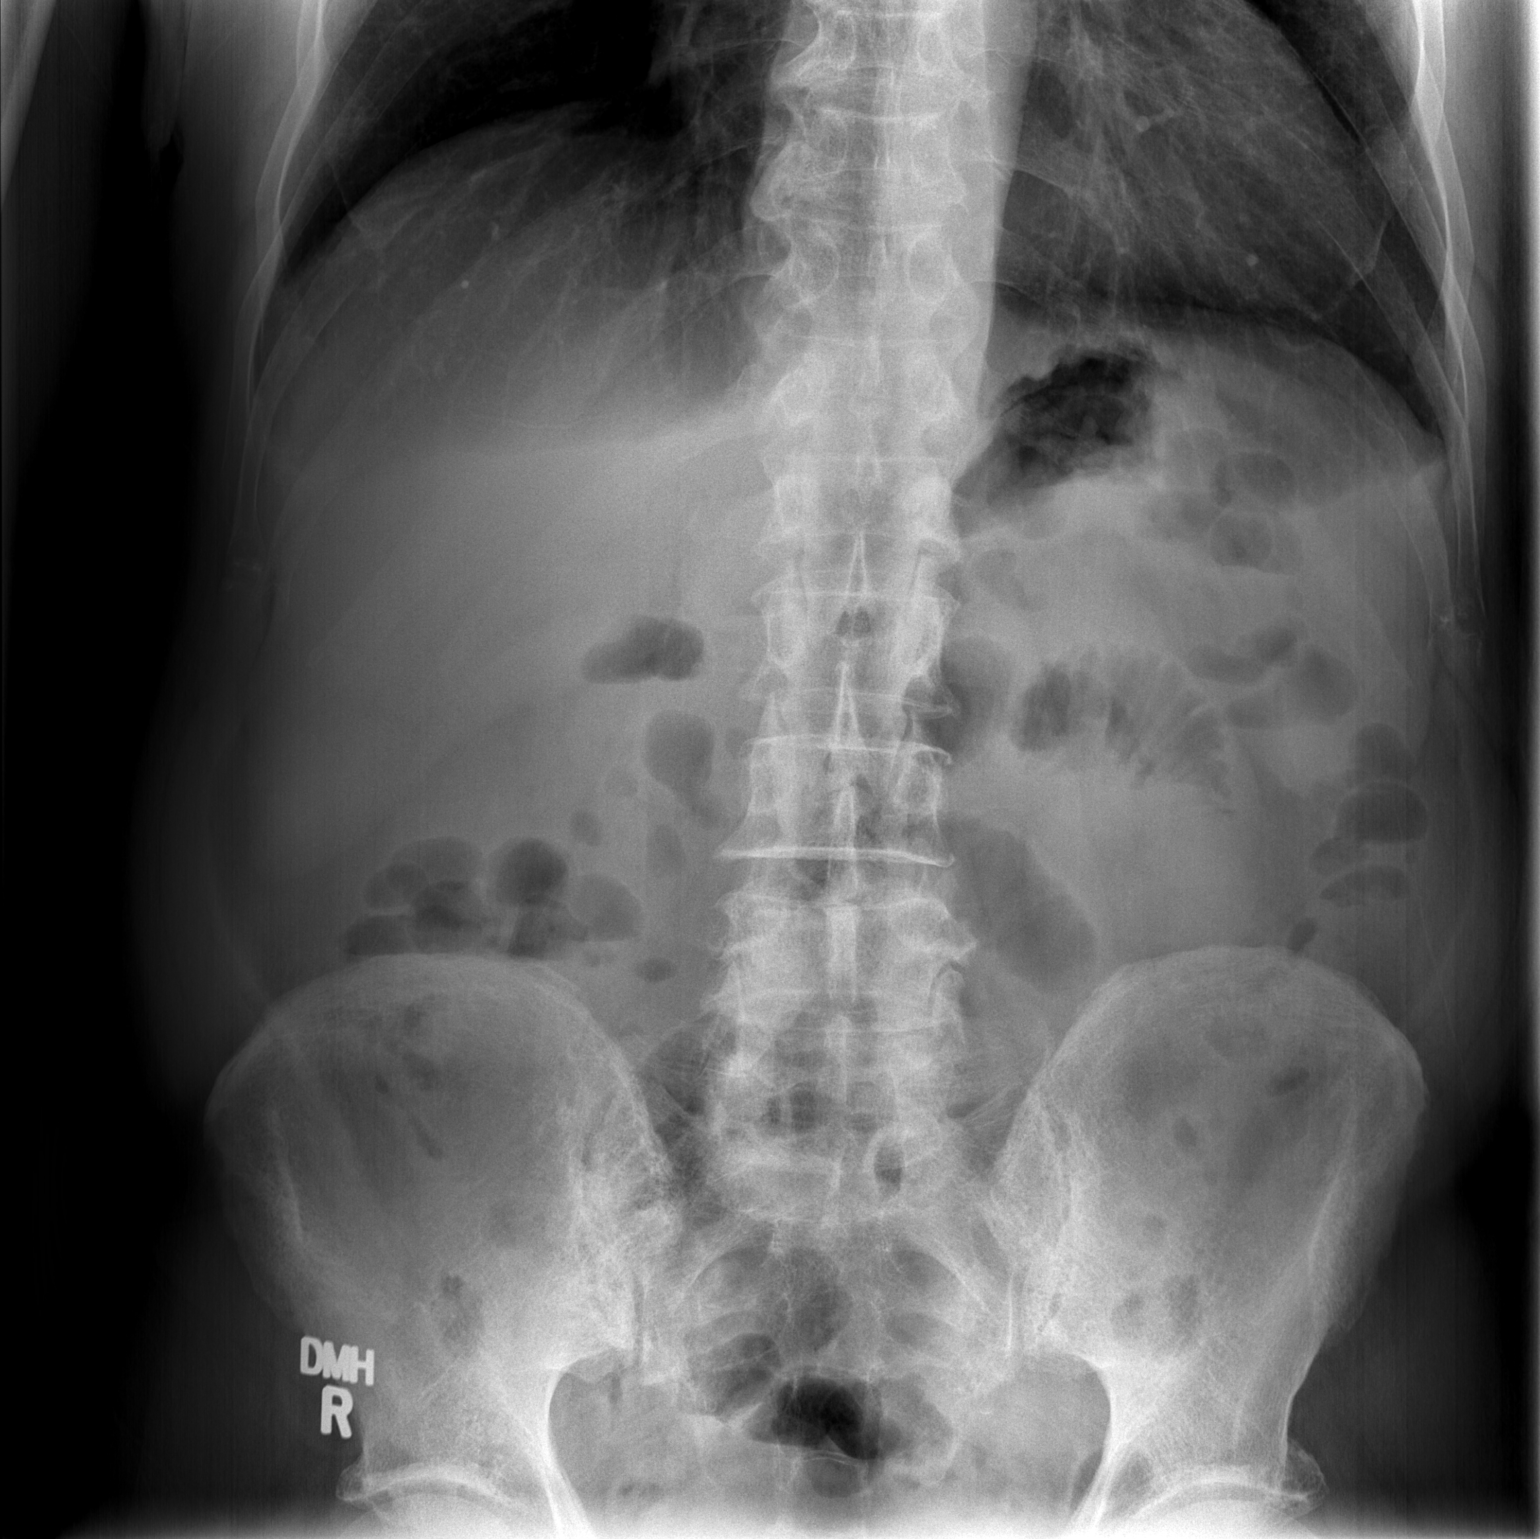

[t abdomen supine]
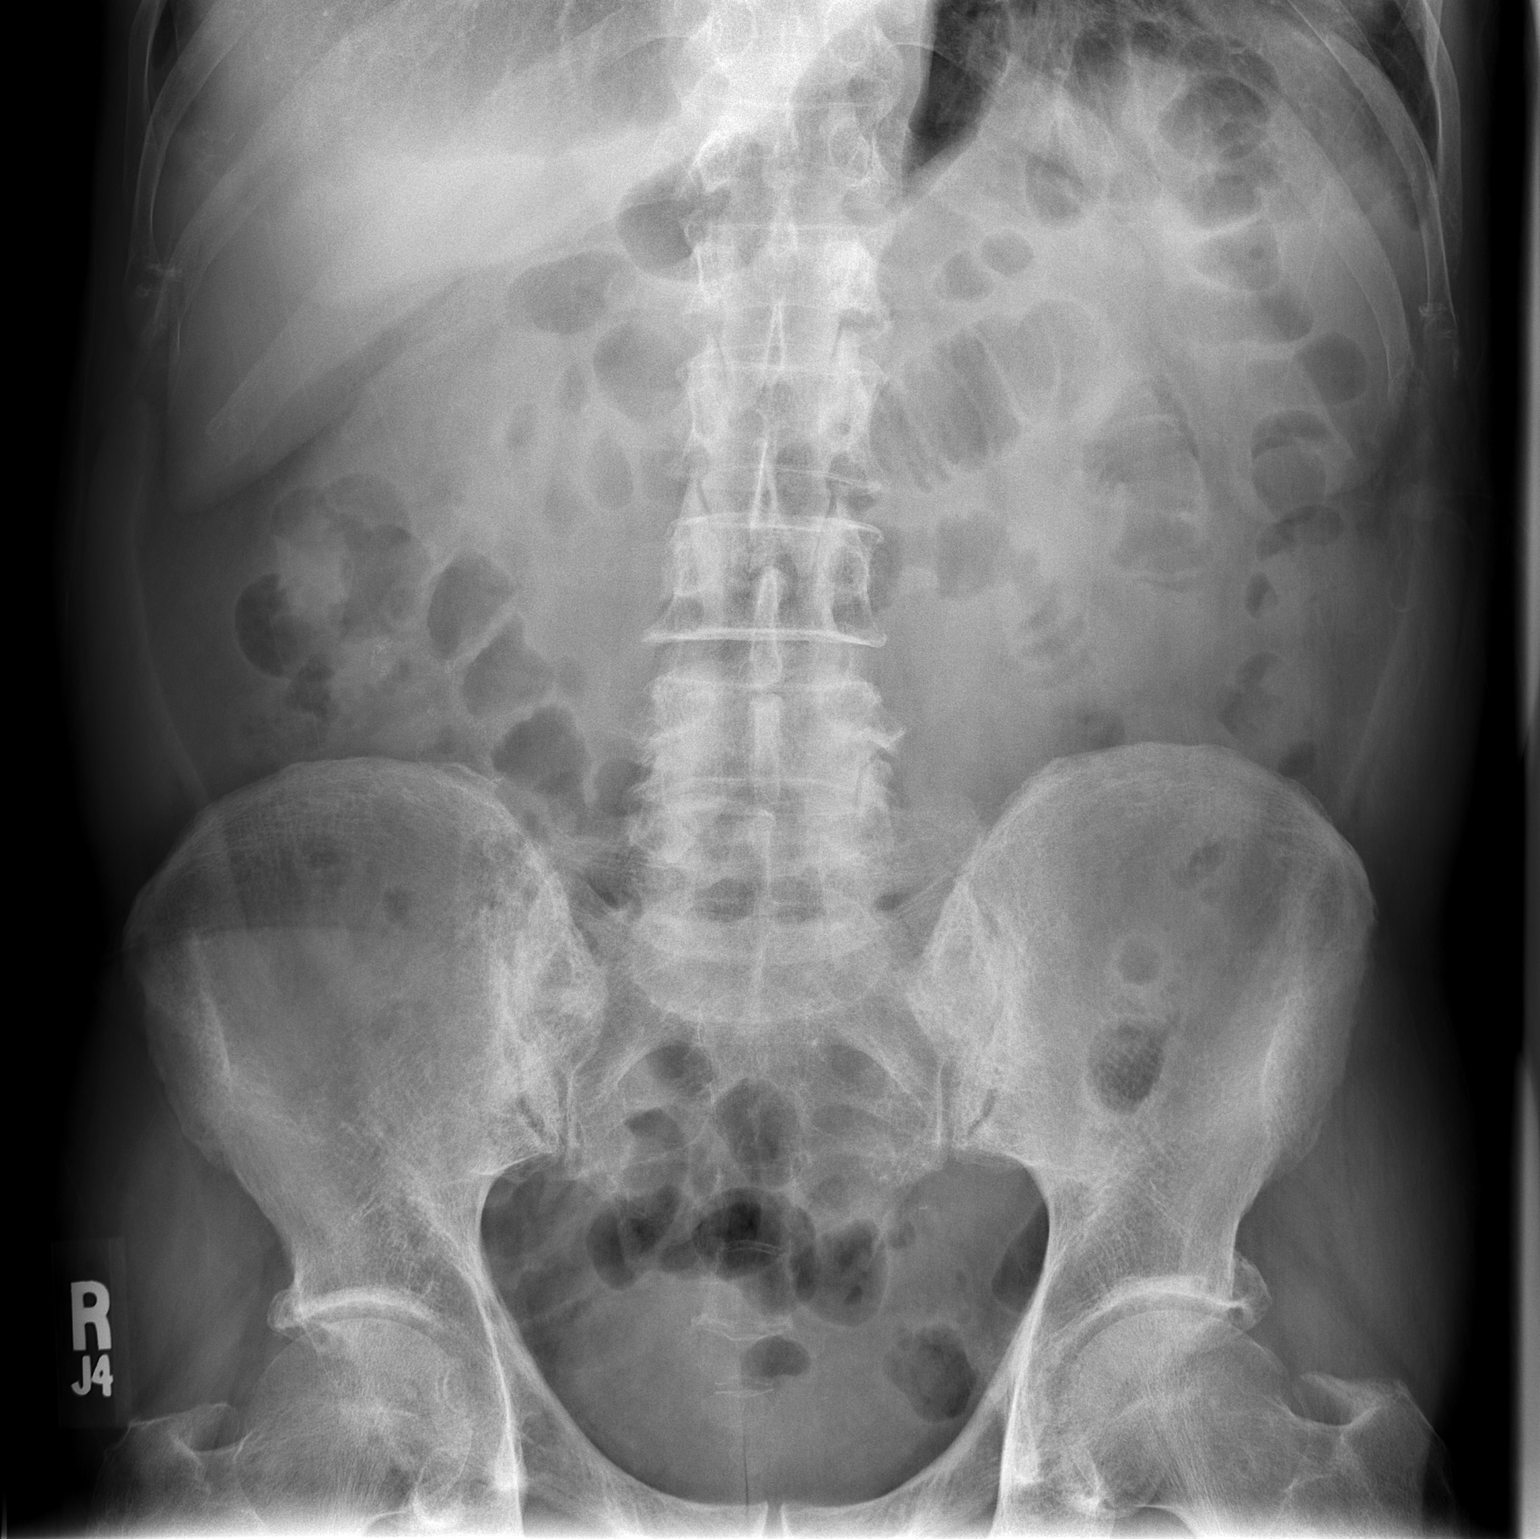

[2 of 2 positions shown; findings below may reference images not displayed]

FINDINGS: There are scattered loops of mildly dilated small bowel,
with air-fluid levels.  Gas is scattered in the colon.  Pattern
appears mildly improved from [DATE].  Degenerative changes in
the spine and hips.
IMPRESSION: Mild interval improvement in partial small bowel obstruction.

## 2010-04-05 LAB — DIFFERENTIAL
Basophils Absolute: 0 K/uL (ref 0.0–0.1)
Basophils Relative: 0 % (ref 0–1)
Eosinophils Absolute: 0 K/uL (ref 0.0–0.7)
Eosinophils Relative: 0 % (ref 0–5)
Lymphocytes Relative: 5 % — ABNORMAL LOW (ref 12–46)
Lymphs Abs: 0.7 K/uL (ref 0.7–4.0)
Monocytes Absolute: 0.5 K/uL (ref 0.1–1.0)
Monocytes Relative: 4 % (ref 3–12)
Neutro Abs: 13.1 K/uL — ABNORMAL HIGH (ref 1.7–7.7)
Neutrophils Relative %: 91 % — ABNORMAL HIGH (ref 43–77)

## 2010-04-05 LAB — URINALYSIS, ROUTINE W REFLEX MICROSCOPIC
Glucose, UA: NEGATIVE mg/dL
Ketones, ur: NEGATIVE mg/dL
Specific Gravity, Urine: 1.044 — ABNORMAL HIGH (ref 1.005–1.030)
pH: 5.5 (ref 5.0–8.0)

## 2010-04-05 LAB — POCT I-STAT, CHEM 8
BUN: 10 mg/dL (ref 6–23)
Chloride: 104 mEq/L (ref 96–112)
Creatinine, Ser: 0.7 mg/dL (ref 0.4–1.5)
Glucose, Bld: 152 mg/dL — ABNORMAL HIGH (ref 70–99)
Potassium: 3.7 mEq/L (ref 3.5–5.1)

## 2010-04-05 LAB — URINE CULTURE
Colony Count: NO GROWTH
Culture  Setup Time: 201112020918

## 2010-04-05 LAB — CBC
HCT: 39.7 % (ref 39.0–52.0)
Hemoglobin: 13.3 g/dL (ref 13.0–17.0)
MCH: 32.5 pg (ref 26.0–34.0)
MCHC: 33.5 g/dL (ref 30.0–36.0)
MCHC: 34.6 g/dL (ref 30.0–36.0)
MCV: 93.8 fL (ref 78.0–100.0)
MCV: 95.2 fL (ref 78.0–100.0)
Platelets: 160 10*3/uL (ref 150–400)
RDW: 12.3 % (ref 11.5–15.5)
RDW: 12.6 % (ref 11.5–15.5)
WBC: 14.3 10*3/uL — ABNORMAL HIGH (ref 4.0–10.5)

## 2010-04-05 LAB — COMPREHENSIVE METABOLIC PANEL
Albumin: 3.8 g/dL (ref 3.5–5.2)
BUN: 9 mg/dL (ref 6–23)
Calcium: 8.5 mg/dL (ref 8.4–10.5)
Creatinine, Ser: 0.86 mg/dL (ref 0.4–1.5)
Glucose, Bld: 158 mg/dL — ABNORMAL HIGH (ref 70–99)
Potassium: 3.7 mEq/L (ref 3.5–5.1)
Total Protein: 6.1 g/dL (ref 6.0–8.3)

## 2010-04-05 LAB — LIPASE, BLOOD: Lipase: 34 U/L (ref 11–59)

## 2010-04-05 LAB — BASIC METABOLIC PANEL
BUN: 7 mg/dL (ref 6–23)
CO2: 29 mEq/L (ref 19–32)
Chloride: 106 mEq/L (ref 96–112)
Glucose, Bld: 104 mg/dL — ABNORMAL HIGH (ref 70–99)
Potassium: 3.3 mEq/L — ABNORMAL LOW (ref 3.5–5.1)
Sodium: 141 mEq/L (ref 135–145)

## 2010-04-05 LAB — LACTIC ACID, PLASMA: Lactic Acid, Venous: 3.6 mmol/L — ABNORMAL HIGH (ref 0.5–2.2)

## 2010-04-12 ENCOUNTER — Encounter (HOSPITAL_BASED_OUTPATIENT_CLINIC_OR_DEPARTMENT_OTHER): Payer: BC Managed Care – PPO | Admitting: Oncology

## 2010-04-12 ENCOUNTER — Other Ambulatory Visit (HOSPITAL_COMMUNITY): Payer: Self-pay | Admitting: Oncology

## 2010-04-12 DIAGNOSIS — Z85038 Personal history of other malignant neoplasm of large intestine: Secondary | ICD-10-CM

## 2010-04-12 LAB — CBC WITH DIFFERENTIAL/PLATELET
Basophils Absolute: 0 10*3/uL (ref 0.0–0.1)
EOS%: 0.3 % (ref 0.0–7.0)
Eosinophils Absolute: 0 10*3/uL (ref 0.0–0.5)
LYMPH%: 20.5 % (ref 14.0–49.0)
MCH: 32.8 pg (ref 27.2–33.4)
MCV: 93.3 fL (ref 79.3–98.0)
MONO%: 9.8 % (ref 0.0–14.0)
Platelets: 166 10*3/uL (ref 140–400)
RBC: 4.39 10*6/uL (ref 4.20–5.82)
RDW: 12.7 % (ref 11.0–14.6)

## 2010-04-12 LAB — COMPREHENSIVE METABOLIC PANEL
AST: 17 U/L (ref 0–37)
Albumin: 4.4 g/dL (ref 3.5–5.2)
Alkaline Phosphatase: 56 U/L (ref 39–117)
BUN: 10 mg/dL (ref 6–23)
Glucose, Bld: 123 mg/dL — ABNORMAL HIGH (ref 70–99)
Potassium: 3.7 mEq/L (ref 3.5–5.3)
Sodium: 140 mEq/L (ref 135–145)
Total Bilirubin: 0.7 mg/dL (ref 0.3–1.2)

## 2010-04-12 LAB — CEA: CEA: 1.1 ng/mL (ref 0.0–5.0)

## 2010-06-11 NOTE — Op Note (Signed)
NAMEORSON, RHO             ACCOUNT NO.:  [id number]   MEDICAL RECORD NO.:  [id number]          PATIENT TYPE:  AMB   LOCATION:  ENDO                         FACILITY:  [facility]   PHYSICIAN:  [name], M.D.   DATE OF BIRTH:  [birth year]   DATE OF PROCEDURE:  12/30/2003  DATE OF DISCHARGE:                                 OPERATIVE REPORT   PROCEDURE:  Colonoscopy with biopsy.   INDICATIONS FOR PROCEDURE:  A 65 year old [place] immigrant, one-year status  post right hemicolectomy for colon cancer.   FINDINGS:  3 mm polyp removed from the sigmoid colon by cold biopsy.   DESCRIPTION OF PROCEDURE:  The nature, purpose, and risks of the procedure  were familiar to the patient from prior examination and he provided written  consent.  Sedation was fentanyl 75 mcg and Versed 10 mg IV without  arrhythmias or desaturation.  The Olympus adjustable tension pediatric video  colonoscope was advanced with some looping.  (The adult scope might be  preferable for subsequent procedures.)  It was advanced around the colon to  the ileocolonic anastomosis as identified by visualization of small bowel  mucosa and the presence of sutures.  Pullback was then performed.  The  quality of the prep was excellent and it was felt that all areas were well  seen.   There was a 3 mm polyp at 30 cm removed by several cold biopsies.  No other  polyps were seen and there was no evidence of intraluminal recurrence of his  colon cancer, nor any other colon cancer, masses, colitis, large polyps, or  diverticulosis.  Retroflexion in the rectum was unremarkable.   The patient tolerated the procedure well and there were no apparent  complications.   IMPRESSION:  1.  Diminutive sigmoid polyp removed as described above (211.3).  2.  History of colon cancer, status post right hemicolectomy.   PLAN:  Await pathology results.  Probable colonoscopic follow-up in two  years.       RB/MEDQ  D:  12/30/2003  T:   12/30/2003  Job:  [id number]   cc:   Samul Dada, M.D.  [address] [facility]  [place]  [place] [address]  Fax: [phone]   Mercie Eon, M.D.   [name]. [name], M.D.  [address]  [place]  [place] [address]  Fax: [phone]

## 2010-06-11 NOTE — Discharge Summary (Signed)
NAMEMARCELLES, CLINARD                         ACCOUNT NO.:  [id number]   MEDICAL RECORD NO.:  [id number]                   PATIENT TYPE:  INP   LOCATION:  5710                                 FACILITY:  [facility]   PHYSICIAN:  [name]. [name], M.D.              DATE OF BIRTH:  1945-11-05   DATE OF ADMISSION:  01/15/2003  DATE OF DISCHARGE:  01/21/2003                                 DISCHARGE SUMMARY   HISTORY OF PRESENT ILLNESS:  Mr. Rodarte is a 65 year old gentleman who was  brought to the operating room on January 15, 2003.  He had a near  obstructing right colon cancer.  He underwent a right hemicolectomy which he  tolerated well.  Postoperatively, he had no real complications.  It took a  couple of days before his bowel function began to return, but at that point  he was able to get his NG tube out and advanced his diet slowly.  On  January 21, 2003, he was tolerating a diet, his pain was under good  control, and his bowels were working, and he was ready for discharge home.   DISCHARGE MEDICATIONS:  1. He is to resume his home medications.  2. He was given a prescription for Vicodin for pain.   ACTIVITY:  No heavy lifting.   DIET:  As tolerated.   FINAL DIAGNOSIS:  Right colon cancer.   FOLLOWUP:  Dr. Carolynne Edouard in the next two weeks.   DISPOSITION:  Discharged home.                                                [name]. [name], M.D.    PST/MEDQ  D:  02/10/2003  T:  02/10/2003  Job:  [id number]

## 2010-10-14 ENCOUNTER — Encounter (HOSPITAL_BASED_OUTPATIENT_CLINIC_OR_DEPARTMENT_OTHER): Payer: BC Managed Care – PPO | Admitting: Oncology

## 2010-10-14 ENCOUNTER — Ambulatory Visit (HOSPITAL_COMMUNITY)
Admission: RE | Admit: 2010-10-14 | Discharge: 2010-10-14 | Disposition: A | Discharge status: Home / Self Care | Payer: BC Managed Care – PPO | Source: Ambulatory Visit | Attending: Oncology | Admitting: Oncology

## 2010-10-14 ENCOUNTER — Other Ambulatory Visit (HOSPITAL_COMMUNITY): Payer: Self-pay | Admitting: Oncology

## 2010-10-14 DIAGNOSIS — R9389 Abnormal findings on diagnostic imaging of other specified body structures: Secondary | ICD-10-CM

## 2010-10-14 DIAGNOSIS — C189 Malignant neoplasm of colon, unspecified: Secondary | ICD-10-CM

## 2010-10-14 DIAGNOSIS — Z85038 Personal history of other malignant neoplasm of large intestine: Secondary | ICD-10-CM

## 2010-10-14 LAB — CBC WITH DIFFERENTIAL/PLATELET
Basophils Absolute: 0 10*3/uL (ref 0.0–0.1)
EOS%: 0.3 % (ref 0.0–7.0)
Eosinophils Absolute: 0 10*3/uL (ref 0.0–0.5)
HCT: 42.9 % (ref 38.4–49.9)
HGB: 15.1 g/dL (ref 13.0–17.1)
MCH: 33.4 pg (ref 27.2–33.4)
MCV: 94.6 fL (ref 79.3–98.0)
NEUT#: 5 10*3/uL (ref 1.5–6.5)
NEUT%: 71.4 % (ref 39.0–75.0)
lymph#: 1.3 10*3/uL (ref 0.9–3.3)

## 2010-10-14 LAB — COMPREHENSIVE METABOLIC PANEL
CO2: 25 mEq/L (ref 19–32)
Calcium: 9.2 mg/dL (ref 8.4–10.5)
Chloride: 106 mEq/L (ref 96–112)
Creatinine, Ser: 0.79 mg/dL (ref 0.50–1.35)
Glucose, Bld: 86 mg/dL (ref 70–99)
Total Bilirubin: 0.6 mg/dL (ref 0.3–1.2)

## 2010-10-14 LAB — CEA: CEA: 0.8 ng/mL (ref 0.0–5.0)

## 2010-10-14 IMAGING — CR DG CHEST 2V
2 series · 2 of 2 positions shown · non-contrast
Comparison: Radiographs ranging from [DATE] through
[DATE].

CLINICAL DATA: History of colon cancer.

CHEST - 2 VIEW

[w chest pa]
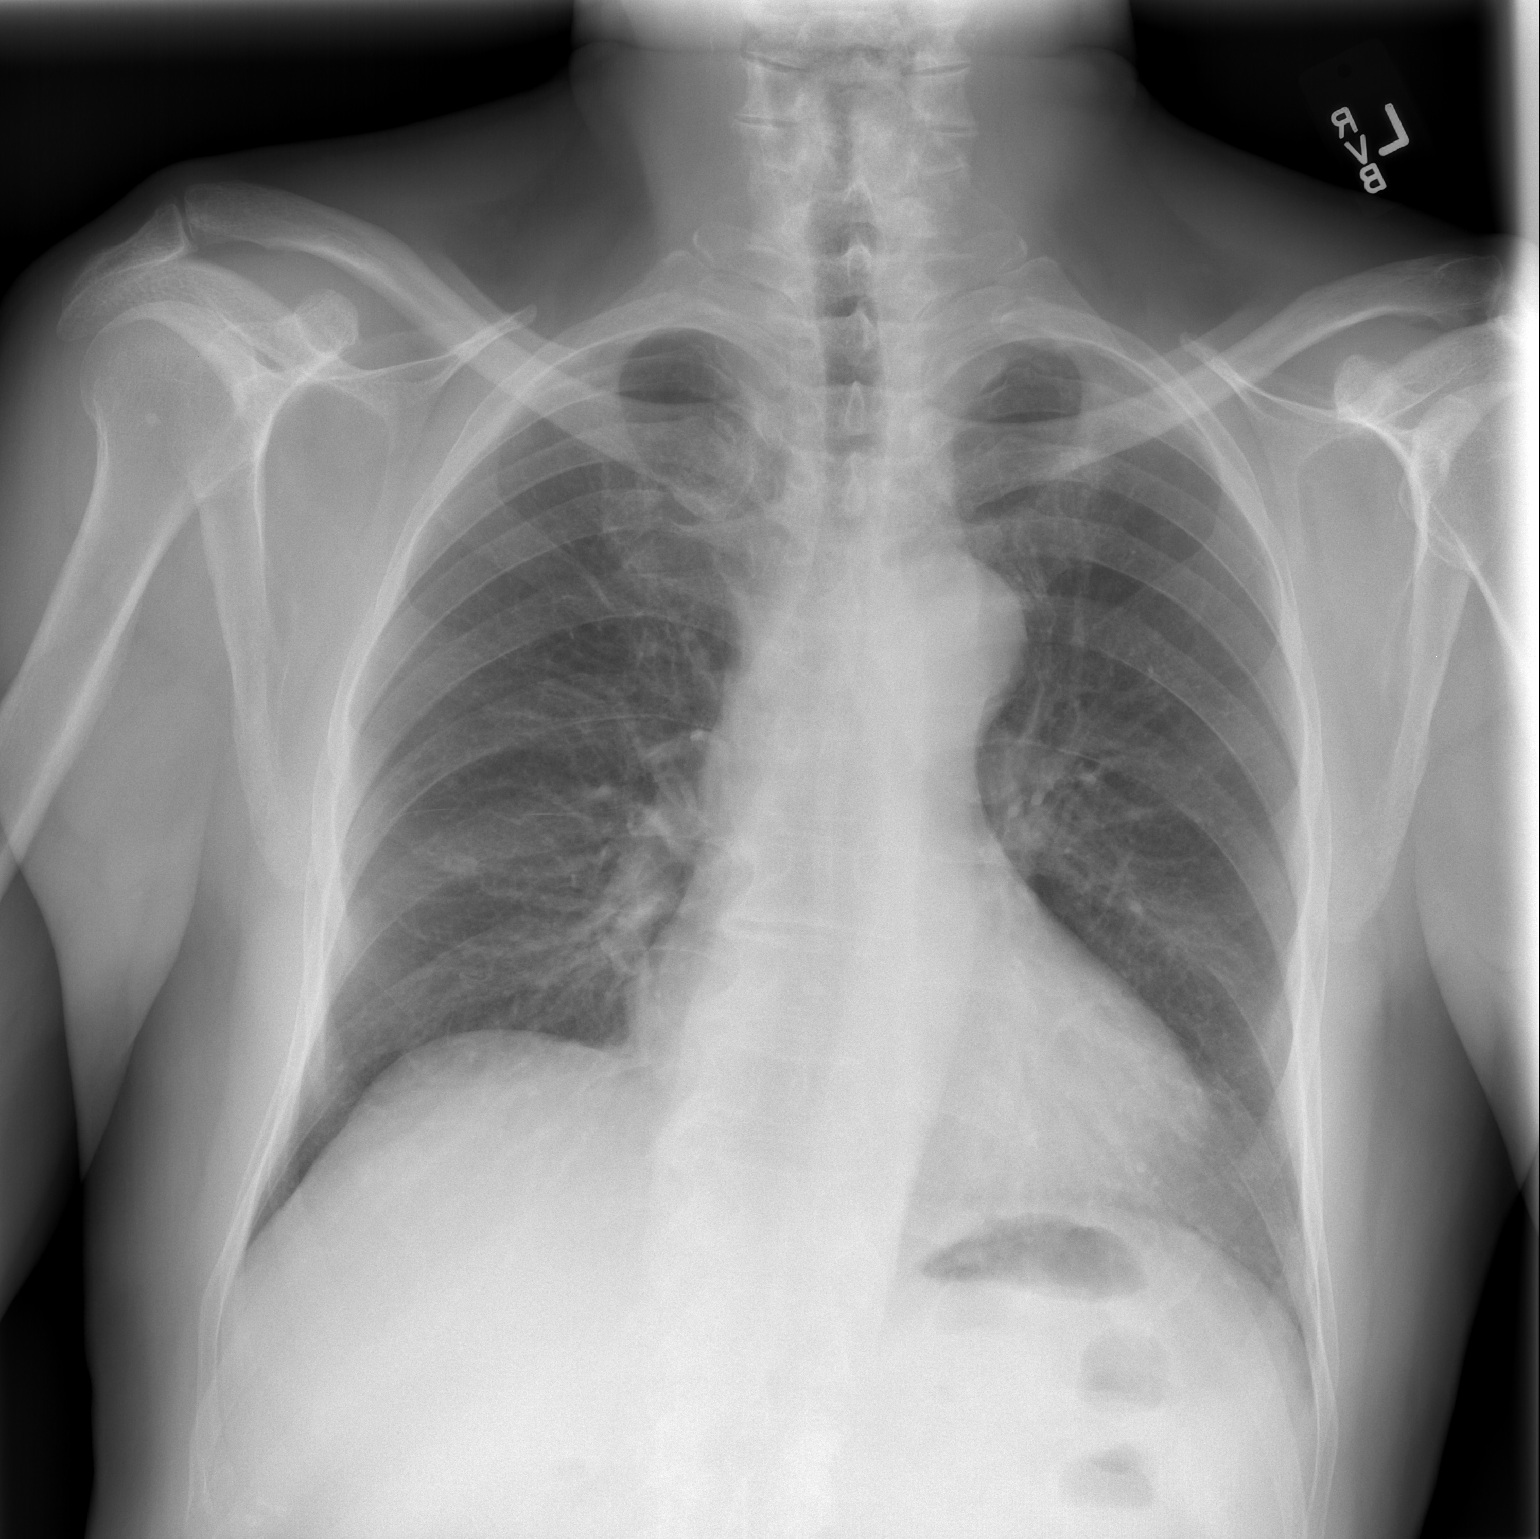

[w chest lat]
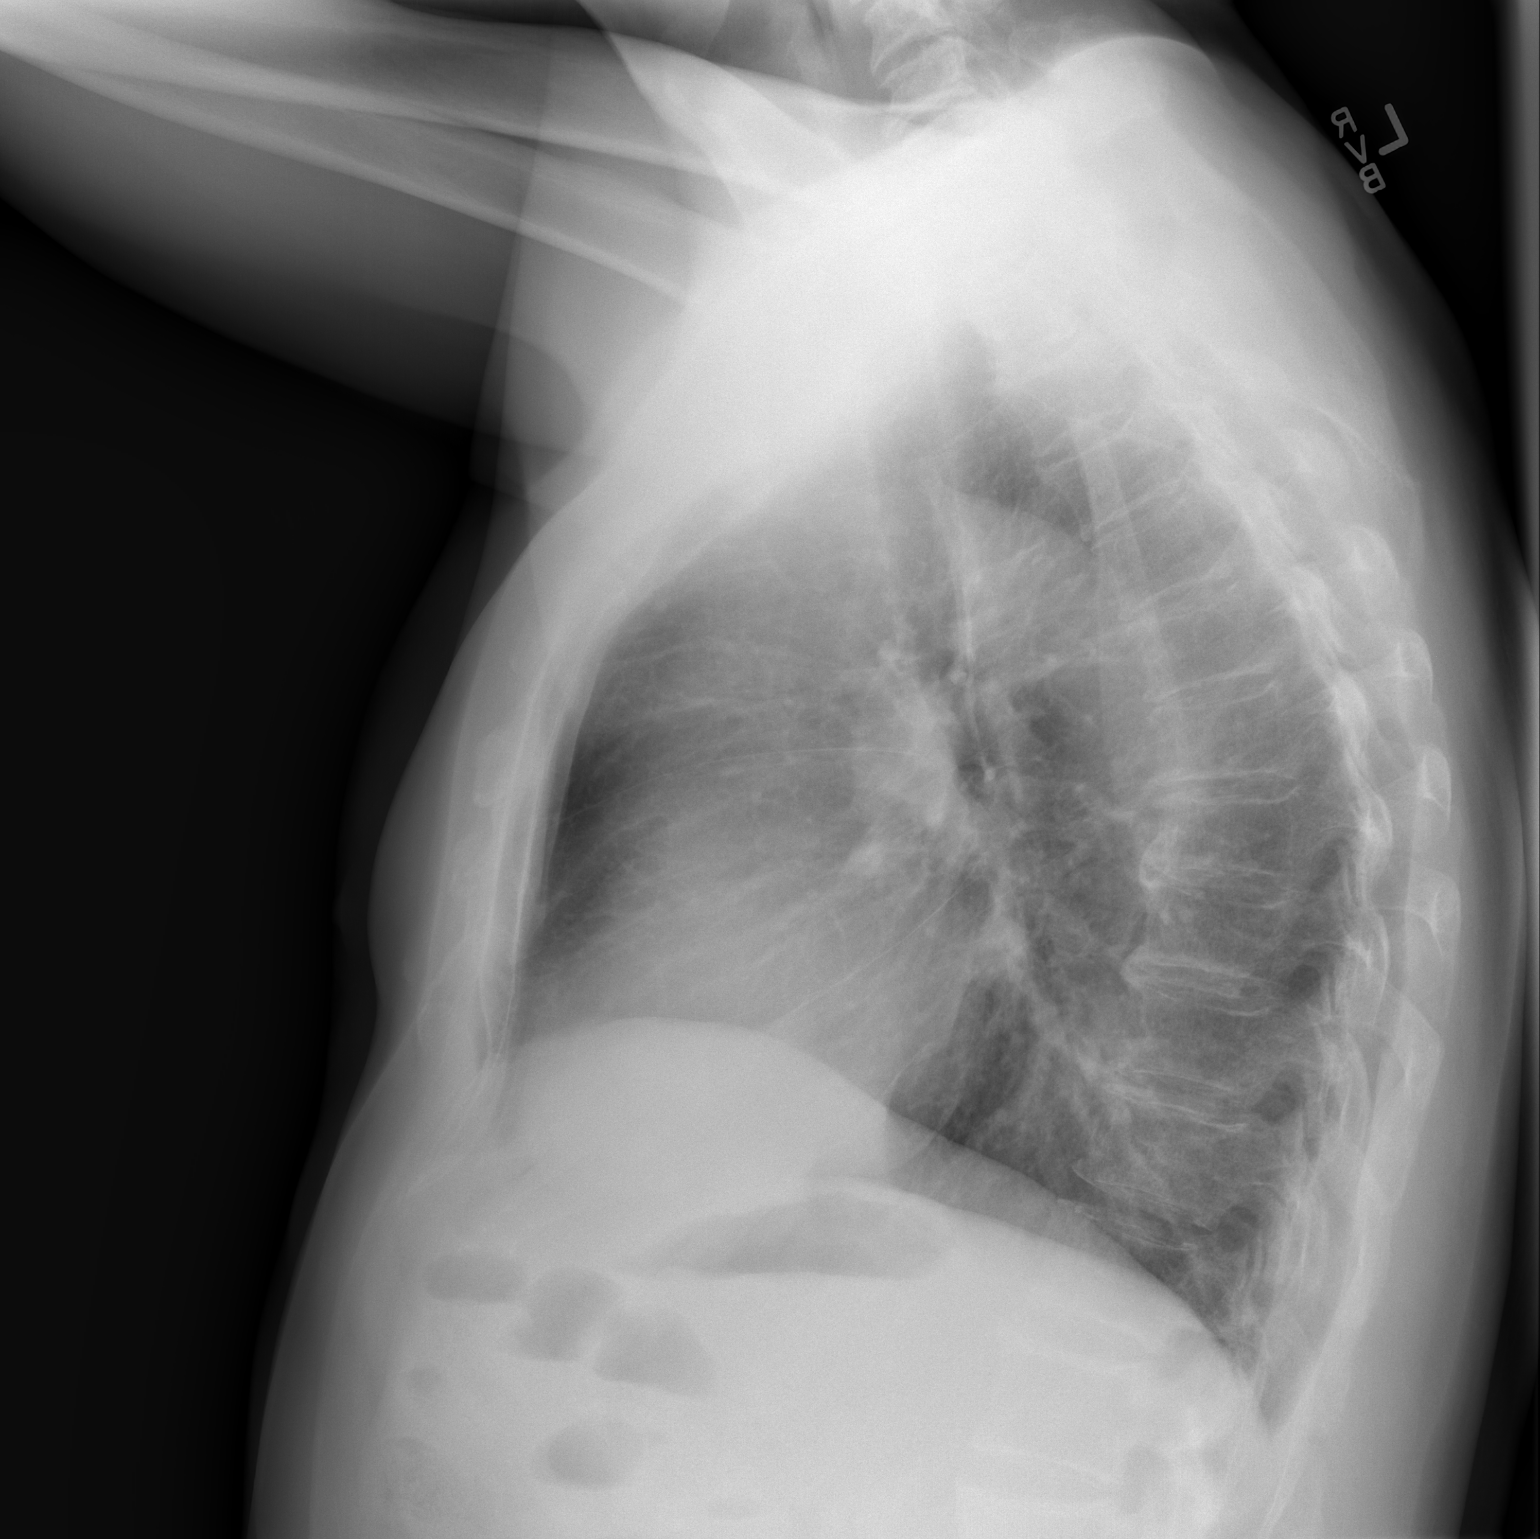

[2 of 2 positions shown; findings below may reference images not displayed]

FINDINGS: The heart size and mediastinal contours are stable.
There is a small nodular density superimposed over the anterior
aspect of the right fourth rib on the frontal examination.  This is
not clearly seen on prior studies or on today's lateral view.
Although possibly a nipple shadow, a small pulmonary nodule cannot
be excluded.  The lungs otherwise clear.  There is no pleural
effusion.
IMPRESSION: Possible right pulmonary nodule versus nipple shadow.  Repeat PA
view with nipple markers recommended.  Otherwise stable
examination.

## 2010-10-14 IMAGING — CR DG CHEST SPECIAL VIEW
1 series · 1 of 1 positions shown · non-contrast
Comparison: Plain films of the chest [DATE], [DATE] and
[DATE].

CLINICAL DATA: History of colon cancer.  Possible pulmonary nodule
on prior study.  Nipple markers are in place.

CHEST SPECIAL VIEW

[w chest apical]
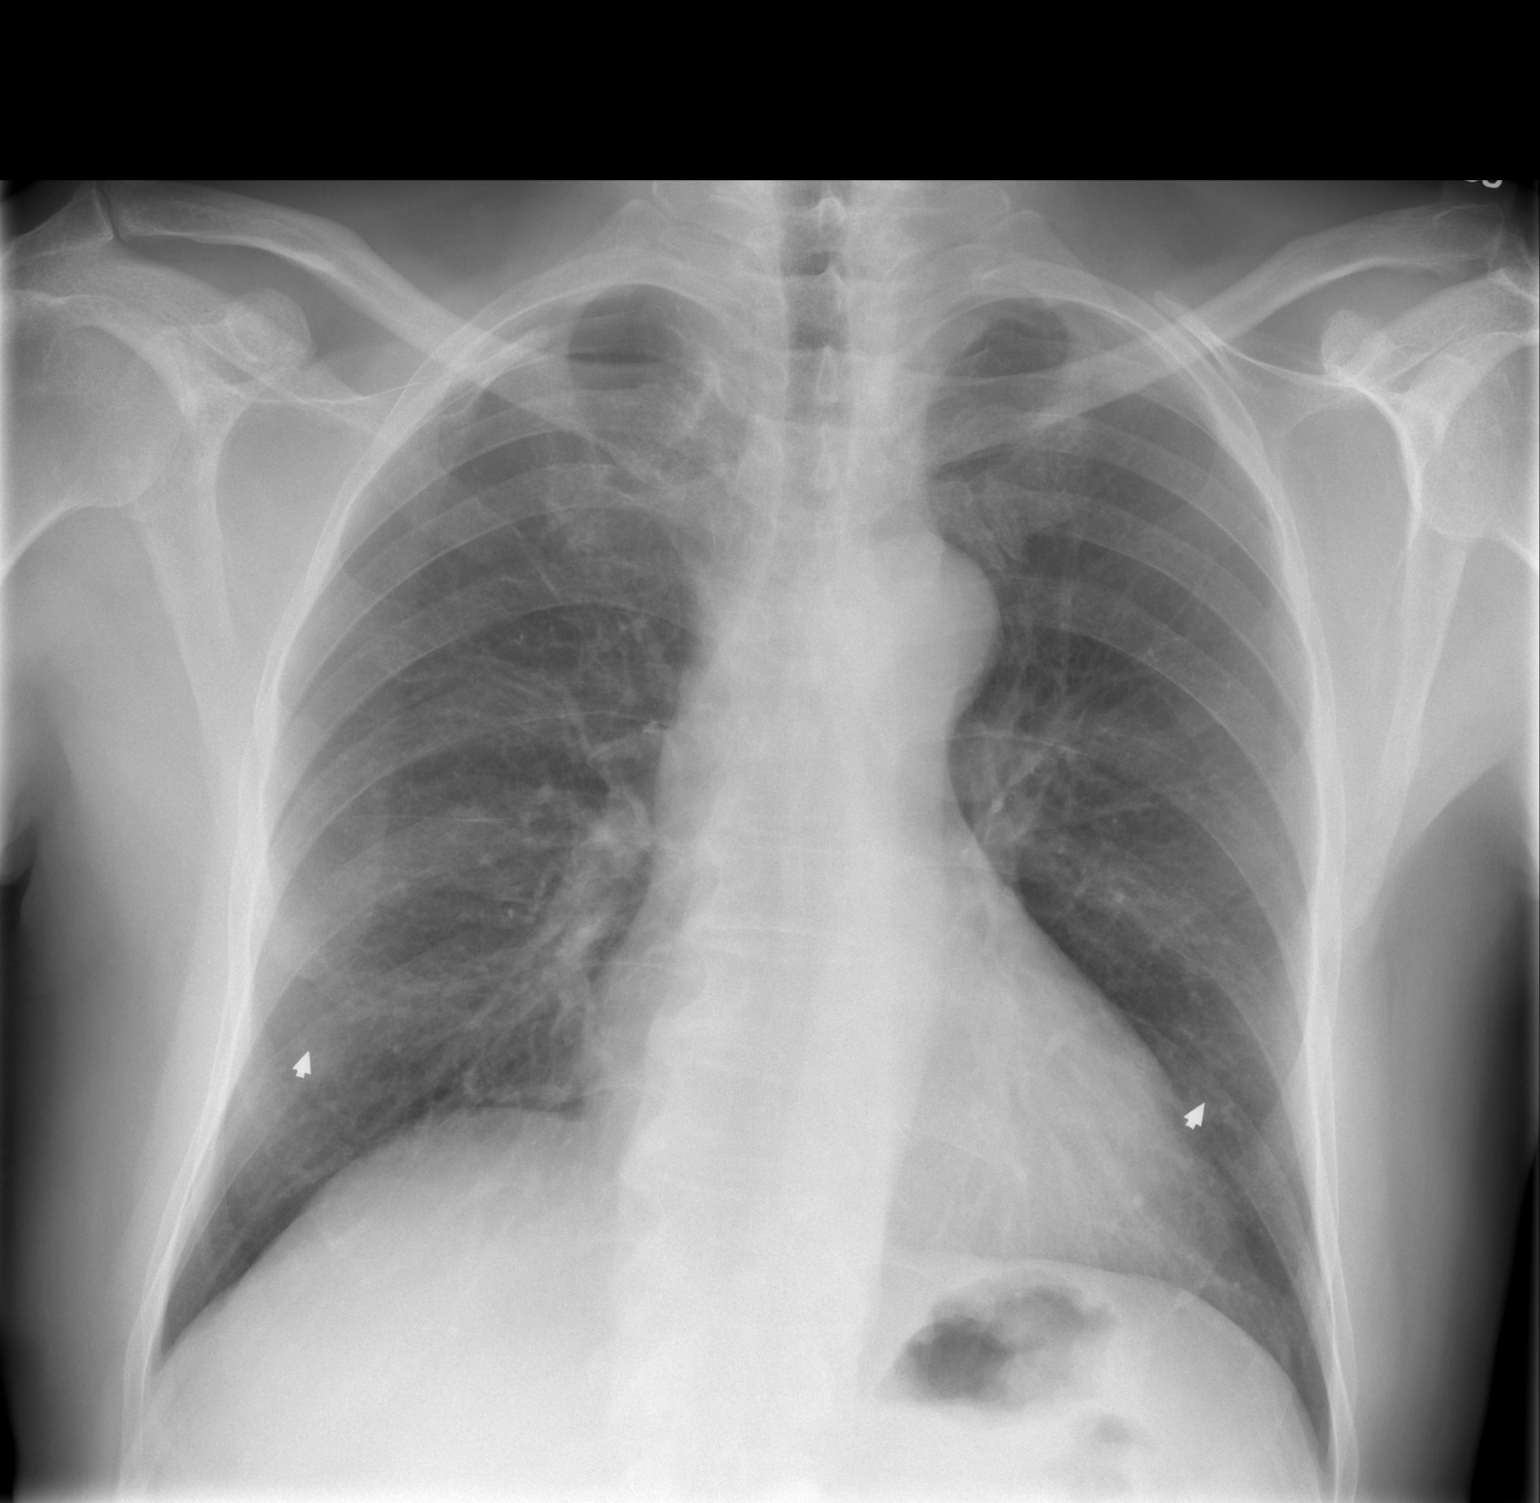

[1 of 1 positions shown; findings below may reference images not displayed]

FINDINGS: No pulmonary nodule is identified.  The lungs are clear.
Heart size is upper normal.  No pneumothorax or effusion.
IMPRESSION: Negative examination.  No pulmonary nodule.

## 2011-02-17 ENCOUNTER — Telehealth: Payer: Self-pay | Admitting: Oncology

## 2011-02-17 NOTE — Telephone Encounter (Signed)
S/w the pt's wife and she is aware of march 2013 appts

## 2011-03-15 ENCOUNTER — Other Ambulatory Visit: Payer: Self-pay | Admitting: Gastroenterology

## 2011-04-14 ENCOUNTER — Encounter: Payer: Self-pay | Admitting: Oncology

## 2011-04-14 ENCOUNTER — Other Ambulatory Visit (HOSPITAL_BASED_OUTPATIENT_CLINIC_OR_DEPARTMENT_OTHER): Payer: BC Managed Care – PPO | Admitting: Lab

## 2011-04-14 ENCOUNTER — Ambulatory Visit (HOSPITAL_BASED_OUTPATIENT_CLINIC_OR_DEPARTMENT_OTHER): Payer: BC Managed Care – PPO | Admitting: Oncology

## 2011-04-14 VITALS — BP 146/82 | HR 70 | Temp 97.7°F | Ht 68.5 in | Wt 188.6 lb

## 2011-04-14 DIAGNOSIS — C182 Malignant neoplasm of ascending colon: Secondary | ICD-10-CM

## 2011-04-14 DIAGNOSIS — Z85038 Personal history of other malignant neoplasm of large intestine: Secondary | ICD-10-CM | POA: Insufficient documentation

## 2011-04-14 LAB — CBC WITH DIFFERENTIAL/PLATELET
BASO%: 0.2 % (ref 0.0–2.0)
Basophils Absolute: 0 10*3/uL (ref 0.0–0.1)
HCT: 43.1 % (ref 38.4–49.9)
HGB: 14.9 g/dL (ref 13.0–17.1)
MCHC: 34.6 g/dL (ref 32.0–36.0)
MONO#: 0.6 10*3/uL (ref 0.1–0.9)
NEUT%: 70.1 % (ref 39.0–75.0)
RDW: 12.6 % (ref 11.0–14.6)
WBC: 6.4 10*3/uL (ref 4.0–10.3)
lymph#: 1.3 10*3/uL (ref 0.9–3.3)

## 2011-04-14 LAB — COMPREHENSIVE METABOLIC PANEL
ALT: 19 U/L (ref 0–53)
AST: 21 U/L (ref 0–37)
Albumin: 4.5 g/dL (ref 3.5–5.2)
CO2: 25 mEq/L (ref 19–32)
Calcium: 9.1 mg/dL (ref 8.4–10.5)
Chloride: 105 mEq/L (ref 96–112)
Creatinine, Ser: 0.77 mg/dL (ref 0.50–1.35)
Potassium: 4 mEq/L (ref 3.5–5.3)

## 2011-04-14 LAB — LACTATE DEHYDROGENASE: LDH: 153 U/L (ref 94–250)

## 2011-04-14 NOTE — Progress Notes (Signed)
CC:   Alan Riley. Alan Riley, M.D. Alan Riley, M.D. Alan Riley, M.D.  PROBLEM LIST: 1. Moderately differentiated near obstructing adenocarcinoma involving     the ascending colon with 6/14 positive lymph nodes, T3 N2, stage     IIIC, status post hemicolectomy on 01/15/2003.  Adjuvant     chemotherapy with FOLFOX 12 cycles were administered from     03/04/2003 through August 2005.  The patient remains without     evidence of recurrent disease. 2. History of adenomatous colonic polyps with most recent colonoscopy     on 03/15/2011. 3. History of iron-deficiency anemia secondary to colon cancer.  The patient is not on any medications at this time.  HISTORY:  Alan Riley was seen today for followup of his stage IIIC adenocarcinoma of the ascending colon dating back to December 2004.  The patient remains without evidence of disease.  He is without any complaints.  He continues to work at [employer].  The patient tells me that he underwent a colonoscopy by Dr. Matthias Hughs on 03/15/2011. He did have some adenomatous polyps present.  He is without any complaints today.  Denies any obvious blood in his stools.  He feels fine and as usual is in good spirits.  PHYSICAL EXAM:  He looks well.  Weight today is 188.6 pounds up from 180.5 pounds 6 months ago.  Body surface area 2.03 sq/m.  Blood pressure 146/82.  Other vital signs are normal.  There is no scleral icterus. Mouth and pharynx are benign.  No peripheral adenopathy palpable.  Heart and lungs are normal.  Abdomen:  Benign with no organomegaly or masses palpable.  Extremities:  No peripheral edema or clubbing.  Neurologic: Grossly normal.  The patient did have a Port-A-Cath that was removed on September 25, 2007.  LABORATORY DATA:  Today, white count 6.4, ANC 4.5, hemoglobin 14.9, hematocrit 43.1, platelets 152,000.  Chemistries and CEA are pending. Chemistries from 10/14/2010 were normal.  CEA was 0.8.  IMAGING  STUDIES: 1. CT scan of abdomen and pelvis with IV contrast from 12/25/2009     showed a small-bowel obstruction in the mid portion of the jejunum     likely relates to adhesive disease.  There were postsurgical     changes of the right hemicolectomy without obstruction at the     anastomotic site. 2. Chest x-ray, 2 view, from 10/14/2010 showed possible right     pulmonary nodule versus nipple shadow.  Repeat PA view with nipple     markers was recommended. 3. Chest x-ray, special view, from 10/14/2010 showed no pulmonary     nodule and was a normal exam.  IMPRESSION AND PLAN:  Alan Riley continues to do well now over 8 years from the time of diagnosis.  As stated, he underwent a colonoscopy by Dr. Matthias Hughs on 03/15/2011.  This showed adenomatous polyps.  I suspect that Dr. Matthias Hughs will be carrying out another colonoscopy within the next few years.  We will plan to see Alan Riley again in 1 year at which time will check CBC and chemistries.  He may be due for a chest x-ray at that time as well.    ______________________________ Samul Dada, M.D. DSM/MEDQ  D:  04/14/2011  T:  04/14/2011  Job:  [id number]

## 2011-04-14 NOTE — Progress Notes (Signed)
This office note has been dictated.  #[id number]

## 2011-12-09 ENCOUNTER — Ambulatory Visit (INDEPENDENT_AMBULATORY_CARE_PROVIDER_SITE_OTHER): Payer: BC Managed Care – PPO | Admitting: Family Medicine

## 2011-12-09 ENCOUNTER — Encounter: Payer: Self-pay | Admitting: Family Medicine

## 2011-12-09 VITALS — BP 137/73 | HR 65 | Temp 98.6°F | Ht 68.0 in | Wt 185.0 lb

## 2011-12-09 DIAGNOSIS — Z125 Encounter for screening for malignant neoplasm of prostate: Secondary | ICD-10-CM | POA: Insufficient documentation

## 2011-12-09 DIAGNOSIS — R7301 Impaired fasting glucose: Secondary | ICD-10-CM | POA: Insufficient documentation

## 2011-12-09 DIAGNOSIS — R351 Nocturia: Secondary | ICD-10-CM | POA: Insufficient documentation

## 2011-12-09 LAB — LIPID PANEL
Cholesterol: 165 mg/dL (ref 0–200)
LDL Cholesterol: 92 mg/dL (ref 0–99)
Total CHOL/HDL Ratio: 4.7 Ratio
VLDL: 38 mg/dL (ref 0–40)

## 2011-12-09 LAB — POCT UA - MICROSCOPIC ONLY

## 2011-12-09 LAB — POCT URINALYSIS DIPSTICK
Glucose, UA: NEGATIVE
Leukocytes, UA: NEGATIVE
Nitrite, UA: NEGATIVE
Protein, UA: NEGATIVE
Spec Grav, UA: >=1.03
Urobilinogen, UA: 0.2

## 2011-12-09 LAB — BASIC METABOLIC PANEL
CO2: 29 mEq/L (ref 19–32)
Calcium: 9.2 mg/dL (ref 8.4–10.5)
Creat: 0.76 mg/dL (ref 0.50–1.35)
Sodium: 140 mEq/L (ref 135–145)

## 2011-12-09 LAB — PSA: PSA: 1.27 ng/mL (ref <=4.00)

## 2011-12-09 NOTE — Progress Notes (Signed)
  Subjective:    Patient ID: Alan Riley, male    DOB: 1945-02-02, 66 y.o.   MRN: [id number]  HPI Patient new to this office, previously at [facility]. Patient's wife established him with our practice today.    Patient for general exam; requests prostate cancer screening.  No family history of prostate cancer.  Patient himself was diagnosed with colon adenocarcinoma stage IIIC in December 2004, see Dr Murinson's notes in Epic for more details.  Now seeing oncology yearly, last seen March of this year.  Reports he is doing well overall.  Family Hx: Mother and brother with DM.  No prostate Ca in family.  Social Hx; Patient never-smoker; used to drink alcohol but quit over 10 years ago.  He has limited [place] proficiency, is offered telephone interpreter for this visit.  He defers to his wife as his interpreter for this visit.      Review of Systems  No fevers or chills, no marked changes in weight; no chest pain, no dyspnea, no cough, no abd pain.  Does have 1-2 episodes nocturia per night with decreased stream.  No pain to void.  No hematuria.  No swelling in ankles.      Objective:   Physical Exam  Well appearing, no apparent distress HEENT neck spple, no thyroid nodules. No cervical adenopatjhy COR Regular S1S2 PULM Clear bilaterally, no rales or wheezes ABD Soft, nontender, nondistended. Normal audible bowel sounds.  PROSTATE: Soft, nontender, nonnodular.  Symmetrically enlarged diffusely. EXTS no edema noted in ankles.       Assessment & Plan:

## 2011-12-09 NOTE — Assessment & Plan Note (Signed)
Negative DRE today; counseled on pros and cons of prostate cancer screening.  Will order PSA today.  If not elevated, then to consider initiating treatment for likely BPH and reassess.  UA today is unremarkable in the office.

## 2011-12-09 NOTE — Patient Instructions (Addendum)
It was a pleasure to become your new family doctor today!    Regarding prostate cancer screening, I will contact you with the results of the PSA blood test that we did today.  Your prostate exam in the office is not worrisome (feels a little enlarged, which can happen with age).    I am checking your fasting labs today to check your sugar and cholesterol panel.  I am checking your urine because of your report of some decreased urinary stream and getting up at night to urinate.  After these tests, we may consider a medication to help treat your urinary symptoms.   Your lab results will be available to your cancer doctor, Dr Arline Asp, on the electronic record as well.

## 2011-12-09 NOTE — Assessment & Plan Note (Signed)
Prior elevated glucose while fasting; will order A1C today. Family Hx of DM.

## 2011-12-12 ENCOUNTER — Encounter: Payer: Self-pay | Admitting: Family Medicine

## 2012-03-14 ENCOUNTER — Telehealth: Payer: Self-pay | Admitting: Oncology

## 2012-04-13 ENCOUNTER — Other Ambulatory Visit: Payer: BC Managed Care – PPO | Admitting: Lab

## 2012-04-13 ENCOUNTER — Ambulatory Visit: Payer: BC Managed Care – PPO | Admitting: Oncology

## 2012-04-20 ENCOUNTER — Other Ambulatory Visit: Payer: Self-pay | Admitting: Medical Oncology

## 2012-04-20 DIAGNOSIS — Z85038 Personal history of other malignant neoplasm of large intestine: Secondary | ICD-10-CM

## 2012-04-21 ENCOUNTER — Inpatient Hospital Stay (HOSPITAL_COMMUNITY)
Admission: EM | Admit: 2012-04-21 | Discharge: 2012-04-25 | DRG: 390 | Disposition: A | Discharge status: Home / Self Care | Payer: PRIVATE HEALTH INSURANCE | Attending: Surgery | Admitting: Surgery

## 2012-04-21 ENCOUNTER — Emergency Department (HOSPITAL_COMMUNITY): Payer: PRIVATE HEALTH INSURANCE

## 2012-04-21 ENCOUNTER — Encounter (HOSPITAL_COMMUNITY): Payer: Self-pay | Admitting: Neurology

## 2012-04-21 DIAGNOSIS — K56609 Unspecified intestinal obstruction, unspecified as to partial versus complete obstruction: Principal | ICD-10-CM | POA: Diagnosis present

## 2012-04-21 DIAGNOSIS — D500 Iron deficiency anemia secondary to blood loss (chronic): Secondary | ICD-10-CM | POA: Diagnosis present

## 2012-04-21 DIAGNOSIS — Z85038 Personal history of other malignant neoplasm of large intestine: Secondary | ICD-10-CM

## 2012-04-21 LAB — COMPREHENSIVE METABOLIC PANEL
ALT: 20 U/L (ref 0–53)
AST: 20 U/L (ref 0–37)
CO2: 28 mEq/L (ref 19–32)
Chloride: 101 mEq/L (ref 96–112)
Creatinine, Ser: 0.84 mg/dL (ref 0.50–1.35)
GFR calc Af Amer: >90 mL/min (ref >90)
GFR calc non Af Amer: 90 mL/min — ABNORMAL LOW (ref >90)
Glucose, Bld: 129 mg/dL — ABNORMAL HIGH (ref 70–99)
Sodium: 139 mEq/L (ref 135–145)
Total Bilirubin: 0.5 mg/dL (ref 0.3–1.2)

## 2012-04-21 LAB — CBC WITH DIFFERENTIAL/PLATELET
HCT: 43.1 % (ref 39.0–52.0)
Lymphocytes Relative: 6 % — ABNORMAL LOW (ref 12–46)
MCHC: 35 g/dL (ref 30.0–36.0)
Monocytes Absolute: 0.9 10*3/uL (ref 0.1–1.0)
Neutro Abs: 12.2 10*3/uL — ABNORMAL HIGH (ref 1.7–7.7)
Platelets: 162 10*3/uL (ref 150–400)
RDW: 12.4 % (ref 11.5–15.5)
WBC: 13.9 10*3/uL — ABNORMAL HIGH (ref 4.0–10.5)

## 2012-04-21 IMAGING — CT CT ABD-PELV W/ CM
2 of 5 series · 14 of 32 positions shown, 19 images · IV contrast (water/omni  & 100ml omni 300)
Comparison: [DATE]

CLINICAL DATA: Abdominal pain since this morning progressively
worse, history of colon carcinoma of ascending colon

CT ABDOMEN AND PELVIS WITH CONTRAST
TECHNIQUE: Multidetector CT imaging of the abdomen and pelvis was
performed following the standard protocol during bolus
administration of intravenous contrast.
 Sagittal and coronal MPR images reconstructed from axial data set.
Contrast: 100mL OMNIPAQUE IOHEXOL 300 MG/ML  SOLN Dilute oral
contrast.

[Series 2: routine abdomen · axial · 0.80mm/px · z∈[-406,-36]mm · 7 of 100 slices shown, 12 images]
[im 13/100  soft-tissue]
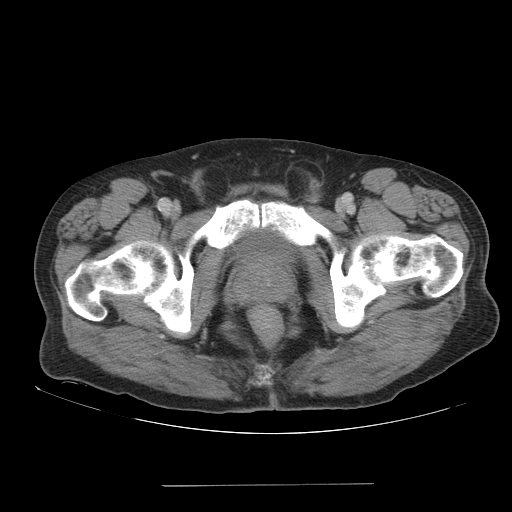
[im 13/100  bone]
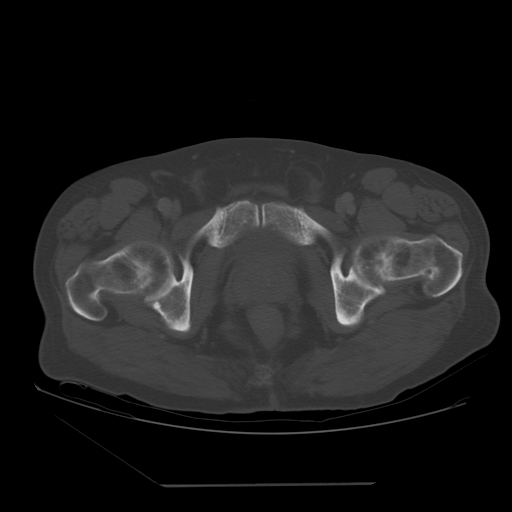
[im 25/100  soft-tissue]
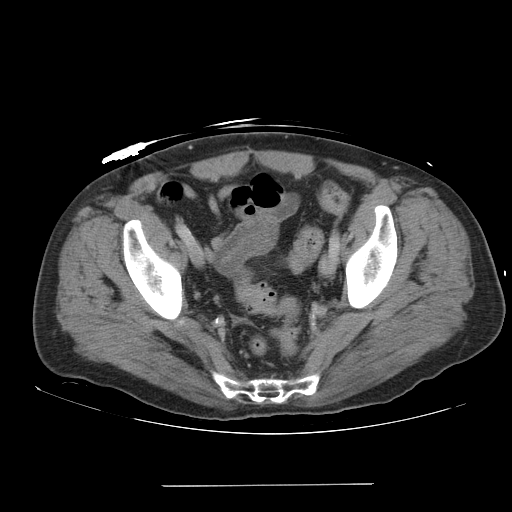
[im 38/100  soft-tissue]
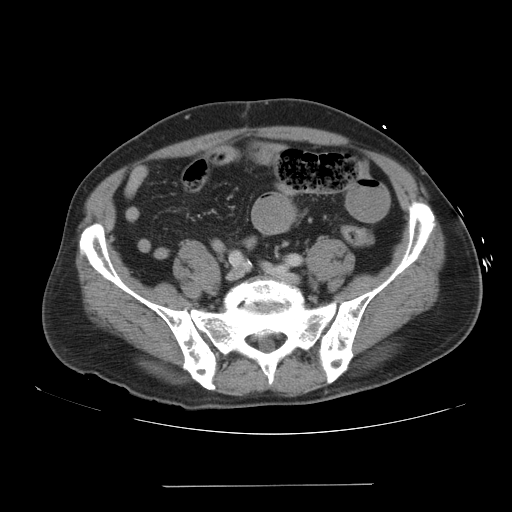
[im 50/100  soft-tissue]
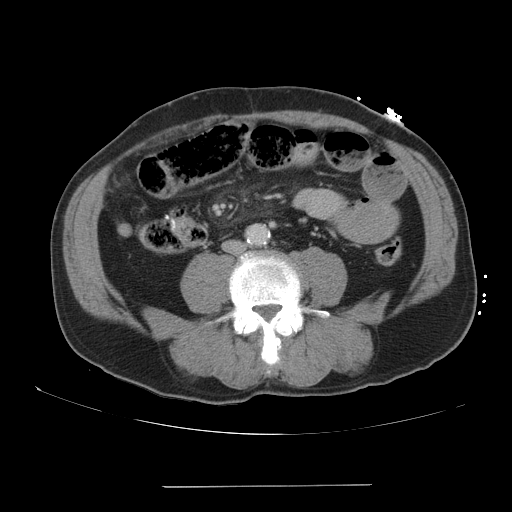
[im 50/100  lung]
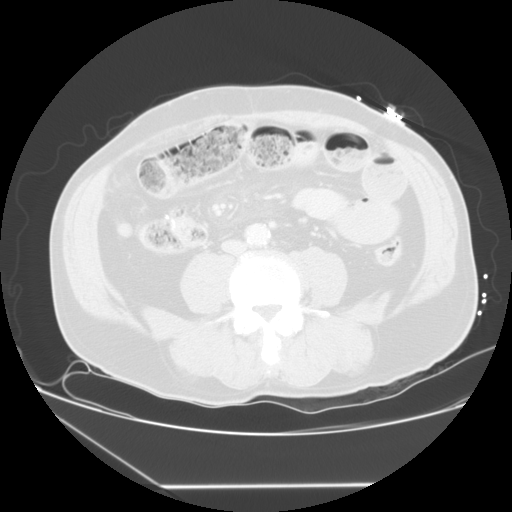
[im 62/100  soft-tissue]
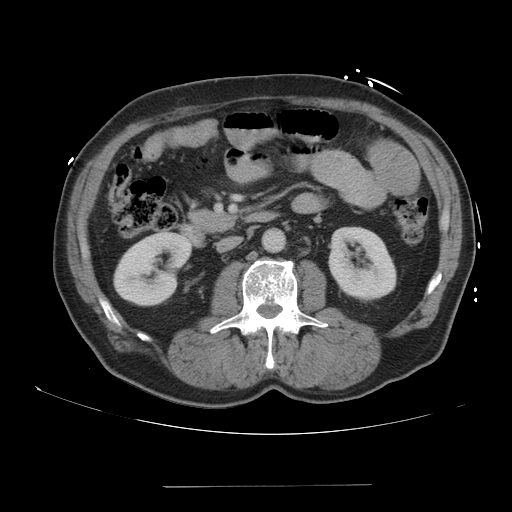
[im 62/100  lung]
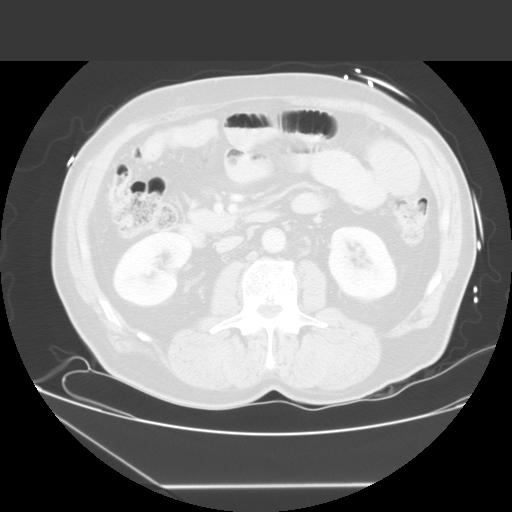
[im 75/100  soft-tissue]
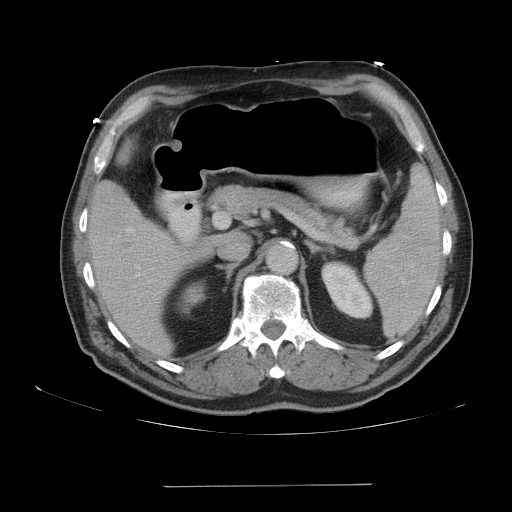
[im 75/100  lung]
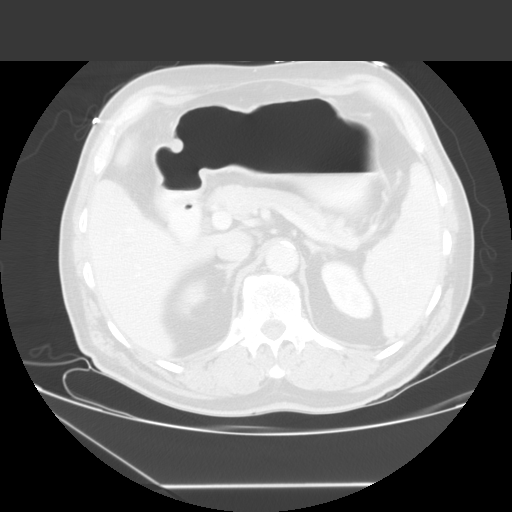
[im 87/100  soft-tissue]
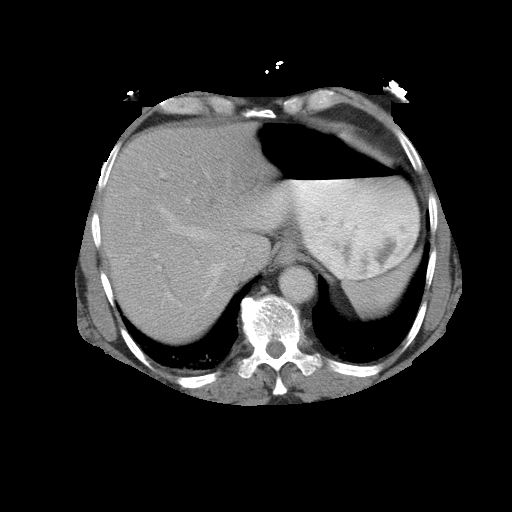
[im 87/100  lung]
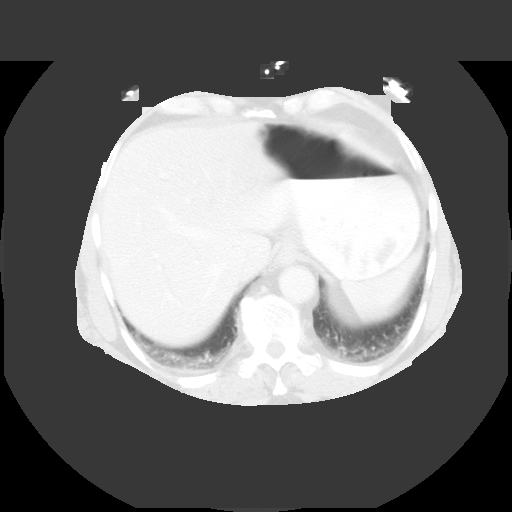

[Series 401: sagittals · sagittal · 0.99mm/px · 7 of 113 slices shown]
[im 13/113  soft-tissue]
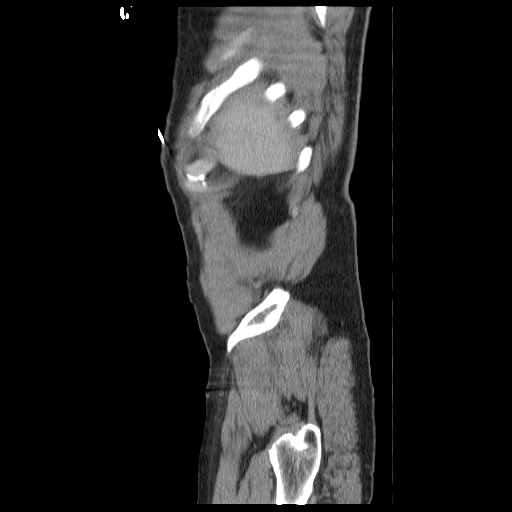
[im 25/113  soft-tissue]
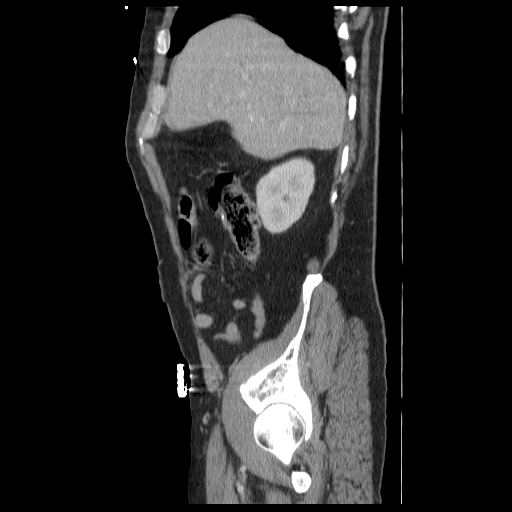
[im 38/113  soft-tissue]
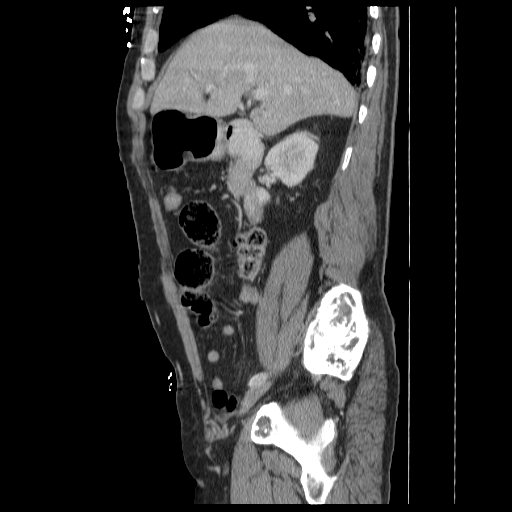
[im 50/113  soft-tissue]
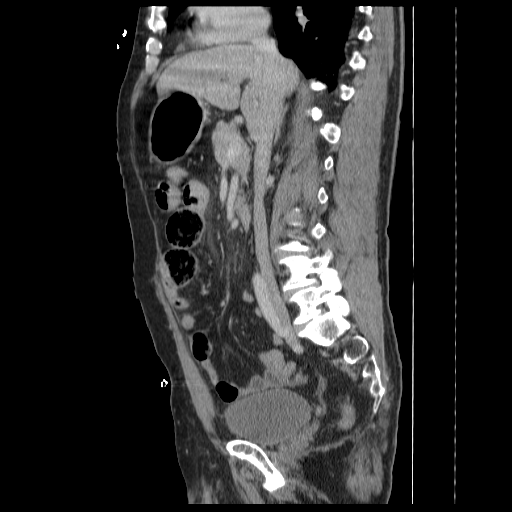
[im 63/113  soft-tissue]
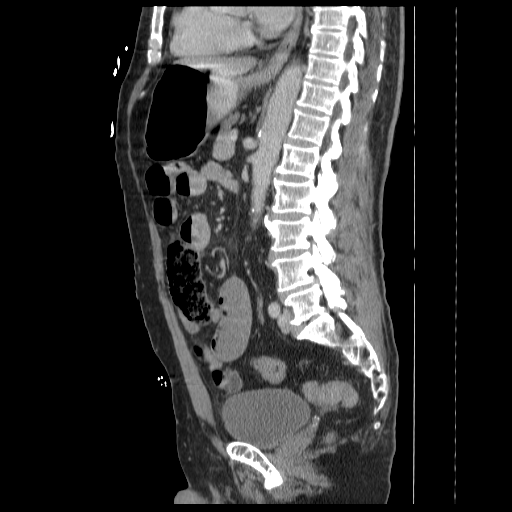
[im 75/113  soft-tissue]
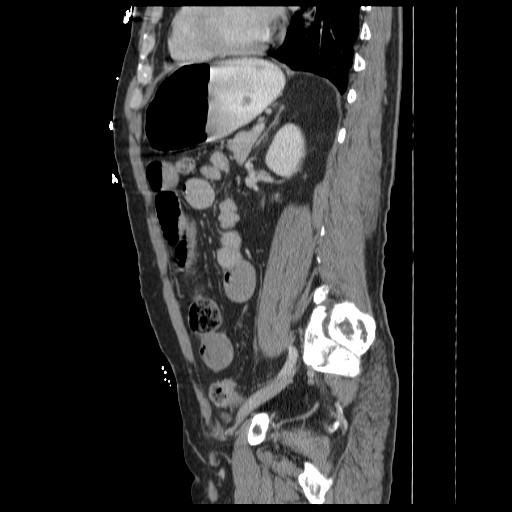
[im 88/113  soft-tissue]
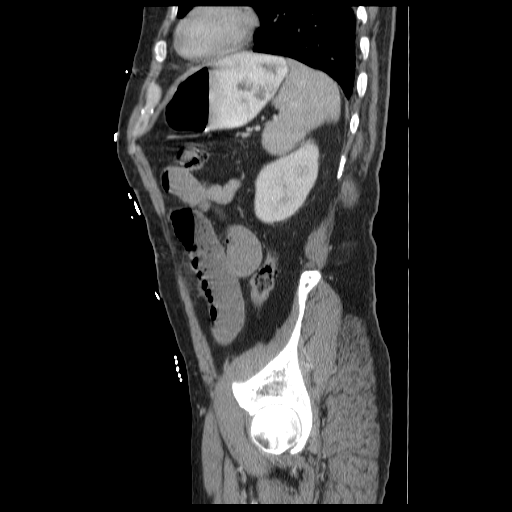

[14 of 32 positions shown; findings below may reference images not displayed]

FINDINGS: Bibasilar atelectasis.
Gallbladder surgically absent.
Liver, spleen, pancreas, kidneys, and adrenal glands normal
appearance.
Chronic infiltration of mesenteric fat in upper abdomen due to
fibrosing mesenteritis.
Prior right ileocolic resection.

Dilated proximal small bowel loops with decompressed distal small
bowel loops consistent with mid small bowel obstruction.
Area of transition is in the mid abdomen near midline image 57,
where minimal wall thickening and enhancement of the small bowel
wall is identified.
This could represent an inflammatory or neoplastic process or wall
thickening at an adhesion.
Distal small bowel loops and colon decompressed.

Small amount of nonspecific low attenuation free pelvic fluid.
Dilated internal inguinal rings and proximal inguinal canals
consistent with fat containing inguinal hernias.
Stomach unremarkable.
No definite mass, adenopathy, or free air.
Bones demineralized.
IMPRESSION: Mid small bowel obstruction with transition zone in the anterior
mid abdomen where enhancement of wall thickening of a small bowel
loop are seen, differential diagnosis including adhesion with wall
thickening, neoplasm, or inflammatory process.
No evidence of perforation or adenopathy.
Chronic mesenteric infiltration consistent with fibrosing
mesenteritis.
Small bilateral inguinal hernias containing fat.

## 2012-04-21 IMAGING — CR DG ABDOMEN ACUTE W/ 1V CHEST
3 series · 3 of 3 positions shown · non-contrast
Comparison: Previous examinations.

CLINICAL DATA: Abdominal pain.

ACUTE ABDOMEN SERIES (ABDOMEN 2 VIEW & CHEST 1 VIEW)

[w chest pa]
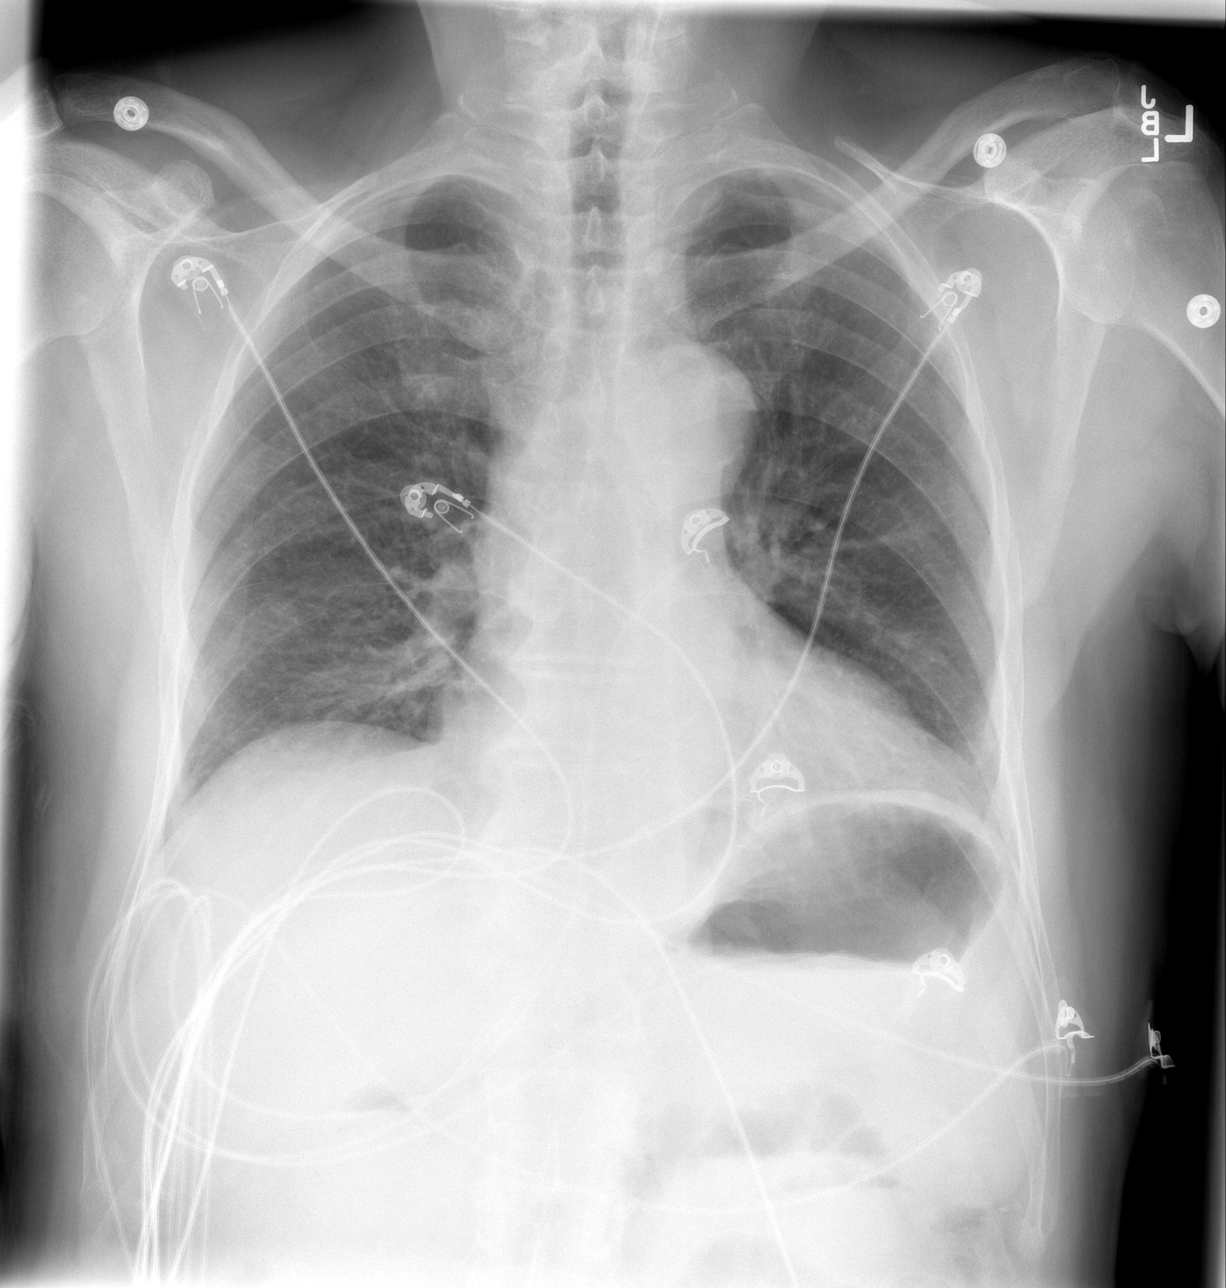

[w abdomen upright]
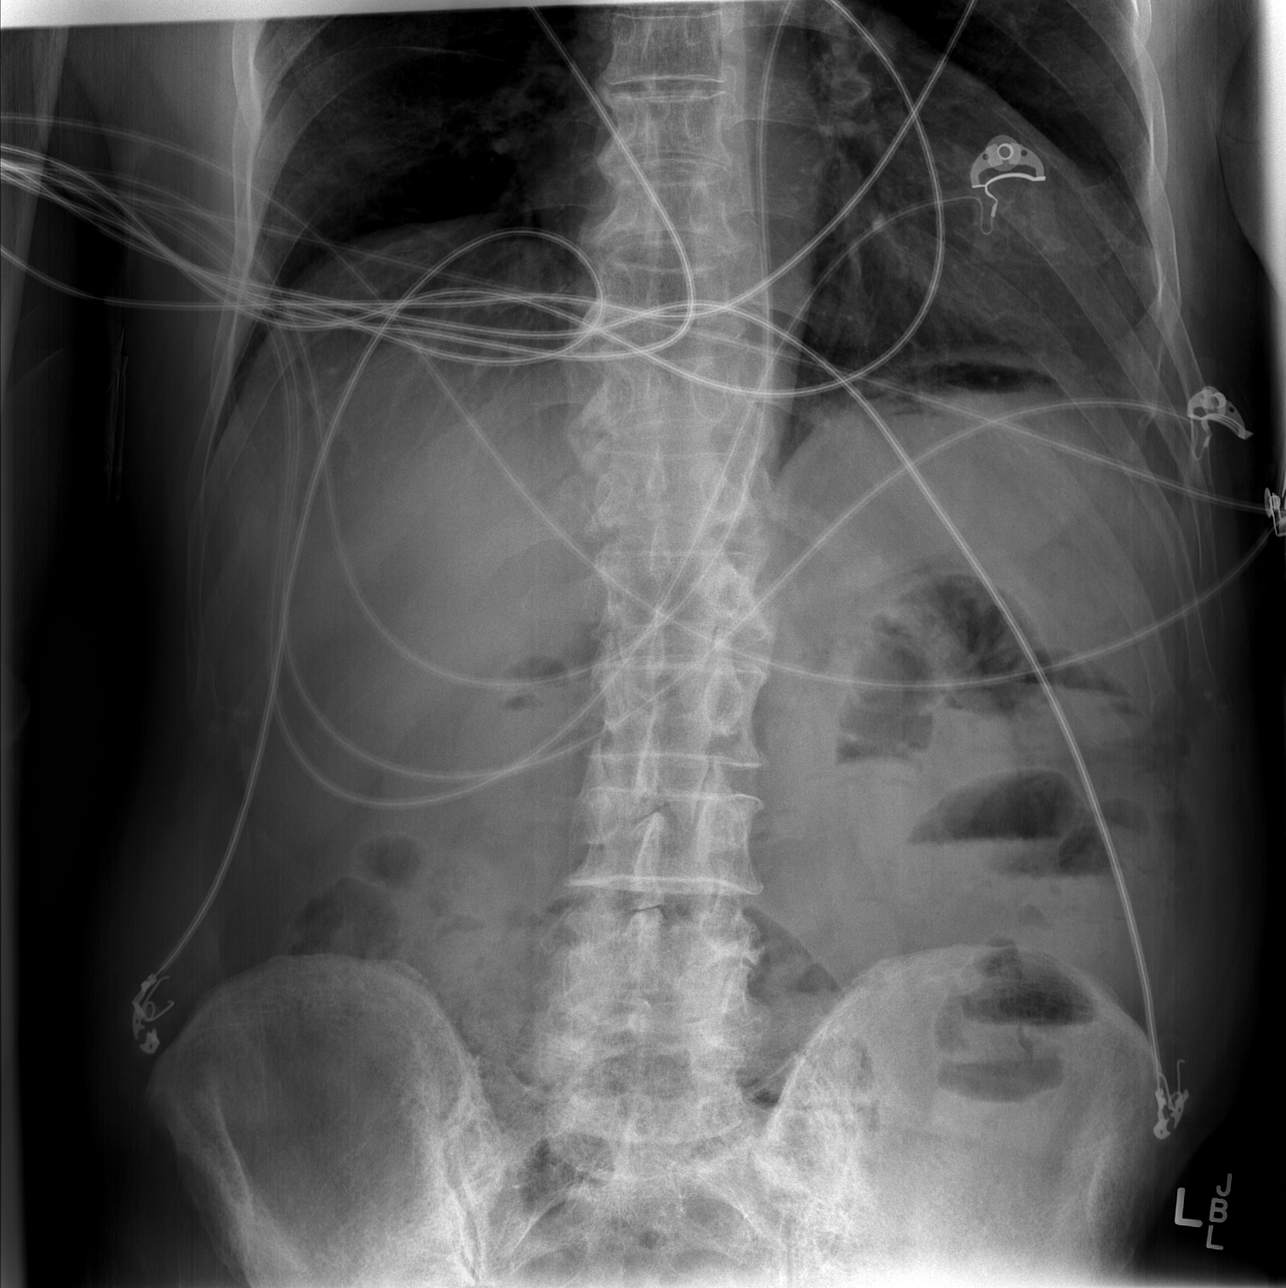

[t abdomen supine]
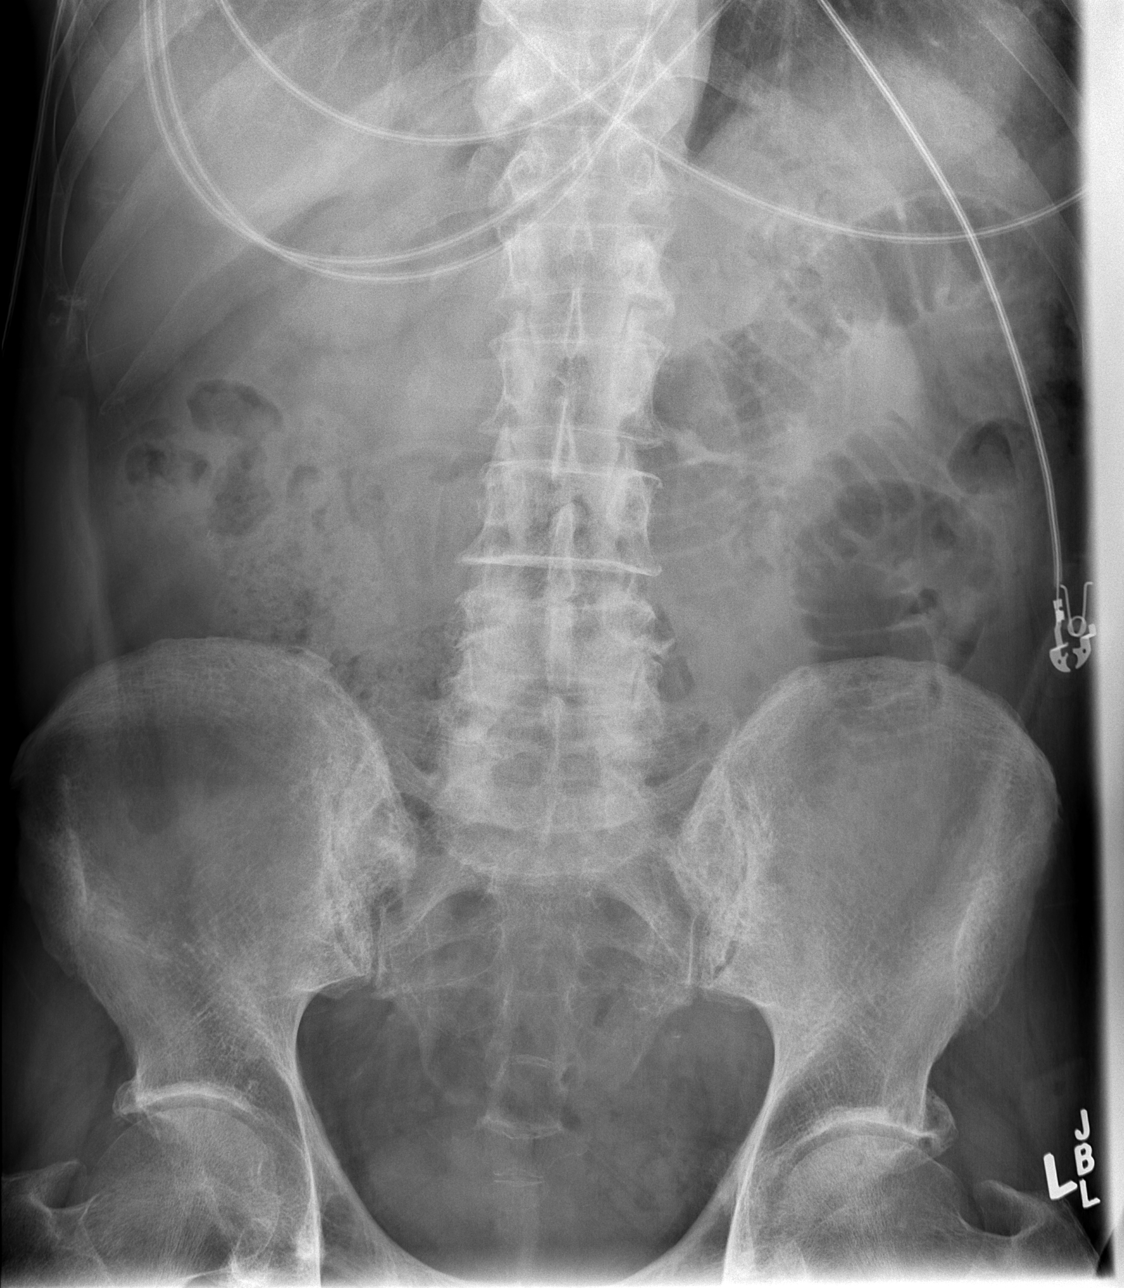

[3 of 3 positions shown; findings below may reference images not displayed]

FINDINGS: Poor inspiration.  No gross change in borderline
enlargement of the cardiac silhouette.  Minimal atelectasis at the
right lung base and left mid lung zone.

Multiple mildly dilated small bowel loops with multiple air-fluid
levels.  No free peritoneal air.  Thoracic and lumbar spine
degenerative changes.
IMPRESSION: 1.  Mild partial small bowel obstruction or ileus.
2.  Minimal bilateral atelectasis.

## 2012-04-21 MED ORDER — IOHEXOL 300 MG/ML  SOLN
100.0000 mL | Freq: Once | INTRAMUSCULAR | Status: AC | PRN
  Administered 2012-04-21: 100 mL via INTRAVENOUS

## 2012-04-21 MED ORDER — MORPHINE SULFATE 4 MG/ML IJ SOLN
4.0000 mg | Freq: Once | INTRAMUSCULAR | Status: AC
  Administered 2012-04-21: 4 mg via INTRAVENOUS
  Filled 2012-04-21: qty 1

## 2012-04-21 MED ORDER — IOHEXOL 300 MG/ML  SOLN
50.0000 mL | INTRAMUSCULAR | Status: AC
  Administered 2012-04-21: 50 mL via ORAL

## 2012-04-21 MED ORDER — ONDANSETRON HCL 4 MG/2ML IJ SOLN
4.0000 mg | Freq: Once | INTRAMUSCULAR | Status: AC
  Administered 2012-04-21: 4 mg via INTRAVENOUS
  Filled 2012-04-21: qty 2

## 2012-04-21 NOTE — ED Notes (Signed)
Patient transported to X-ray 

## 2012-04-21 NOTE — ED Notes (Signed)
Pt's daughter stated that abdominal pain worsened and pt began vomiting after drinking the second cup of contrast.

## 2012-04-21 NOTE — ED Notes (Signed)
Per EMS- States generalized abdominal pain since this morning which has become worse. Reports stomach cancer, not being treated at current. HR 52, BP 140 systolic. 20 left AC. Speaks [place], poor english.

## 2012-04-21 NOTE — ED Notes (Signed)
Family at desk stating that pt continues to be in a lot of pain.  md aware and new orders obtained.

## 2012-04-21 NOTE — ED Notes (Signed)
In to see pt. Pt resting now.  States feeling some better.

## 2012-04-21 NOTE — ED Provider Notes (Signed)
History     CSN: [id number]  Arrival date & time 04/21/12  1736   First MD Initiated Contact with Patient 04/21/12 1747      Chief Complaint  Patient presents with  . Abdominal Pain    (Consider location/radiation/quality/duration/timing/severity/associated sxs/prior treatment) HPI Comments: Patient comes to the ER for evaluation of abdominal pain. Patient reports that he started having mild discomfort this morning. Over the course of the day his abdominal pain has progressively worsened, now is severe. He has had nausea and has vomited once. He has not had a bowel movement today, has felt constipated. Patient denies fever.  Patient is a 67 y.o. male presenting with abdominal pain. The history is provided by a relative and the patient. The history is limited by a language barrier. A language interpreter was used.  Abdominal Pain Associated symptoms: nausea and vomiting   Associated symptoms: no fever     Past Medical History  Diagnosis Date  . Malignant neoplasm of ascending colon   . Iron deficiency anemia secondary to blood loss (chronic)   . Hx of adenomatous colonic polyps     Past Surgical History  Procedure Laterality Date  . Hemicolectomy  01/15/03  . Cholecystectomy  1991    Family History  Problem Relation Age of Onset  . Diabetes Mother   . Diabetes Brother     History  Substance Use Topics  . Smoking status: Never Smoker   . Smokeless tobacco: Not on file  . Alcohol Use: Yes     Comment: occassional      Review of Systems  Constitutional: Negative for fever.  Gastrointestinal: Positive for nausea, vomiting and abdominal pain.  All other systems reviewed and are negative.    Allergies  Review of patient's allergies indicates no known allergies.  Home Medications   Current Outpatient Rx  Name  Route  Sig  Dispense  Refill  . Cholecalciferol (VITAMIN D PO)   Oral   Take by mouth.           BP 142/71  Pulse 64  Temp(Src) 97.9 F (36.6  C) (Oral)  Resp 20  SpO2 100%  Physical Exam  Constitutional: He appears distressed.  Abdominal: He exhibits distension. He exhibits no mass. There is generalized tenderness. There is no rebound and no guarding.    ED Course  Procedures (including critical care time)   Date: 04/21/2012  Rate: 66  Rhythm: normal sinus rhythm  QRS Axis: normal  Intervals: PR prolonged  ST/T Wave abnormalities: normal  Conduction Disutrbances:first-degree A-V block   Narrative Interpretation:   Old EKG Reviewed: none available    Labs Reviewed  CBC WITH DIFFERENTIAL - Abnormal; Notable for the following:    WBC 13.9 (*)    Neutrophils Relative 88 (*)    Neutro Abs 12.2 (*)    Lymphocytes Relative 6 (*)    All other components within normal limits  COMPREHENSIVE METABOLIC PANEL - Abnormal; Notable for the following:    Glucose, Bld 129 (*)    GFR calc non Af Amer 90 (*)    All other components within normal limits  LIPASE, BLOOD  LACTIC ACID, PLASMA  TROPONIN I   Ct Abdomen Pelvis W Contrast  04/21/2012  *RADIOLOGY REPORT*  Clinical Data: Abdominal pain since this morning progressively worse, history of colon carcinoma of ascending colon  CT ABDOMEN AND PELVIS WITH CONTRAST  Technique:  Multidetector CT imaging of the abdomen and pelvis was performed following the standard protocol during  bolus administration of intravenous contrast.  Sagittal and coronal MPR images reconstructed from axial data set.  Contrast: OMNIPAQUE IOHEXOL 300 MG/ML  SOLN Dilute oral contrast.  Comparison: 12/25/2009  Findings: Bibasilar atelectasis. Gallbladder surgically absent. Liver, spleen, pancreas, kidneys, and adrenal glands normal appearance. Chronic infiltration of mesenteric fat in upper abdomen due to fibrosing mesenteritis. Prior right ileocolic resection.  Dilated proximal small bowel loops with decompressed distal small bowel loops consistent with mid small bowel obstruction. Area of transition is in  the mid abdomen near midline image 57, where minimal wall thickening and enhancement of the small bowel wall is identified. This could represent an inflammatory or neoplastic process or wall thickening at an adhesion. Distal small bowel loops and colon decompressed.  Small amount of nonspecific low attenuation free pelvic fluid. Dilated internal inguinal rings and proximal inguinal canals consistent with fat containing inguinal hernias. Stomach unremarkable. No definite mass, adenopathy, or free air. Bones demineralized.  IMPRESSION: Mid small bowel obstruction with transition zone in the anterior mid abdomen where enhancement of wall thickening of a small bowel loop are seen, differential diagnosis including adhesion with wall thickening, neoplasm, or inflammatory process. No evidence of perforation or adenopathy. 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 Small bilateral inguinal hernias containing fat.   Original Report Authenticated By: Ulyses Southward, M.D.    Dg Abd Acute W/chest  04/21/2012  *RADIOLOGY REPORT*  Clinical Data: Abdominal pain.  ACUTE ABDOMEN SERIES (ABDOMEN 2 VIEW & CHEST 1 VIEW)  Comparison: Previous examinations.  Findings: Poor inspiration.  No gross change in borderline enlargement of the cardiac silhouette.  Minimal atelectasis at the right lung base and left mid lung zone.  Multiple mildly dilated small bowel loops with multiple air-fluid levels.  No free peritoneal air.  Thoracic and lumbar spine degenerative changes.  IMPRESSION:  1.  Mild partial small bowel obstruction or ileus. 2.  Minimal bilateral atelectasis.   Original Report Authenticated By: Beckie Salts, M.D.      Diagnosis: Small bowel obstruction    MDM  Patient comes to the ER for evaluation of abdominal pain with nausea and vomiting and decreased bowel movements. Patient had mildly distended abdomen but did have bowel sounds present. Plain film x-ray was concerning for partial small bowel  obstruction. CAT scan was performed to further evaluate, including to look for any mass that could be recurrent, as the patient has a history of colon cancer. CT scan does confirm mid small bowel obstruction with a transition zone present. Patient will require hospitalization for further management.        Gilda Crease, MD 04/21/12 2238

## 2012-04-22 ENCOUNTER — Inpatient Hospital Stay (HOSPITAL_COMMUNITY): Payer: PRIVATE HEALTH INSURANCE

## 2012-04-22 ENCOUNTER — Encounter (HOSPITAL_COMMUNITY): Payer: Self-pay | Admitting: *Deleted

## 2012-04-22 DIAGNOSIS — K56609 Unspecified intestinal obstruction, unspecified as to partial versus complete obstruction: Secondary | ICD-10-CM

## 2012-04-22 LAB — BASIC METABOLIC PANEL
BUN: 9 mg/dL (ref 6–23)
Calcium: 8.9 mg/dL (ref 8.4–10.5)
Creatinine, Ser: 0.79 mg/dL (ref 0.50–1.35)
GFR calc Af Amer: >90 mL/min (ref >90)
GFR calc non Af Amer: >90 mL/min (ref >90)
Glucose, Bld: 122 mg/dL — ABNORMAL HIGH (ref 70–99)
Potassium: 3.8 mEq/L (ref 3.5–5.1)

## 2012-04-22 LAB — CBC
Hemoglobin: 14.3 g/dL (ref 13.0–17.0)
MCH: 32.2 pg (ref 26.0–34.0)
MCHC: 34.5 g/dL (ref 30.0–36.0)
RDW: 12.7 % (ref 11.5–15.5)

## 2012-04-22 LAB — CEA: CEA: 0.8 ng/mL (ref 0.0–5.0)

## 2012-04-22 IMAGING — CR DG ABD PORTABLE 2V
2 series · 2 of 2 positions shown · non-contrast
Comparison: Abdominal CT [DATE]

CLINICAL DATA: Rule out bowel obstruction.

PORTABLE ABDOMEN - 2 VIEW

[AP]
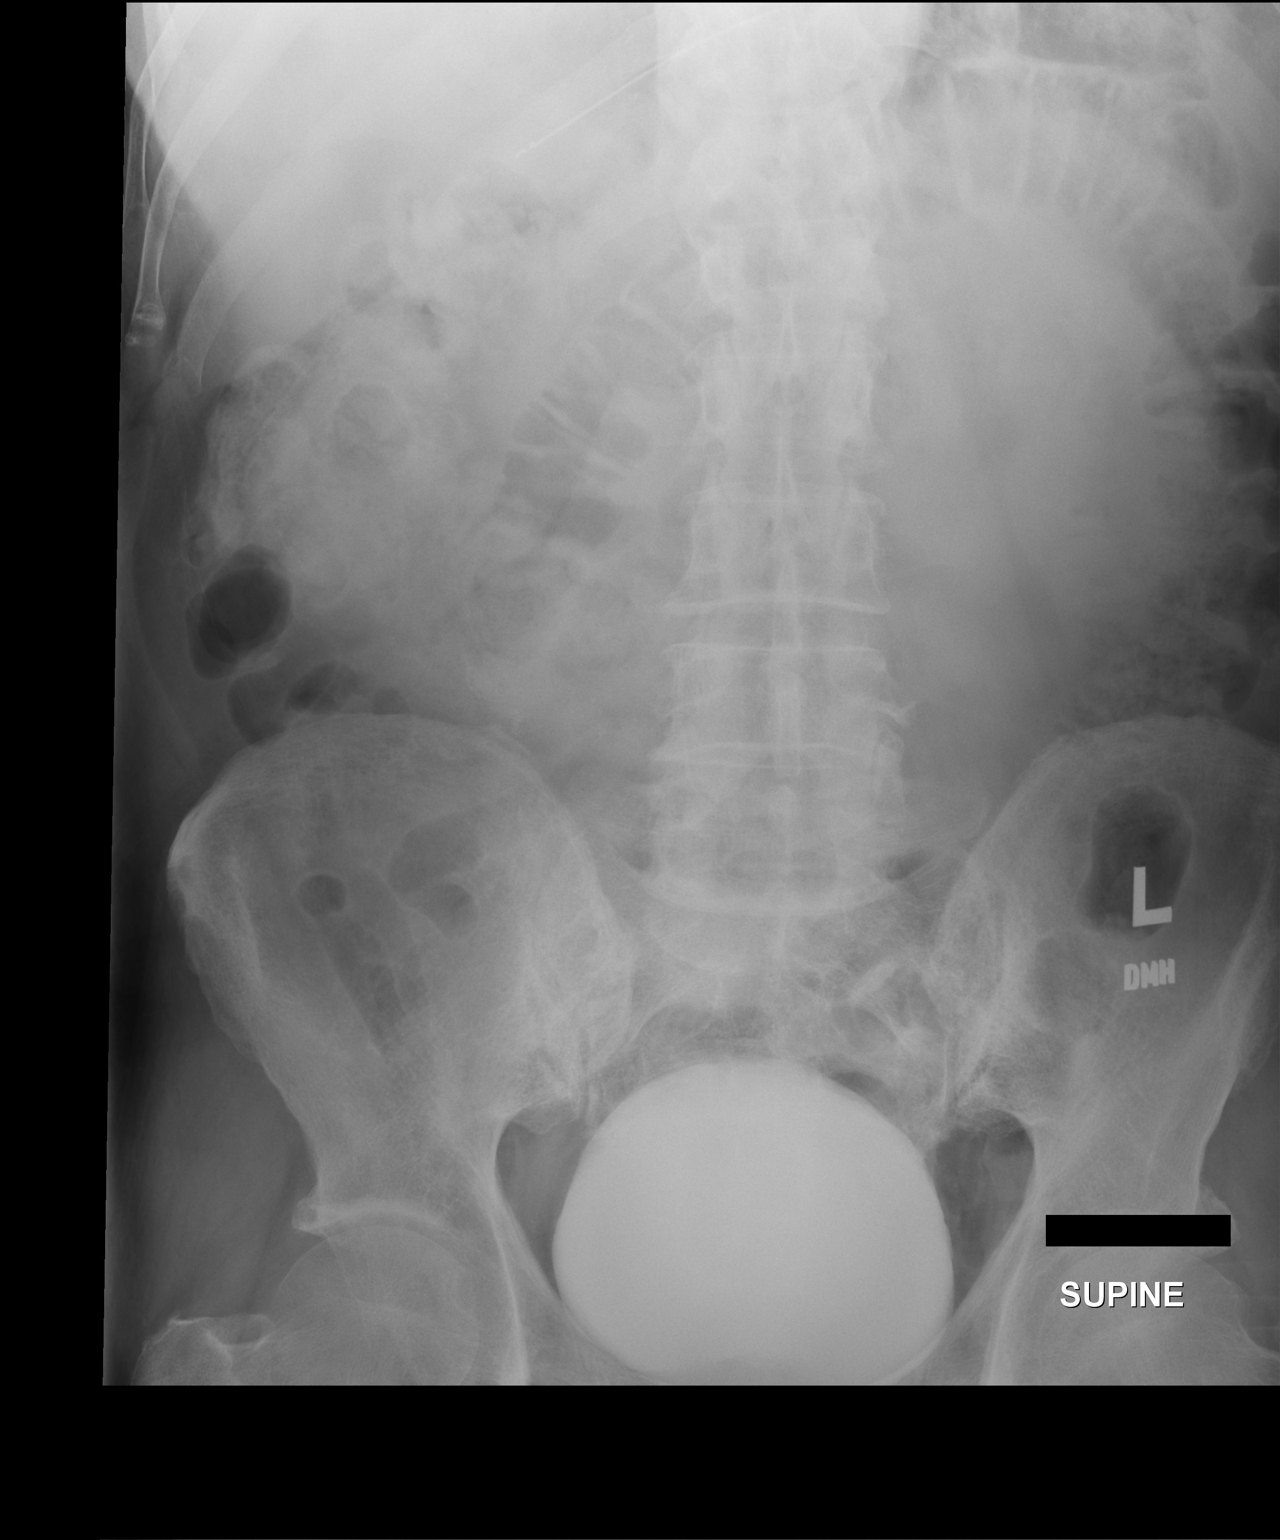

[ap lld]
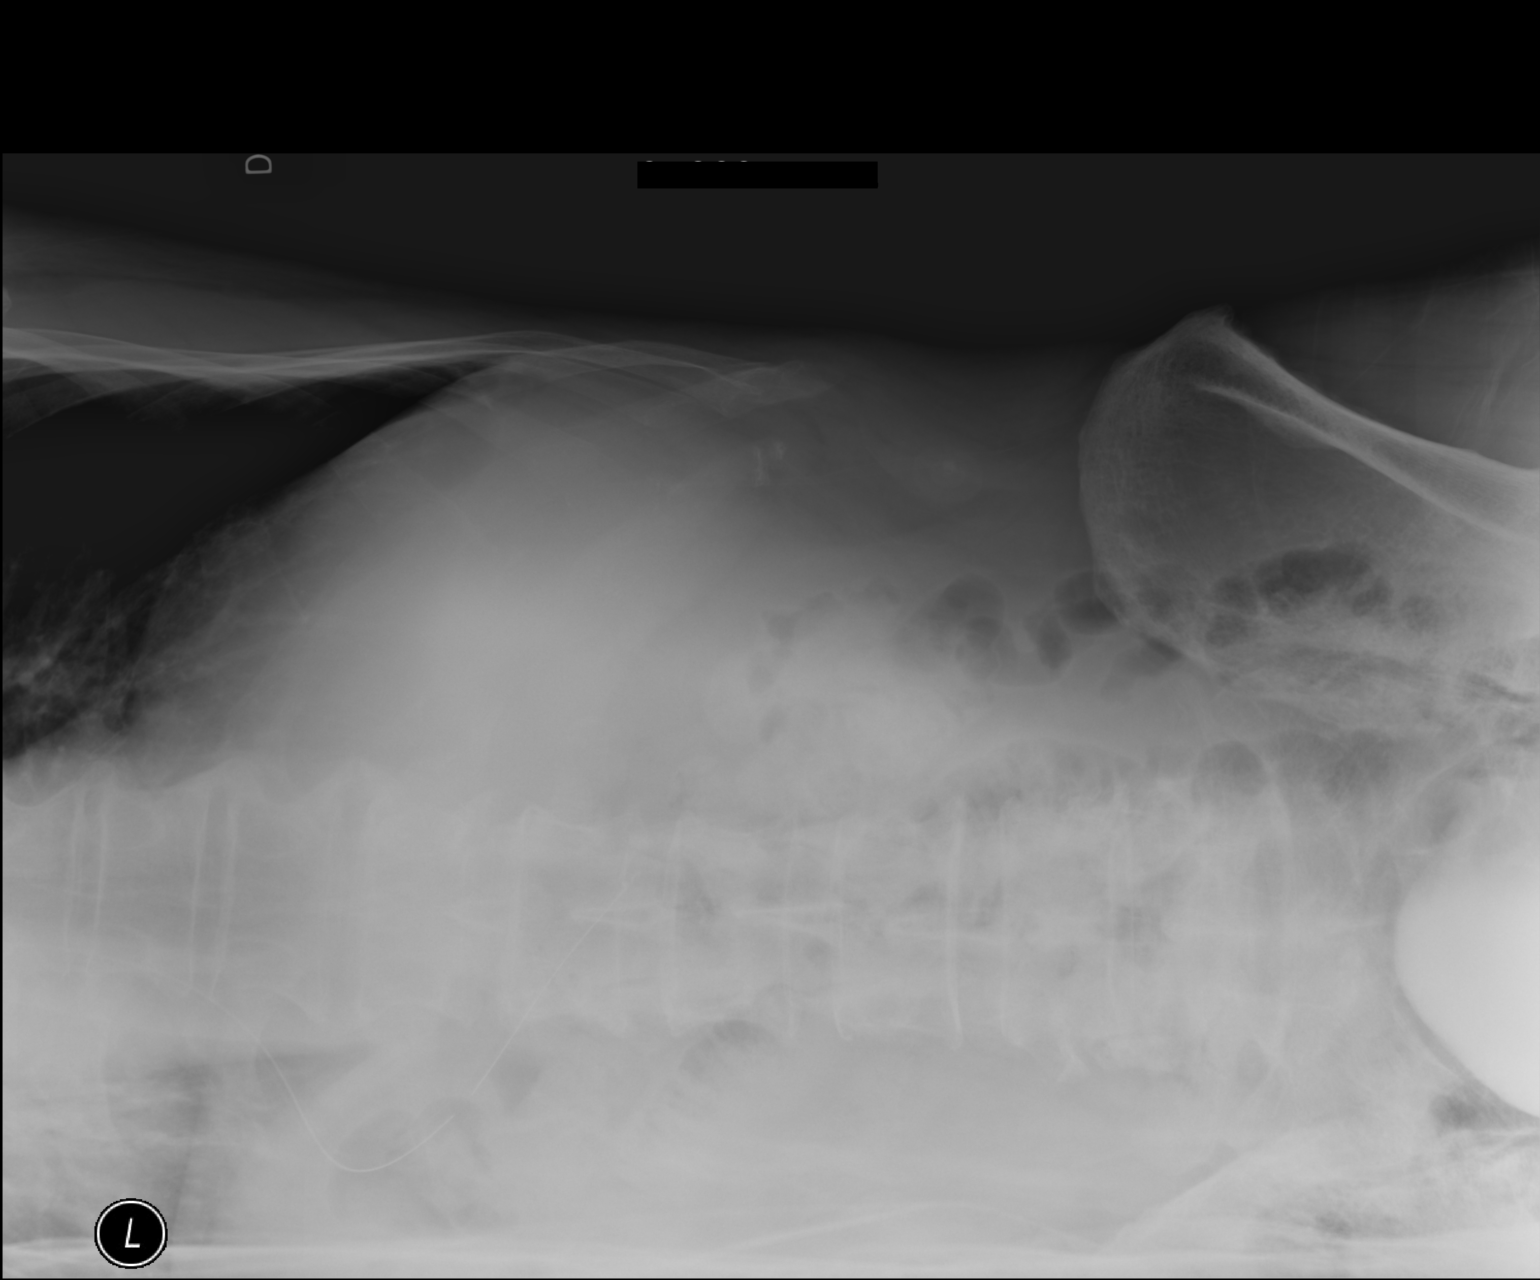

[2 of 2 positions shown; findings below may reference images not displayed]

FINDINGS: Left lateral decubitus image and supine image of the
abdomen was obtained.  There is no evidence for free air.  There is
a nasogastric in the stomach region. There is a dilated gas-filled
loop of small bowel involving the upper abdomen.  There is contrast
within the urinary bladder.
IMPRESSION: Dilated gas-filled loop of small bowel.  Findings remain compatible
with a small bowel obstruction.

Placement of nasogastric tube.

## 2012-04-22 MED ORDER — POTASSIUM CHLORIDE IN NACL 20-0.9 MEQ/L-% IV SOLN
INTRAVENOUS | Status: DC
  Administered 2012-04-22 – 2012-04-24 (×9): via INTRAVENOUS
  Filled 2012-04-22 (×13): qty 1000

## 2012-04-22 MED ORDER — HYDROMORPHONE HCL PF 1 MG/ML IJ SOLN
1.0000 mg | INTRAMUSCULAR | Status: DC | PRN
  Administered 2012-04-22: 1 mg via INTRAVENOUS
  Filled 2012-04-22 (×2): qty 1

## 2012-04-22 MED ORDER — ENOXAPARIN SODIUM 40 MG/0.4ML ~~LOC~~ SOLN
40.0000 mg | SUBCUTANEOUS | Status: DC
  Administered 2012-04-22 – 2012-04-24 (×3): 40 mg via SUBCUTANEOUS
  Filled 2012-04-22 (×4): qty 0.4

## 2012-04-22 MED ORDER — ONDANSETRON HCL 4 MG/2ML IJ SOLN: 4.0000 mg | Freq: Four times a day (QID) | INTRAMUSCULAR | Status: DC | PRN

## 2012-04-22 NOTE — Progress Notes (Signed)
Patient ID: Alan Riley, male   DOB: 1945-10-06, 67 y.o.   MRN: [id number]    Subjective: Feels ok, still with some abd pain, denies n/v, not passing flatus or having BMs,  Objective: Vital signs in last 24 hours: Temp:  [97.9 F (36.6 C)-98.6 F (37 C)] 98 F (36.7 C) (03/30 0552) Pulse Rate:  [60-69] 60 (03/30 0552) Resp:  [11-28] 17 (03/30 0552) BP: (115-168)/(58-82) 121/60 mmHg (03/30 0552) SpO2:  [94 %-100 %] 99 % (03/30 0552) Weight:  [179 lb 11.2 oz (81.511 kg)] 179 lb 11.2 oz (81.511 kg) (03/30 0217) Last BM Date: 04/20/12  Intake/Output from previous day: 03/29 0701 - 03/30 0700 In: 283.3 [I.V.:283.3] Out: 300 [Emesis/NG output:300] Intake/Output this shift:    PE: Abd: soft, mildly tender, very faint bowel sounds General: NGT in place, 300cc/24hrs, looks like diluted bilious output.  Lab Results:   Recent Labs  04/21/12 1759 04/22/12 0550  WBC 13.9* 11.8*  HGB 15.1 14.3  HCT 43.1 41.4  PLT 162 162   BMET  Recent Labs  04/21/12 1759 04/22/12 0550  NA 139 140  K 4.1 3.8  CL 101 103  CO2 28 27  GLUCOSE 129* 122*  BUN 10 9  CREATININE 0.84 0.79  CALCIUM 9.5 8.9   PT/INR No results found for this basename: LABPROT, INR,  in the last 72 hours CMP     Component Value Date/Time   NA 140 04/22/2012 0550   K 3.8 04/22/2012 0550   CL 103 04/22/2012 0550   CO2 27 04/22/2012 0550   GLUCOSE 122* 04/22/2012 0550   BUN 9 04/22/2012 0550   CREATININE 0.79 04/22/2012 0550   CREATININE 0.76 12/09/2011 1011   CALCIUM 8.9 04/22/2012 0550   PROT 7.3 04/21/2012 1759   ALBUMIN 4.2 04/21/2012 1759   AST 20 04/21/2012 1759   ALT 20 04/21/2012 1759   ALKPHOS 59 04/21/2012 1759   BILITOT 0.5 04/21/2012 1759   GFRNONAA >90 04/22/2012 0550   GFRAA >90 04/22/2012 0550   Lipase     Component Value Date/Time   LIPASE 45 04/21/2012 1759       Studies/Results: Ct Abdomen Pelvis W Contrast  04/21/2012  *RADIOLOGY REPORT*  Clinical Data: Abdominal pain since this morning  progressively worse, history of colon carcinoma of ascending colon  CT ABDOMEN AND PELVIS WITH CONTRAST  Technique:  Multidetector CT imaging of the abdomen and pelvis was performed following the standard protocol during bolus administration of intravenous contrast.  Sagittal and coronal MPR images reconstructed from axial data set.  Contrast: OMNIPAQUE IOHEXOL 300 MG/ML  SOLN Dilute oral contrast.  Comparison: 12/25/2009  Findings: Bibasilar atelectasis. Gallbladder surgically absent. Liver, spleen, pancreas, kidneys, and adrenal glands normal appearance. Chronic infiltration of mesenteric fat in upper abdomen due to fibrosing mesenteritis. Prior right ileocolic resection.  Dilated proximal small bowel loops with decompressed distal small bowel loops consistent with mid small bowel obstruction. Area of transition is in the mid abdomen near midline image 57, where minimal wall thickening and enhancement of the small bowel wall is identified. This could represent an inflammatory or neoplastic process or wall thickening at an adhesion. Distal small bowel loops and colon decompressed.  Small amount of nonspecific low attenuation free pelvic fluid. Dilated internal inguinal rings and proximal inguinal canals consistent with fat containing inguinal hernias. Stomach unremarkable. No definite mass, adenopathy, or free air. Bones demineralized.  IMPRESSION: Mid small bowel obstruction with transition zone in the anterior mid abdomen where enhancement of  wall thickening of a small bowel loop are seen, differential diagnosis including adhesion with wall thickening, neoplasm, or inflammatory process. No evidence of perforation or adenopathy. 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 Small bilateral inguinal hernias containing fat.   Original Report Authenticated By: Ulyses Southward, M.D.    Dg Abd Acute W/chest  04/21/2012  *RADIOLOGY REPORT*  Clinical Data: Abdominal pain.  ACUTE ABDOMEN  SERIES (ABDOMEN 2 VIEW & CHEST 1 VIEW)  Comparison: Previous examinations.  Findings: Poor inspiration.  No gross change in borderline enlargement of the cardiac silhouette.  Minimal atelectasis at the right lung base and left mid lung zone.  Multiple mildly dilated small bowel loops with multiple air-fluid levels.  No free peritoneal air.  Thoracic and lumbar spine degenerative changes.  IMPRESSION:  1.  Mild partial small bowel obstruction or ileus. 2.  Minimal bilateral atelectasis.   Original Report Authenticated By: Beckie Salts, M.D.    Dg Abd Portable 2v  04/22/2012  *RADIOLOGY REPORT*  Clinical Data: Rule out bowel obstruction.  PORTABLE ABDOMEN - 2 VIEW  Comparison: Abdominal CT 04/21/2012  Findings: Left lateral decubitus image and supine image of the abdomen was obtained.  There is no evidence for free air.  There is a nasogastric in the stomach region. There is a dilated gas-filled loop of small bowel involving the upper abdomen.  There is contrast within the urinary bladder.  IMPRESSION: Dilated gas-filled loop of small bowel.  Findings remain compatible with a small bowel obstruction.  Placement of nasogastric tube.   Original Report Authenticated By: Richarda Overlie, M.D.     Anti-infectives: Anti-infectives   None       Assessment/Plan 1. SBO: seems better clinically today, films still show persistent SBO, will keep NGT in place and keep NPO, OOB and ambulate, repeat films in AM.   LOS: 1 day    Alan Riley 04/22/2012

## 2012-04-22 NOTE — Progress Notes (Signed)
Utilization Review Completed.Alan Riley T3/30/2014  

## 2012-04-22 NOTE — Progress Notes (Signed)
Patient seen and examined.  Agree with PA's note.  

## 2012-04-22 NOTE — H&P (Signed)
Valentin Benney is an 67 y.o. male.   Chief Complaint: Abdominal pain, nausea and vomiting HPI: This is a 67 year old gentleman who started having abdominal pain which he described as crampy earlier today. He had nausea and one episode of vomiting. Since coming to the emergency room, he has had an NG tube placed and now feels much better. He is now pain free. Last bowel movement was yesterday.  He had a previous small bowel obstruction in 2011 that resolved without need of NG. He has had a previous right hemicolectomy for a stage III adenocarcinoma in 2004. He sees his oncologist regularly And was due to see him on Monday.  Colonoscopy last year was negative.  Past Medical History  Diagnosis Date  . Malignant neoplasm of ascending colon   . Iron deficiency anemia secondary to blood loss (chronic)   . Hx of adenomatous colonic polyps     Past Surgical History  Procedure Laterality Date  . Hemicolectomy  01/15/03  . Cholecystectomy  1991    Family History  Problem Relation Age of Onset  . Diabetes Mother   . Diabetes Brother    Social History:  reports that he has never smoked. He does not have any smokeless tobacco history on file. He reports that  drinks alcohol. His drug history is not on file.  Allergies: No Known Allergies   (Not in a hospital admission)  Results for orders placed during the hospital encounter of 04/21/12 (from the past 48 hour(s))  CBC WITH DIFFERENTIAL     Status: Abnormal   Collection Time    04/21/12  5:59 PM      Result Value Range   WBC 13.9 (*) 4.0 - 10.5 K/uL   RBC 4.78  4.22 - 5.81 MIL/uL   Hemoglobin 15.1  13.0 - 17.0 g/dL   HCT 16.1  09.6 - 04.5 %   MCV 90.2  78.0 - 100.0 fL   MCH 31.6  26.0 - 34.0 pg   MCHC 35.0  30.0 - 36.0 g/dL   RDW 40.9  81.1 - 91.4 %   Platelets 162  150 - 400 K/uL   Neutrophils Relative 88 (*) 43 - 77 %   Neutro Abs 12.2 (*) 1.7 - 7.7 K/uL   Lymphocytes Relative 6 (*) 12 - 46 %   Lymphs Abs 0.8  0.7 - 4.0 K/uL   Monocytes Relative 6  3 - 12 %   Monocytes Absolute 0.9  0.1 - 1.0 K/uL  COMPREHENSIVE METABOLIC PANEL     Status: Abnormal   Collection Time    04/21/12  5:59 PM      Result Value Range   Sodium 139  135 - 145 mEq/L   Potassium 4.1  3.5 - 5.1 mEq/L   Chloride 101  96 - 112 mEq/L   CO2 28  19 - 32 mEq/L   Glucose, Bld 129 (*) 70 - 99 mg/dL   BUN 10  6 - 23 mg/dL   Creatinine, Ser 7.82  0.50 - 1.35 mg/dL   Calcium 9.5  8.4 - 95.6 mg/dL   Total Protein 7.3  6.0 - 8.3 g/dL   Albumin 4.2  3.5 - 5.2 g/dL   AST 20  0 - 37 U/L   ALT 20  0 - 53 U/L   Alkaline Phosphatase 59  39 - 117 U/L   Total Bilirubin 0.5  0.3 - 1.2 mg/dL   GFR calc non Af Amer 90 (*) >90 mL/min   GFR  calc Af Amer >90  >90 mL/min   Comment:            The eGFR has been calculated     using the CKD EPI equation.     This calculation has not been     validated in all clinical     situations.     eGFR's persistently     <90 mL/min signify     possible Chronic Kidney Disease.  LIPASE, BLOOD     Status: None   Collection Time    04/21/12  5:59 PM      Result Value Range   Lipase 45  11 - 59 U/L  TROPONIN I     Status: None   Collection Time    04/21/12  6:00 PM      Result Value Range   Troponin I <0.30  <0.30 ng/mL   Comment:            Due to the release kinetics of cTnI,     a negative result within the first hours     of the onset of symptoms does not rule out     myocardial infarction with certainty.     If myocardial infarction is still suspected,     repeat the test at appropriate intervals.  LACTIC ACID, PLASMA     Status: None   Collection Time    04/21/12  6:04 PM      Result Value Range   Lactic Acid, Venous 1.3  0.5 - 2.2 mmol/L   Ct Abdomen Pelvis W Contrast  04/21/2012  *RADIOLOGY REPORT*  Clinical Data: Abdominal pain since this morning progressively worse, history of colon carcinoma of ascending colon  CT ABDOMEN AND PELVIS WITH CONTRAST  Technique:  Multidetector CT imaging of the  abdomen and pelvis was performed following the standard protocol during bolus administration of intravenous contrast.  Sagittal and coronal MPR images reconstructed from axial data set.  Contrast: OMNIPAQUE IOHEXOL 300 MG/ML  SOLN Dilute oral contrast.  Comparison: 12/25/2009  Findings: Bibasilar atelectasis. Gallbladder surgically absent. Liver, spleen, pancreas, kidneys, and adrenal glands normal appearance. Chronic infiltration of mesenteric fat in upper abdomen due to fibrosing mesenteritis. Prior right ileocolic resection.  Dilated proximal small bowel loops with decompressed distal small bowel loops consistent with mid small bowel obstruction. Area of transition is in the mid abdomen near midline image 57, where minimal wall thickening and enhancement of the small bowel wall is identified. This could represent an inflammatory or neoplastic process or wall thickening at an adhesion. Distal small bowel loops and colon decompressed.  Small amount of nonspecific low attenuation free pelvic fluid. Dilated internal inguinal rings and proximal inguinal canals consistent with fat containing inguinal hernias. Stomach unremarkable. No definite mass, adenopathy, or free air. Bones demineralized.  IMPRESSION: Mid small bowel obstruction with transition zone in the anterior mid abdomen where enhancement of wall thickening of a small bowel loop are seen, differential diagnosis including adhesion with wall thickening, neoplasm, or inflammatory process. No evidence of perforation or adenopathy. 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 Small bilateral inguinal hernias containing fat.   Original Report Authenticated By: Ulyses Southward, M.D.    Dg Abd Acute W/chest  04/21/2012  *RADIOLOGY REPORT*  Clinical Data: Abdominal pain.  ACUTE ABDOMEN SERIES (ABDOMEN 2 VIEW & CHEST 1 VIEW)  Comparison: Previous examinations.  Findings: Poor inspiration.  No gross change in borderline enlargement of the  cardiac silhouette.  Minimal atelectasis at the right  lung base and left mid lung zone.  Multiple mildly dilated small bowel loops with multiple air-fluid levels.  No free peritoneal air.  Thoracic and lumbar spine degenerative changes.  IMPRESSION:  1.  Mild partial small bowel obstruction or ileus. 2.  Minimal bilateral atelectasis.   Original Report Authenticated By: Beckie Salts, M.D.     Review of Systems  All other systems reviewed and are negative.    Blood pressure 131/62, pulse 66, temperature 97.9 F (36.6 C), temperature source Oral, resp. rate 21, SpO2 97.00%. Physical Exam  Constitutional: He is oriented to person, place, and time. He appears well-developed and well-nourished. No distress.  HENT:  Head: Normocephalic and atraumatic.  Right Ear: External ear normal.  Left Ear: External ear normal.  Nose: Nose normal.  Mouth/Throat: Oropharynx is clear and moist. No oropharyngeal exudate.  Eyes: Conjunctivae are normal. Pupils are equal, round, and reactive to light. Right eye exhibits no discharge. Left eye exhibits no discharge. No scleral icterus.  Neck: Normal range of motion. Neck supple. No tracheal deviation present. No thyromegaly present.  Cardiovascular: Normal rate, regular rhythm, normal heart sounds and intact distal pulses.   No murmur heard. Respiratory: Effort normal and breath sounds normal. No respiratory distress. He has no wheezes.  GI: Soft. Bowel sounds are normal. He exhibits no distension and no mass. There is no tenderness. There is no rebound and no guarding.  Musculoskeletal: Normal range of motion. He exhibits no edema and no tenderness.  Lymphadenopathy:    He has no cervical adenopathy.  Neurological: He is alert and oriented to person, place, and time.  Skin: Skin is warm and dry. No rash noted. He is not diaphoretic. No erythema.  Psychiatric: His behavior is normal. Judgment normal.     Assessment/Plan Partial small bowel obstruction  He  will be admitted to the hospital for bowel rest, nasogastric suctioning, and IV rehydration. I will repeat his abdominal x-rays in the morning.  I will also check a CEA. Hopefully, this will resolve with conservative measures. I discussed this with the patient and his daughter in detail.  Libertie Hausler A 04/22/2012, 12:22 AM

## 2012-04-23 ENCOUNTER — Other Ambulatory Visit: Payer: BC Managed Care – PPO | Admitting: Lab

## 2012-04-23 ENCOUNTER — Ambulatory Visit: Payer: BC Managed Care – PPO | Admitting: Physician Assistant

## 2012-04-23 ENCOUNTER — Inpatient Hospital Stay (HOSPITAL_COMMUNITY): Payer: PRIVATE HEALTH INSURANCE

## 2012-04-23 IMAGING — CR DG ABD PORTABLE 1V
1 series · 1 of 1 positions shown · non-contrast
Comparison: [DATE]

CLINICAL DATA: Small obstruction.  Check NG tube position.

PORTABLE ABDOMEN - 1 VIEW

[AP]
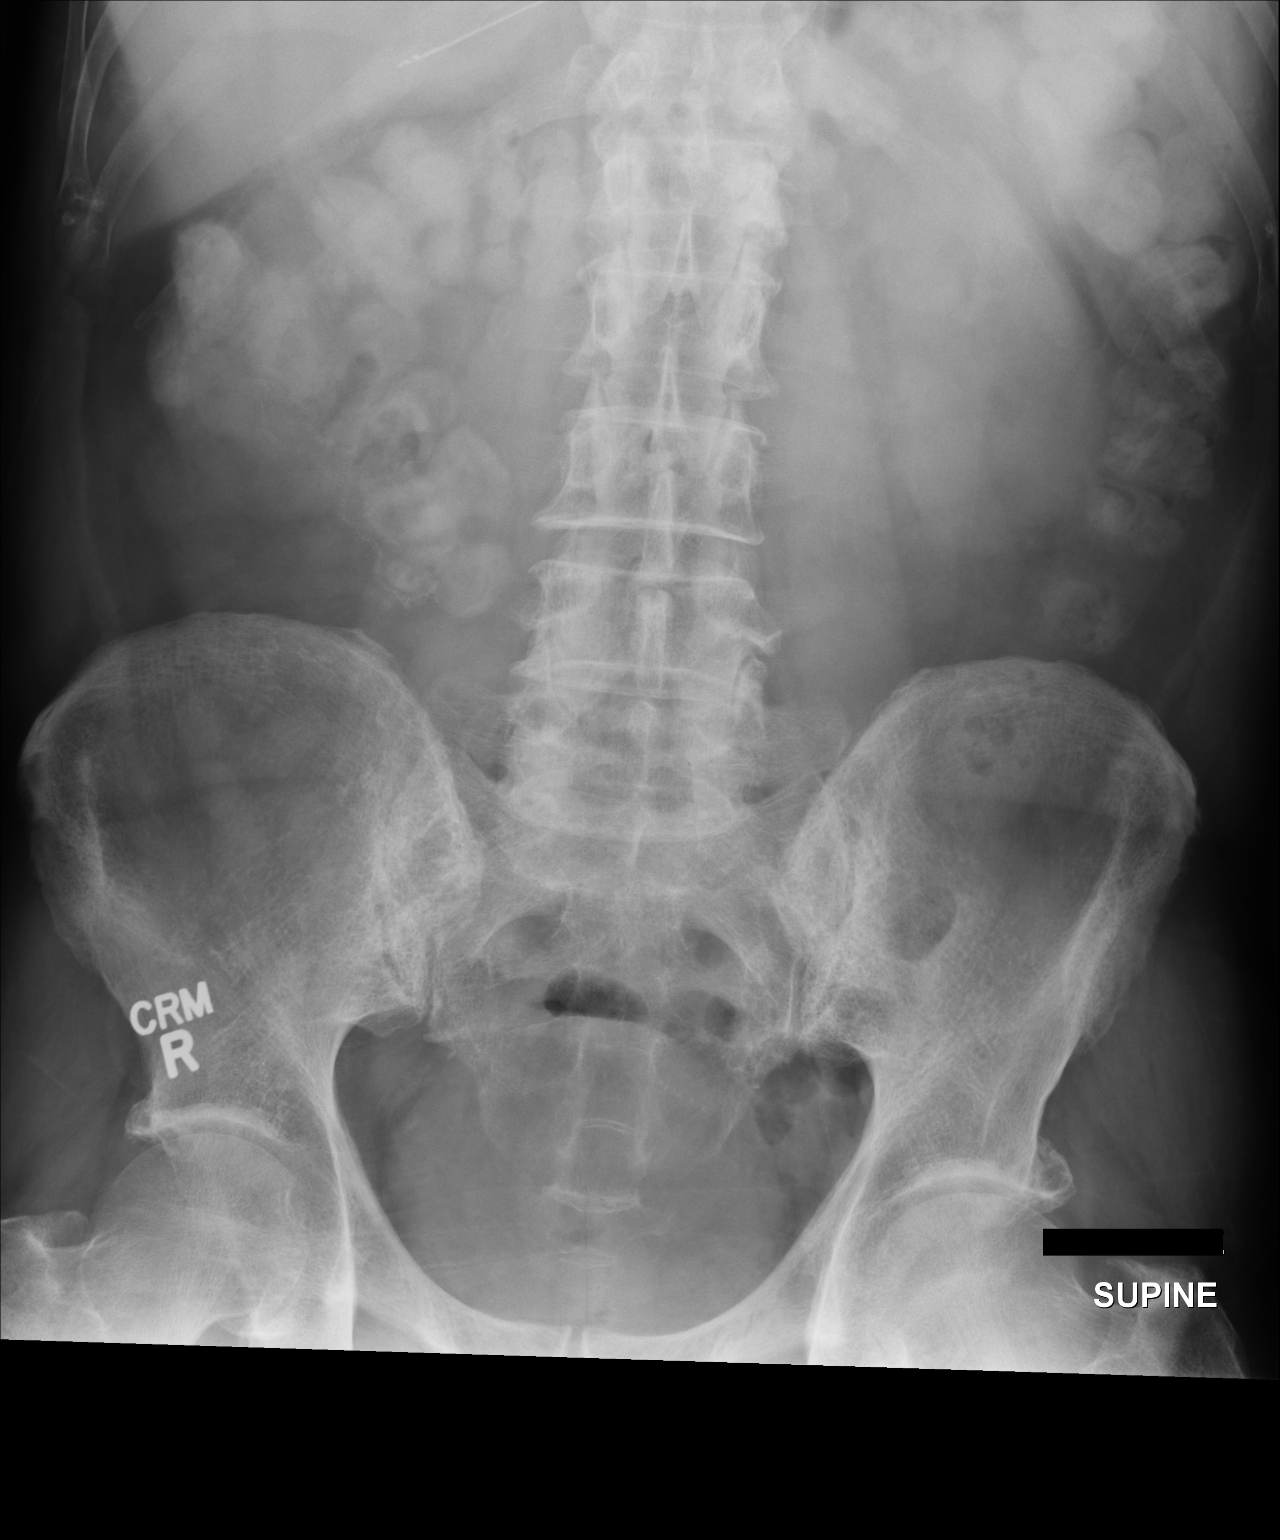

[1 of 1 positions shown; findings below may reference images not displayed]

FINDINGS: NG tube tip is visualized just to the upper field of view
in the right upper quadrant consistent with location in the distal
stomach and similar to previous study.  There is interval
improvement of the appearance of the small bowel.  No residual gas
filled small bowel loops are present.  Stool filled colon without
distension.  Degenerative changes in the spine and hips.
IMPRESSION: Enteric tube tip is unchanged in position.  Bowel gas pattern is
normal with interval decompression of the small bowel.

## 2012-04-23 MED ORDER — MAGIC MOUTHWASH: 15.0000 mL | Freq: Four times a day (QID) | ORAL | Status: DC | PRN

## 2012-04-23 MED ORDER — MENTHOL 3 MG MT LOZG: 1.0000 | LOZENGE | OROMUCOSAL | Status: DC | PRN

## 2012-04-23 MED ORDER — BLISTEX EX OINT
TOPICAL_OINTMENT | Freq: Two times a day (BID) | CUTANEOUS | Status: DC
  Administered 2012-04-23 – 2012-04-25 (×5): via TOPICAL
  Filled 2012-04-23: qty 10

## 2012-04-23 MED ORDER — LIP MEDEX EX OINT: 1.0000 "application " | TOPICAL_OINTMENT | Freq: Two times a day (BID) | CUTANEOUS | Status: DC

## 2012-04-23 MED ORDER — ACETAMINOPHEN 650 MG RE SUPP: 650.0000 mg | Freq: Four times a day (QID) | RECTAL | Status: DC | PRN

## 2012-04-23 MED ORDER — PHENOL 1.4 % MT LIQD: 2.0000 | OROMUCOSAL | Status: DC | PRN

## 2012-04-23 MED ORDER — BISACODYL 10 MG RE SUPP: 10.0000 mg | Freq: Two times a day (BID) | RECTAL | Status: DC | PRN

## 2012-04-23 MED ORDER — ALUM & MAG HYDROXIDE-SIMETH 200-200-20 MG/5ML PO SUSP: 30.0000 mL | Freq: Four times a day (QID) | ORAL | Status: DC | PRN

## 2012-04-23 MED ORDER — DIPHENHYDRAMINE HCL 50 MG/ML IJ SOLN: 12.5000 mg | Freq: Four times a day (QID) | INTRAMUSCULAR | Status: DC | PRN

## 2012-04-23 MED ORDER — PROMETHAZINE HCL 25 MG/ML IJ SOLN: 12.5000 mg | Freq: Four times a day (QID) | INTRAMUSCULAR | Status: DC | PRN

## 2012-04-23 NOTE — Progress Notes (Signed)
Xrays much better with contrast in colon Try Enema Consider NGT clamping trial

## 2012-04-23 NOTE — Progress Notes (Signed)
Subjective: Pt c/o pain in upper abdomen, no nausea or vomiting.  Passing very little gas, no BM's yet.  Pt not ambulating much.  Pt speaks [place]ian and some english.    Objective: Vital signs in last 24 hours: Temp:  [98 F (36.7 C)-98.6 F (37 C)] 98.3 F (36.8 C) (03/31 0610) Pulse Rate:  [52-63] 63 (03/31 0610) Resp:  [18] 18 (03/31 0610) BP: (116-136)/(54-66) 136/62 mmHg (03/31 0610) SpO2:  [95 %-98 %] 98 % (03/31 0610) Last BM Date: 04/20/12  Intake/Output from previous day: 03/30 0701 - 03/31 0700 In: 3037.5 [I.V.:3037.5] Out: 950 [Urine:150; Emesis/NG output:800] Intake/Output this shift:    PE: Gen:  Alert, NAD, pleasant Abd: Soft, mildly tender in the upper abdomen, mildly distended, diminished BS, no HSM, NG in place with out last 24 hours   Lab Results:   Recent Labs  04/21/12 1759 04/22/12 0550  WBC 13.9* 11.8*  HGB 15.1 14.3  HCT 43.1 41.4  PLT 162 162   BMET  Recent Labs  04/21/12 1759 04/22/12 0550  NA 139 140  K 4.1 3.8  CL 101 103  CO2 28 27  GLUCOSE 129* 122*  BUN 10 9  CREATININE 0.84 0.79  CALCIUM 9.5 8.9   PT/INR No results found for this basename: LABPROT, INR,  in the last 72 hours CMP     Component Value Date/Time   NA 140 04/22/2012 0550   K 3.8 04/22/2012 0550   CL 103 04/22/2012 0550   CO2 27 04/22/2012 0550   GLUCOSE 122* 04/22/2012 0550   BUN 9 04/22/2012 0550   CREATININE 0.79 04/22/2012 0550   CREATININE 0.76 12/09/2011 1011   CALCIUM 8.9 04/22/2012 0550   PROT 7.3 04/21/2012 1759   ALBUMIN 4.2 04/21/2012 1759   AST 20 04/21/2012 1759   ALT 20 04/21/2012 1759   ALKPHOS 59 04/21/2012 1759   BILITOT 0.5 04/21/2012 1759   GFRNONAA >90 04/22/2012 0550   GFRAA >90 04/22/2012 0550   Lipase     Component Value Date/Time   LIPASE 45 04/21/2012 1759       Studies/Results: Ct Abdomen Pelvis W Contrast  04/21/2012  *RADIOLOGY REPORT*  Clinical Data: Abdominal pain since this morning progressively worse, history  of colon carcinoma of ascending colon  CT ABDOMEN AND PELVIS WITH CONTRAST  Technique:  Multidetector CT imaging of the abdomen and pelvis was performed following the standard protocol during bolus administration of intravenous contrast.  Sagittal and coronal MPR images reconstructed from axial data set.  Contrast: OMNIPAQUE IOHEXOL 300 MG/ML  SOLN Dilute oral contrast.  Comparison: 12/25/2009  Findings: Bibasilar atelectasis. Gallbladder surgically absent. Liver, spleen, pancreas, kidneys, and adrenal glands normal appearance. Chronic infiltration of mesenteric fat in upper abdomen due to fibrosing mesenteritis. Prior right ileocolic resection.  Dilated proximal small bowel loops with decompressed distal small bowel loops consistent with mid small bowel obstruction. Area of transition is in the mid abdomen near midline image 57, where minimal wall thickening and enhancement of the small bowel wall is identified. This could represent an inflammatory or neoplastic process or wall thickening at an adhesion. Distal small bowel loops and colon decompressed.  Small amount of nonspecific low attenuation free pelvic fluid. Dilated internal inguinal rings and proximal inguinal canals consistent with fat containing inguinal hernias. Stomach unremarkable. No definite mass, adenopathy, or free air. Bones demineralized.  IMPRESSION: Mid small bowel obstruction with transition zone in the anterior mid abdomen where enhancement of wall thickening of a small  bowel loop are seen, differential diagnosis including adhesion with wall thickening, neoplasm, or inflammatory process. No evidence of perforation or adenopathy. 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 Small bilateral inguinal hernias containing fat.   Original Report Authenticated By: Ulyses Southward, M.D.    Dg Abd Acute W/chest  04/21/2012  *RADIOLOGY REPORT*  Clinical Data: Abdominal pain.  ACUTE ABDOMEN SERIES (ABDOMEN 2 VIEW & CHEST 1  VIEW)  Comparison: Previous examinations.  Findings: Poor inspiration.  No gross change in borderline enlargement of the cardiac silhouette.  Minimal atelectasis at the right lung base and left mid lung zone.  Multiple mildly dilated small bowel loops with multiple air-fluid levels.  No free peritoneal air.  Thoracic and lumbar spine degenerative changes.  IMPRESSION:  1.  Mild partial small bowel obstruction or ileus. 2.  Minimal bilateral atelectasis.   Original Report Authenticated By: Beckie Salts, M.D.    Dg Abd Portable 1v  04/23/2012  *RADIOLOGY REPORT*  Clinical Data: Small obstruction.  Check NG tube position.  PORTABLE ABDOMEN - 1 VIEW  Comparison: 04/22/2012  Findings: NG tube tip is visualized just to the upper field of view in the right upper quadrant consistent with location in the distal stomach and similar to previous study.  There is interval improvement of the appearance of the small bowel.  No residual gas filled small bowel loops are present.  Stool filled colon without distension.  Degenerative changes in the spine and hips.  IMPRESSION: Enteric tube tip is unchanged in position.  Bowel gas pattern is normal with interval decompression of the small bowel.   Original Report Authenticated By: Burman Nieves, M.D.    Dg Abd Portable 2v  04/22/2012  *RADIOLOGY REPORT*  Clinical Data: Rule out bowel obstruction.  PORTABLE ABDOMEN - 2 VIEW  Comparison: Abdominal CT 04/21/2012  Findings: Left lateral decubitus image and supine image of the abdomen was obtained.  There is no evidence for free air.  There is a nasogastric in the stomach region. There is a dilated gas-filled loop of small bowel involving the upper abdomen.  There is contrast within the urinary bladder.  IMPRESSION: Dilated gas-filled loop of small bowel.  Findings remain compatible with a small bowel obstruction.  Placement of nasogastric tube.   Original Report Authenticated By: Richarda Overlie, M.D.      Anti-infectives: Anti-infectives   None       Assessment/Plan Recurrent SBO (last SBO in 2011), Adenocarcinoma colon cancer in 2004 (Dr. Carolynne Edouard) - some improvements on KUB, but not clinically yet 1.  Continue conservative treatments: IVF, pain control, antiemetics, NG tube, NPO 2.  Ambulate and IS, lovenox, SCD's 3.  Await bowel function return, if not in the next few days, will likely need ex lap  Discussed care with daughter Radana via phone, she understands treatment plan    LOS: 2 days    Aris Georgia 04/23/2012, 9:13 AM Pager: [phone]

## 2012-04-24 LAB — BASIC METABOLIC PANEL
BUN: 11 mg/dL (ref 6–23)
CO2: 22 mEq/L (ref 19–32)
Chloride: 102 mEq/L (ref 96–112)
GFR calc Af Amer: >90 mL/min (ref >90)
Potassium: 3.8 mEq/L (ref 3.5–5.1)

## 2012-04-24 LAB — CBC
HCT: 40.9 % (ref 39.0–52.0)
Platelets: 144 10*3/uL — ABNORMAL LOW (ref 150–400)
RBC: 4.51 MIL/uL (ref 4.22–5.81)
RDW: 12.3 % (ref 11.5–15.5)
WBC: 7.5 10*3/uL (ref 4.0–10.5)

## 2012-04-24 MED ORDER — SACCHAROMYCES BOULARDII 250 MG PO CAPS
250.0000 mg | ORAL_CAPSULE | Freq: Two times a day (BID) | ORAL | Status: DC
  Administered 2012-04-24 – 2012-04-25 (×3): 250 mg via ORAL
  Filled 2012-04-24 (×4): qty 1

## 2012-04-24 MED ORDER — ACETAMINOPHEN 325 MG PO TABS: 325.0000 mg | ORAL_TABLET | Freq: Four times a day (QID) | ORAL | Status: DC | PRN

## 2012-04-24 NOTE — Progress Notes (Addendum)
Feeling better Walking +flatus  +Bm abd min distended  -agree w PO trial -wean IVF D/C patient from hospital when patient meets criteria (prob 1-2days):  Tolerating oral intake well Ambulating in walkways Adequate pain control without IV medications Urinating  Having flatus

## 2012-04-24 NOTE — Progress Notes (Signed)
Patient has not c/o nausea or abd pain. 0125 NGT residual and 0530 NGT residual 0.

## 2012-04-24 NOTE — Progress Notes (Signed)
  Subjective: Pt feeling well this morning.  No pain or nausea.  Residues were low after clamping trial.  Pt ambulating and using IS.  1 BM yesterday.  Hungry.  Objective: Vital signs in last 24 hours: Temp:  [97.3 F (36.3 C)-97.8 F (36.6 C)] 97.3 F (36.3 C) (04/01 0534) Pulse Rate:  [60-61] 61 (04/01 0534) Resp:  [18] 18 (04/01 0534) BP: (115-137)/(51-65) 123/51 mmHg (04/01 0534) SpO2:  [97 %-98 %] 97 % (04/01 0534) Last BM Date: 05/21/12  Intake/Output from previous day: 03/31 0701 - 04/01 0700 In: 2943.8 [I.V.:2943.8] Out: 500 [Emesis/NG output:500] Intake/Output this shift:    PE: Gen:  Alert, NAD, pleasant Card:  RRR, no M/G/R heard Pulm:  CTA, no W/R/R Abd: Soft, NT/ND, +BS, no HSM   Lab Results:   Recent Labs  04/21/12 1759 04/22/12 0550  WBC 13.9* 11.8*  HGB 15.1 14.3  HCT 43.1 41.4  PLT 162 162   BMET  Recent Labs  04/21/12 1759 04/22/12 0550  NA 139 140  K 4.1 3.8  CL 101 103  CO2 28 27  GLUCOSE 129* 122*  BUN 10 9  CREATININE 0.84 0.79  CALCIUM 9.5 8.9   PT/INR No results found for this basename: LABPROT, INR,  in the last 72 hours CMP     Component Value Date/Time   NA 140 04/22/2012 0550   K 3.8 04/22/2012 0550   CL 103 04/22/2012 0550   CO2 27 04/22/2012 0550   GLUCOSE 122* 04/22/2012 0550   BUN 9 04/22/2012 0550   CREATININE 0.79 04/22/2012 0550   CREATININE 0.76 12/09/2011 1011   CALCIUM 8.9 04/22/2012 0550   PROT 7.3 04/21/2012 1759   ALBUMIN 4.2 04/21/2012 1759   AST 20 04/21/2012 1759   ALT 20 04/21/2012 1759   ALKPHOS 59 04/21/2012 1759   BILITOT 0.5 04/21/2012 1759   GFRNONAA >90 04/22/2012 0550   GFRAA >90 04/22/2012 0550   Lipase     Component Value Date/Time   LIPASE 45 04/21/2012 1759       Studies/Results: Dg Abd Portable 1v  04/23/2012  *RADIOLOGY REPORT*  Clinical Data: Small obstruction.  Check NG tube position.  PORTABLE ABDOMEN - 1 VIEW  Comparison: 04/22/2012  Findings: NG tube tip is visualized just to the  upper field of view in the right upper quadrant consistent with location in the distal stomach and similar to previous study.  There is interval improvement of the appearance of the small bowel.  No residual gas filled small bowel loops are present.  Stool filled colon without distension.  Degenerative changes in the spine and hips.  IMPRESSION: Enteric tube tip is unchanged in position.  Bowel gas pattern is normal with interval decompression of the small bowel.   Original Report Authenticated By: Burman Nieves, M.D.     Anti-infectives: Anti-infectives   None       Assessment/Plan Recurrent SBO (last SBO in 2011), Adenocarcinoma colon cancer in 2004 (Dr. Carolynne Edouard) - some improvements on KUB, but not clinically yet  1. NG was clamped yesterday without nausea or abdominal pain, d/c NG tube and start clears 2. Ambulate and IS, lovenox, SCD's  3. Advance diet as tolerated 4.  Will likely be able to go home tomorrow if continues to do well  Discussed care with daughter Alan Riley via phone, she understands treatment plan     LOS: 3 days    DORT, [facility] 04/24/2012, 8:00 AM Pager: [phone]

## 2012-04-25 DIAGNOSIS — K56609 Unspecified intestinal obstruction, unspecified as to partial versus complete obstruction: Secondary | ICD-10-CM | POA: Diagnosis present

## 2012-04-25 MED ORDER — ACETAMINOPHEN 325 MG PO TABS: 650.0000 mg | ORAL_TABLET | Freq: Four times a day (QID) | ORAL | Status: AC | PRN

## 2012-04-25 NOTE — Progress Notes (Signed)
  Subjective: 67 year old pleasant male with a PMH of adenocarcinoma of colon in 2004(Dr. Toth), SBO 2011, admitted for recurrent SBO.  NG tube was  discontinued yesterday.  He is tolerating regular diet quite well.  BM x2 yesterday, + flatus.  He denies abdominal pain, n/v.  He is ambulating without any difficulties.  Objective: Vital signs in last 24 hours: Temp:  [97.7 F (36.5 C)-98.6 F (37 C)] 97.8 F (36.6 C) (04/02 0542) Pulse Rate:  [54-92] 54 (04/02 0542) Resp:  [17-18] 18 (04/02 0542) BP: (114-143)/(60-67) 130/61 mmHg (04/02 0542) SpO2:  [96 %-98 %] 96 % (04/02 0542) Last BM Date: 04/23/12  Intake/Output from previous day: 04/01 0701 - 04/02 0700 In: 4594.2 [P.O.:2780; I.V.:1814.2] Out: 3 [Urine:3] Intake/Output this shift:    PE: Gen:  Alert, NAD, pleasant Card:  RRR, no M/G/R heard Pulm:  CTA, no W/R/R Abd: Soft, NT/ND, +BS, no HSM, Ext:  No erythema, edema, or tenderness   Lab Results:   Recent Labs  04/24/12 0916  WBC 7.5  HGB 14.6  HCT 40.9  PLT 144*   BMET  Recent Labs  04/24/12 0916  NA 139  K 3.8  CL 102  CO2 22  GLUCOSE 108*  BUN 11  CREATININE 0.64  CALCIUM 8.6   CMP     Component Value Date/Time   NA 139 04/24/2012 0916   K 3.8 04/24/2012 0916   CL 102 04/24/2012 0916   CO2 22 04/24/2012 0916   GLUCOSE 108* 04/24/2012 0916   BUN 11 04/24/2012 0916   CREATININE 0.64 04/24/2012 0916   CREATININE 0.76 12/09/2011 1011   CALCIUM 8.6 04/24/2012 0916   PROT 7.3 04/21/2012 1759   ALBUMIN 4.2 04/21/2012 1759   AST 20 04/21/2012 1759   ALT 20 04/21/2012 1759   ALKPHOS 59 04/21/2012 1759   BILITOT 0.5 04/21/2012 1759   GFRNONAA >90 04/24/2012 0916   GFRAA >90 04/24/2012 0916   Lipase     Component Value Date/Time   LIPASE 45 04/21/2012 1759       Studies/Results: No results found.  Anti-infectives: Anti-infectives   None       Assessment/Plan  Recurrent SBO (last SBO in 2011), Adenocarcinoma colon in 2004 (Dr. Carolynne Edouard) - tolerating regular  diet, up ad lib and no further abdominal pain.  Has not required any pain medication.  We discussed high fiber diet, adequate oral hydration.   Plan for discharge today.  Offered to contact his daughter, pt. States his spouse, Alan Riley, will provide transportation.   LOS: 4 days    Alan Riley 04/25/2012, 8:15 AM  [phone]

## 2012-04-25 NOTE — Progress Notes (Signed)
Patient discharged to home with instructions given to wife 

## 2012-04-25 NOTE — Discharge Summary (Signed)
Patient ID: Alan Riley MRN: [id number] DOB/AGE: Jul 20, 1945 67 y.o.  Admit date: 04/21/2012 Discharge date: 04/25/2012  Procedures: none  Consults:   none  Reason for Admission:  Upper abdominal pain, nausea and vomiting.  Admission Diagnoses: Small Bowel Obstruction  Hospital Course: This is a pleasant 67 year old [place] male with a PMH of colon cancer in 2004(Dr. Carolynne Edouard), SBO in 2011 who presented to the ED with abdominal pain, nausea and vomiting.  CT of abdomen and abdomen series noted mild small bowel obstruction.  NG tube was placed and pt. Kept NPO.  His pain was treated with IV narcotics, although he did not require additional doses.Marland Kitchen   He continued to improve with conservative management.  NG tube discontinued on 04/24/12.   His diet was advanced as tolerated. Bowel movement x2 yesterday.  Ambulating without any difficulties.  Voiding without any difficulties.  Hemodynamically stable. Stable for discharge.  Instructions provided to the patient, verbalized understanding. He may take tylenol prn for pain. He may follow up with Dr. Carolynne Edouard as needed.  Discharge Diagnoses:  Principal Problem:   Small bowel obstruction   Discharge Medications:   Medication List    TAKE these medications       acetaminophen 325 MG tablet  Commonly known as:  TYLENOL  Take 2 tablets (650 mg total) by mouth every 6 (six) hours as needed.     fish oil-omega-3 fatty acids 1000 MG capsule  Take 2 g by mouth daily.     VITAMIN D PO  Take by mouth.        Discharge Instructions:     Follow-up Information   Call Robyne Askew, MD. (As needed)    Contact information:   [address] [place] [address] [phone]       Signed: Ashok Norris 04/25/2012, 9:00 AM  P [phone]

## 2012-04-25 NOTE — Progress Notes (Signed)
Feels better Walking well OK for d/c

## 2012-04-26 ENCOUNTER — Telehealth (INDEPENDENT_AMBULATORY_CARE_PROVIDER_SITE_OTHER): Payer: Self-pay | Admitting: *Deleted

## 2012-04-26 ENCOUNTER — Encounter (INDEPENDENT_AMBULATORY_CARE_PROVIDER_SITE_OTHER): Payer: Self-pay | Admitting: *Deleted

## 2012-04-26 NOTE — Telephone Encounter (Signed)
Patient called to request a return to work letter.  Letter faxed to [phone]

## 2012-04-30 ENCOUNTER — Encounter (INDEPENDENT_AMBULATORY_CARE_PROVIDER_SITE_OTHER): Payer: Self-pay | Admitting: General Surgery

## 2012-04-30 ENCOUNTER — Telehealth (INDEPENDENT_AMBULATORY_CARE_PROVIDER_SITE_OTHER): Payer: Self-pay | Admitting: General Surgery

## 2012-04-30 NOTE — Telephone Encounter (Signed)
Alan Riley, HR at [employer] called to clarify RTW note for this pt.  Per letter written today, pt is cleared to return to work without restrictions as of today.  Printed and FAXd the letter to her at [phone].

## 2012-06-21 ENCOUNTER — Telehealth: Payer: Self-pay | Admitting: Oncology

## 2012-06-21 NOTE — Telephone Encounter (Signed)
Received call re when pt's appt for DM will be. Pt's appts were in march - 3/21 appt was r/s to 3/31 and pt did not show. Returned call and lmonvm for pt to call to r/s for next available appts.

## 2012-06-21 NOTE — Telephone Encounter (Signed)
Pt dtr called back and was given an appt for 7/8.

## 2012-07-31 ENCOUNTER — Telehealth: Payer: Self-pay | Admitting: Hematology and Oncology

## 2012-07-31 ENCOUNTER — Other Ambulatory Visit (HOSPITAL_BASED_OUTPATIENT_CLINIC_OR_DEPARTMENT_OTHER): Payer: PRIVATE HEALTH INSURANCE | Admitting: Lab

## 2012-07-31 ENCOUNTER — Ambulatory Visit (HOSPITAL_BASED_OUTPATIENT_CLINIC_OR_DEPARTMENT_OTHER): Payer: PRIVATE HEALTH INSURANCE | Admitting: Hematology and Oncology

## 2012-07-31 VITALS — BP 129/72 | HR 63 | Temp 97.3°F | Wt 184.7 lb

## 2012-07-31 DIAGNOSIS — Z85038 Personal history of other malignant neoplasm of large intestine: Secondary | ICD-10-CM

## 2012-07-31 LAB — COMPREHENSIVE METABOLIC PANEL (CC13)
ALT: 21 U/L (ref 0–55)
Alkaline Phosphatase: 58 U/L (ref 40–150)
Sodium: 141 mEq/L (ref 136–145)
Total Bilirubin: 0.53 mg/dL (ref 0.20–1.20)
Total Protein: 7.2 g/dL (ref 6.4–8.3)

## 2012-07-31 LAB — CBC WITH DIFFERENTIAL/PLATELET
BASO%: 0.3 % (ref 0.0–2.0)
EOS%: 0.6 % (ref 0.0–7.0)
HGB: 14.6 g/dL (ref 13.0–17.1)
MCH: 32.6 pg (ref 27.2–33.4)
MCHC: 34.7 g/dL (ref 32.0–36.0)
RDW: 12.9 % (ref 11.0–14.6)
lymph#: 1.5 10*3/uL (ref 0.9–3.3)

## 2012-07-31 LAB — LACTATE DEHYDROGENASE (CC13): LDH: 157 U/L (ref 125–245)

## 2012-07-31 NOTE — Progress Notes (Signed)
CC:   Alan Riley. Alan Riley, M.D. Alan Riley, M.D. Alan Riley Alan Riley, M.D.  PROBLEM LIST: 1. Moderately differentiated near obstructing adenocarcinoma involving     the ascending colon with 6/14 positive lymph nodes, T3 N2, stage     IIIC, status post hemicolectomy on 01/15/2003.  Adjuvant     chemotherapy with FOLFOX 12 cycles were administered from     03/04/2003 through August 2005.  The patient remains without     evidence of recurrent disease. 2. History of adenomatous colonic polyps with most recent colonoscopy     on 03/15/2011. 3. History of iron-deficiency anemia secondary to colon cancer.  The patient is not on any medications at this time.  HISTORY:  Alan Riley was seen today for followup of his stage IIIC adenocarcinoma of the ascending colon dating back to December 2004.  The patient remains without evidence of disease.  He is without any complaints.  He continues to work at [employer].  The patient tells me that he underwent a colonoscopy by Dr. Matthias Riley on 03/15/2011. He did have some adenomatous polyps present.  He is without any complaints today.  Denies any obvious blood in his stools.  He feels fine and as usual is in good spirits.  PHYSICAL EXAM:  He looks well.  Weight today is 184.7 pounds up from 180.5 pounds 6 months ago.  Body surface area 2.03 sq/m.  Blood pressure 129/72.  Other vital signs are normal.  There is no scleral icterus. Mouth and pharynx are benign.  No peripheral adenopathy palpable.  Heart and lungs are normal.  Abdomen:  Benign with no organomegaly or masses palpable.  Extremities:  No peripheral edema or clubbing.  Neurologic: Grossly normal.  The patient did have a Port-A-Cath that was removed on September 25, 2007.  LABORATORY DATA:  CEA was 0.8.  IMAGING STUDIES: 1. CT scan of abdomen and pelvis with IV contrast from 12/25/2009     showed a small-bowel obstruction in the mid portion of the jejunum     likely relates to  adhesive disease.  There were postsurgical     changes of the right hemicolectomy without obstruction at the     anastomotic site. 2. Chest x-ray, 2 view, from 10/14/2010 showed possible right     pulmonary nodule versus nipple shadow.  Repeat PA view with nipple     markers was recommended. 3. Chest x-ray, special view, from 10/14/2010 showed no pulmonary     nodule and was a normal exam.  IMPRESSION AND PLAN:  Alan Riley continues to do well now over 8 years from the time of diagnosis.  As stated, he underwent a colonoscopy by Dr. Matthias Riley on 03/15/2011.  This showed adenomatous polyps.   Plan for another colonoscopy within the next few years.  We will plan to see Alan Riley again in 6 months at which time will check CEA.    Alan Dakins, MD 07/31/2012 4:33 PM

## 2012-07-31 NOTE — Telephone Encounter (Signed)
gv and printed appt sched and avs for pt  °

## 2012-08-01 LAB — CEA: CEA: 1.1 ng/mL (ref 0.0–5.0)

## 2012-08-29 ENCOUNTER — Other Ambulatory Visit: Payer: Self-pay

## 2012-11-29 ENCOUNTER — Other Ambulatory Visit: Payer: Self-pay

## 2013-01-29 ENCOUNTER — Ambulatory Visit (HOSPITAL_BASED_OUTPATIENT_CLINIC_OR_DEPARTMENT_OTHER): Payer: PRIVATE HEALTH INSURANCE | Admitting: Internal Medicine

## 2013-01-29 ENCOUNTER — Other Ambulatory Visit: Payer: Self-pay | Admitting: Internal Medicine

## 2013-01-29 ENCOUNTER — Other Ambulatory Visit (HOSPITAL_BASED_OUTPATIENT_CLINIC_OR_DEPARTMENT_OTHER): Payer: PRIVATE HEALTH INSURANCE

## 2013-01-29 VITALS — BP 146/79 | HR 69 | Temp 97.5°F | Resp 18 | Ht 69.0 in | Wt 186.3 lb

## 2013-01-29 DIAGNOSIS — Z85038 Personal history of other malignant neoplasm of large intestine: Secondary | ICD-10-CM

## 2013-01-29 LAB — COMPREHENSIVE METABOLIC PANEL (CC13)
ALBUMIN: 4.3 g/dL (ref 3.5–5.0)
ALT: 21 U/L (ref 0–55)
ANION GAP: 8 meq/L (ref 3–11)
AST: 16 U/L (ref 5–34)
Alkaline Phosphatase: 56 U/L (ref 40–150)
BUN: 9.5 mg/dL (ref 7.0–26.0)
CALCIUM: 9.2 mg/dL (ref 8.4–10.4)
CHLORIDE: 104 meq/L (ref 98–109)
CO2: 26 meq/L (ref 22–29)
Creatinine: 0.8 mg/dL (ref 0.7–1.3)
Glucose: 110 mg/dl (ref 70–140)
POTASSIUM: 3.7 meq/L (ref 3.5–5.1)
Sodium: 139 mEq/L (ref 136–145)
Total Bilirubin: 0.71 mg/dL (ref 0.20–1.20)
Total Protein: 7.4 g/dL (ref 6.4–8.3)

## 2013-01-29 LAB — CBC WITH DIFFERENTIAL/PLATELET
BASO%: 0.3 % (ref 0.0–2.0)
BASOS ABS: 0 10*3/uL (ref 0.0–0.1)
EOS%: 0.3 % (ref 0.0–7.0)
Eosinophils Absolute: 0 10*3/uL (ref 0.0–0.5)
HEMATOCRIT: 42.3 % (ref 38.4–49.9)
HEMOGLOBIN: 14.7 g/dL (ref 13.0–17.1)
LYMPH#: 1.4 10*3/uL (ref 0.9–3.3)
LYMPH%: 19.5 % (ref 14.0–49.0)
MCH: 32.5 pg (ref 27.2–33.4)
MCHC: 34.6 g/dL (ref 32.0–36.0)
MCV: 93.8 fL (ref 79.3–98.0)
MONO#: 0.8 10*3/uL (ref 0.1–0.9)
MONO%: 10.4 % (ref 0.0–14.0)
NEUT#: 5.1 10*3/uL (ref 1.5–6.5)
NEUT%: 69.5 % (ref 39.0–75.0)
Platelets: 168 10*3/uL (ref 140–400)
RBC: 4.51 10*6/uL (ref 4.20–5.82)
RDW: 12.9 % (ref 11.0–14.6)
WBC: 7.4 10*3/uL (ref 4.0–10.3)

## 2013-01-30 ENCOUNTER — Encounter: Payer: Self-pay | Admitting: Internal Medicine

## 2013-01-30 ENCOUNTER — Telehealth: Payer: Self-pay | Admitting: Internal Medicine

## 2013-01-30 LAB — CEA: CEA: 0.8 ng/mL (ref 0.0–5.0)

## 2013-01-30 NOTE — Telephone Encounter (Signed)
Mailed the pt his jan 2016 appt calendar

## 2013-01-30 NOTE — Progress Notes (Signed)
[facility] OFFICE PROGRESS NOTE  Alan Riley. Daiva Nakayama, MD Ronald Lobo, MD Lina Sayre. Everlene Farrier, MD  DIAGNOSIS: History of colon cancer, stage III - Plan: CBC with Differential, Comprehensive metabolic panel (Cmet) - [facility], CEA  Chief Complaint  Patient presents with  . History of colon cancer, stage III   CURRENT THERAPY: Observation.   INTERVAL HISTORY: Alan Riley 68 y.o. male with a history of stage IIIC adenocarcinoma of the ascending colon dating back to December 2004 is here for follow-up.  He was last seen by Dr. Orie Fisherman on 07/31/2012. He is accompanied by his son Darko.  He speaks and understands English with minimal assistance from his son.  His native language is [place]. The patient remains without evidence of disease. He is without any complaints. He continues to work at [employer]. He had a colonoscopy by Dr. Cristina Gong on 03/15/2011 revealing some adenomatous polyps present. He denies any obvious blood in his stools. 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  He works without difficulty.  His weight is stable.  The quality of his stools remain unchanged. He denies hematochezia or melena.   MEDICAL HISTORY: Past Medical History  Diagnosis Date  . Malignant neoplasm of ascending colon   . Iron deficiency anemia secondary to blood loss (chronic)   . Hx of adenomatous colonic polyps     INTERIM HISTORY: has History of colon cancer, stage III; Nocturia; Impaired fasting glucose; Screening for prostate cancer; and Small bowel obstruction on his problem list.    ALLERGIES:  has No Known Allergies.  MEDICATIONS: has a current medication list which includes the following prescription(s): acetaminophen, cholecalciferol, and fish oil-omega-3 fatty acids.  SURGICAL HISTORY:  Past Surgical History  Procedure Laterality Date  . Hemicolectomy  01/15/03  . Cholecystectomy  1991   PROBLEM LIST:  1. Moderately differentiated near obstructing  adenocarcinoma involving the ascending colon with 6/14 positive lymph nodes, T3 N2, stage IIIC, status post hemicolectomy on 01/15/2003. Adjuvant chemotherapy with FOLFOX 12 cycles were administered from 03/04/2003 through August 2005. The patient remains without evidence of recurrent disease.  2. History of adenomatous colonic polyps with most recent colonoscopy on 03/15/2011.  3. History of iron-deficiency anemia secondary to colon cancer.   REVIEW OF SYSTEMS:   Constitutional: Denies fevers, chills or abnormal weight loss Eyes: Denies blurriness of vision Ears, nose, mouth, throat, and face: Denies mucositis or sore throat Respiratory: Denies cough, dyspnea or wheezes Cardiovascular: Denies palpitation, chest discomfort or lower extremity swelling Gastrointestinal:  Denies nausea, heartburn or change in bowel habits Skin: Denies abnormal skin rashes Lymphatics: Denies new lymphadenopathy or easy bruising Neurological:Denies numbness, tingling or new weaknesses Behavioral/Psych: Mood is stable, no new changes  All other systems were reviewed with the patient and are negative.  PHYSICAL EXAMINATION: ECOG PERFORMANCE STATUS: 0 - Asymptomatic  Blood pressure 146/79, pulse 69, temperature 97.5 F (36.4 C), temperature source Oral, resp. rate 18, height 5\' 9"  (1.753 m), weight 186 lb 4.8 oz (84.505 kg).  GENERAL:alert, no distress and comfortable; well developed and well nourished.  Bosnian speaker intermittently.  SKIN: skin color, texture, turgor are normal, no rashes or significant lesions EYES: normal, Conjunctiva are pink and non-injected, sclera clear OROPHARYNX:no exudate, no erythema and lips, buccal mucosa, and tongue normal  NECK: supple, thyroid normal size, non-tender, without nodularity LYMPH:  no palpable lymphadenopathy in the cervical, axillary or supraclavicular LUNGS: clear to auscultation and percussion with normal breathing effort HEART: regular rate &  rhythm and no  murmurs and no lower extremity edema ABDOMEN:abdomen soft, non-tender and normal bowel sounds; multiple scars well healed.  Musculoskeletal:no cyanosis of digits and no clubbing  NEURO: alert & oriented x 3 with fluent speech, no focal motor/sensory deficits   LABORATORY DATA: Results for orders placed in visit on 01/29/13 (from the past 48 hour(s))  CBC WITH DIFFERENTIAL     Status: None   Collection Time    01/29/13  2:33 PM      Result Value Range   WBC 7.4  4.0 - 10.3 10e3/uL   NEUT# 5.1  1.5 - 6.5 10e3/uL   HGB 14.7  13.0 - 17.1 g/dL   HCT 42.3  38.4 - 49.9 %   Platelets 168  140 - 400 10e3/uL   MCV 93.8  79.3 - 98.0 fL   MCH 32.5  27.2 - 33.4 pg   MCHC 34.6  32.0 - 36.0 g/dL   RBC 4.51  4.20 - 5.82 10e6/uL   RDW 12.9  11.0 - 14.6 %   lymph# 1.4  0.9 - 3.3 10e3/uL   MONO# 0.8  0.1 - 0.9 10e3/uL   Eosinophils Absolute 0.0  0.0 - 0.5 10e3/uL   Basophils Absolute 0.0  0.0 - 0.1 10e3/uL   NEUT% 69.5  39.0 - 75.0 %   LYMPH% 19.5  14.0 - 49.0 %   MONO% 10.4  0.0 - 14.0 %   EOS% 0.3  0.0 - 7.0 %   BASO% 0.3  0.0 - 2.0 %  CEA     Status: None   Collection Time    01/29/13  2:33 PM      Result Value Range   CEA 0.8  0.0 - 5.0 ng/mL  COMPREHENSIVE METABOLIC PANEL (DG38)     Status: None   Collection Time    01/29/13  2:33 PM      Result Value Range   Sodium 139  136 - 145 mEq/L   Potassium 3.7  3.5 - 5.1 mEq/L   Chloride 104  98 - 109 mEq/L   CO2 26  22 - 29 mEq/L   Glucose 110  70 - 140 mg/dl   BUN 9.5  7.0 - 26.0 mg/dL   Creatinine 0.8  0.7 - 1.3 mg/dL   Total Bilirubin 0.71  0.20 - 1.20 mg/dL   Alkaline Phosphatase 56  40 - 150 U/L   AST 16  5 - 34 U/L   ALT 21  0 - 55 U/L   Total Protein 7.4  6.4 - 8.3 g/dL   Albumin 4.3  3.5 - 5.0 g/dL   Calcium 9.2  8.4 - 10.4 mg/dL   Anion Gap 8  3 - 11 mEq/L    Labs:  Lab Results  Component Value Date   WBC 7.4 01/29/2013   HGB 14.7 01/29/2013   HCT 42.3 01/29/2013   MCV 93.8 01/29/2013   PLT 168 01/29/2013   NEUTROABS 5.1  01/29/2013      Chemistry      Component Value Date/Time   NA 139 01/29/2013 1433   NA 139 04/24/2012 0916   K 3.7 01/29/2013 1433   K 3.8 04/24/2012 0916   CL 102 04/24/2012 0916   CO2 26 01/29/2013 1433   CO2 22 04/24/2012 0916   BUN 9.5 01/29/2013 1433   BUN 11 04/24/2012 0916   CREATININE 0.8 01/29/2013 1433   CREATININE 0.64 04/24/2012 0916   CREATININE 0.76 12/09/2011 1011  Component Value Date/Time   CALCIUM 9.2 01/29/2013 1433   CALCIUM 8.6 04/24/2012 0916   ALKPHOS 56 01/29/2013 1433   ALKPHOS 59 04/21/2012 1759   AST 16 01/29/2013 1433   AST 20 04/21/2012 1759   ALT 21 01/29/2013 1433   ALT 20 04/21/2012 1759   BILITOT 0.71 01/29/2013 1433   BILITOT 0.5 04/21/2012 1759     Results for YANN, BIEHN (MRN [id number]) as of 01/30/2013 12:18  Ref. Range 10/14/2010 10:59 04/14/2011 11:23 04/22/2012 05:50 07/31/2012 15:47 01/29/2013 14:33  CEA Latest Range: 0.0-5.0 ng/mL 0.8 0.7 0.8 1.1 0.8   Basic Metabolic Panel:  Recent Labs Lab 01/29/13 1433  NA 139  K 3.7  CO2 26  GLUCOSE 110  BUN 9.5  CREATININE 0.8  CALCIUM 9.2   GFR Estimated Creatinine Clearance: 90.8 ml/min (by C-G formula based on Cr of 0.8). Liver Function Tests:  Recent Labs Lab 01/29/13 1433  AST 16  ALT 21  ALKPHOS 56  BILITOT 0.71  PROT 7.4  ALBUMIN 4.3   No results found for this basename: LIPASE, AMYLASE,  in the last 168 hours No results found for this basename: AMMONIA,  in the last 168 hours Coagulation profile No results found for this basename: INR, PROTIME,  in the last 168 hours  CBC:  Recent Labs Lab 01/29/13 1433  WBC 7.4  NEUTROABS 5.1  HGB 14.7  HCT 42.3  MCV 93.8  PLT 168   Studies:  No results found.   RADIOGRAPHIC STUDIES:  1. CT scan of abdomen and pelvis with IV contrast from 12/25/2009 showed a small-bowel obstruction in the mid portion of the jejunum likely relates to adhesive disease. There were postsurgical changes of the right hemicolectomy without obstruction at the anastomotic  site.  2. Chest x-ray, 2 view, from 10/14/2010 showed possible right pulmonary nodule versus nipple shadow. Repeat PA view with nipple markers was recommended.  3. Chest x-ray, special view, from 10/14/2010 showed no pulmonary nodule and was a normal exam.   ASSESSMENT: Jodell Cipro 68 y.o. male with a history of History of colon cancer, stage III - Plan: CBC with Differential, Comprehensive metabolic panel (Cmet) - [facility], CEA  PLAN:   1. History of colon cancer, stage III. -- Mr. Deschene continues to do well now over 8 years from the time of diagnosis. He had moderately differentiated near obstructing adenocarcinoma involving the ascending colon with 6/14 positive lymph nodes, T3 N2, stage IIIC, status post hemicolectomy on 01/15/2003. Adjuvant chemotherapy with FOLFOX 12 cycles were administered from 03/04/2003 through August 2005. The patient remains without evidence of recurrent disease. CEA remains stable.  --As stated, he underwent a colonoscopy by Dr. Cristina Gong on 03/15/2011. This showed adenomatous polyps. Plan for another colonoscopy within the next few years.   2. Follow-up.  --We will plan to see Mr. Roop again in 6 months at which time will check CEA.   All questions were answered. The patient knows to call the clinic with any problems, questions or concerns. We can certainly see the patient much sooner if necessary.  I spent 15 minutes counseling the patient face to face. The total time spent in the appointment was 25 minutes.    Charmian Forbis, MD 01/30/2013 12:17 PM

## 2013-02-22 ENCOUNTER — Ambulatory Visit (INDEPENDENT_AMBULATORY_CARE_PROVIDER_SITE_OTHER): Payer: PRIVATE HEALTH INSURANCE | Admitting: Family Medicine

## 2013-02-22 ENCOUNTER — Encounter: Payer: Self-pay | Admitting: Family Medicine

## 2013-02-22 VITALS — BP 130/77 | HR 75 | Ht 69.0 in | Wt 182.8 lb

## 2013-02-22 DIAGNOSIS — Z125 Encounter for screening for malignant neoplasm of prostate: Secondary | ICD-10-CM

## 2013-02-22 DIAGNOSIS — R252 Cramp and spasm: Secondary | ICD-10-CM

## 2013-02-22 DIAGNOSIS — R7301 Impaired fasting glucose: Secondary | ICD-10-CM

## 2013-02-22 LAB — LIPID PANEL
CHOL/HDL RATIO: 6 ratio
Cholesterol: 179 mg/dL (ref 0–200)
HDL: 30 mg/dL — AB (ref >39)
LDL CALC: 103 mg/dL — AB (ref 0–99)
Triglycerides: 231 mg/dL — ABNORMAL HIGH (ref <150)
VLDL: 46 mg/dL — ABNORMAL HIGH (ref 0–40)

## 2013-02-22 LAB — POCT GLYCOSYLATED HEMOGLOBIN (HGB A1C): Hemoglobin A1C: 5.3

## 2013-02-22 NOTE — Progress Notes (Signed)
Patient here for annual exam. He is accompanied by his wife.  Recently seen by his oncologist for follow up for his Stage III colon cancer, he is now 8 years s/p treatment and without evidence of continued disease.  He denies constipation, blood per rectum, change in stool patterns.  Feels well.    He has a history of IFG in the past, never diagnosed with DM.   Family Hx; He has no family history of cancer of any kind.   Social Hx Works Engineer, technical sales.  Never-smoker. From [place], native language is [place].    ROS: No fevers/chills, no weight loss, no cough, no chest pain, no N/V, no bowel change. May get up one time at night to void, no decreased stream or gross hematuria.  Has had some swelling in the front of his lower legs.  Does complain of night cramps from time to time. Wife says it is bothersome to him.   EXAM: Well appearing, no apparent distress HEENT Neck supple, no cervical adenopathy. Conjunctivae normal (no pallor).  Clear oropharynx. Thyroid supple without nodules.  COR Regular S1S2, no extra sounds PULM Clear bilaterally, no rales or wheezes ABD Soft, nontender, nondistended. Normal audible bowel sounds heard.  EXTS: notable anterior tibial edema bilat, more notable on the R than L. No ankle edema. Palpable dp pulses.  No skin lesions on feet;

## 2013-02-22 NOTE — Assessment & Plan Note (Signed)
No family hx of prostate or any other cancer; no sxs to suggest prostatic disease.  Prior PSA unremarkable.  To recheck today.

## 2013-02-22 NOTE — Assessment & Plan Note (Signed)
To check A1C today, along with lipid panel.

## 2013-02-23 LAB — PSA: PSA: 1.05 ng/mL (ref <=4.00)

## 2013-02-27 ENCOUNTER — Encounter: Payer: Self-pay | Admitting: Family Medicine

## 2013-11-26 ENCOUNTER — Ambulatory Visit: Payer: PRIVATE HEALTH INSURANCE | Admitting: Family Medicine

## 2013-11-29 ENCOUNTER — Encounter: Payer: Self-pay | Admitting: Family Medicine

## 2013-11-29 ENCOUNTER — Ambulatory Visit (INDEPENDENT_AMBULATORY_CARE_PROVIDER_SITE_OTHER): Payer: No Typology Code available for payment source | Admitting: Family Medicine

## 2013-11-29 VITALS — BP 134/70 | HR 67 | Temp 98.6°F | Ht 69.0 in | Wt 183.9 lb

## 2013-11-29 DIAGNOSIS — E7800 Pure hypercholesterolemia, unspecified: Secondary | ICD-10-CM

## 2013-11-29 DIAGNOSIS — C44319 Basal cell carcinoma of skin of other parts of face: Secondary | ICD-10-CM | POA: Insufficient documentation

## 2013-11-29 DIAGNOSIS — R7301 Impaired fasting glucose: Secondary | ICD-10-CM

## 2013-11-29 LAB — POCT GLYCOSYLATED HEMOGLOBIN (HGB A1C): HEMOGLOBIN A1C: 5.7

## 2013-11-29 MED ORDER — ATORVASTATIN CALCIUM 20 MG PO TABS: 20.0000 mg | ORAL_TABLET | Freq: Every day | ORAL | Status: DC

## 2013-11-29 NOTE — Patient Instructions (Signed)
It was a pleasure to see you today.   For the skin issues, I am referring you to a Dermatologist for removal of the scalp lesions.   I am checking your fasting sugar today.   For your cholesterol, I am starting you on a low-dose of a medicine called ATORVASTATIN 20mg , take 1 tablet at bedtime.   I am glad you already got your flu shot!

## 2013-11-29 NOTE — Assessment & Plan Note (Signed)
Patient recommended for moderate-to-high dose statin based on 62yr CVD risk.  Discussed risk/benefits, patient is amenable to trial of atorvastatin 20mg  at night. He is to hold the medicine and call if he experiences side effects.  May recheck lipids again in several months, may affect management decision to increase dose.

## 2013-11-29 NOTE — Progress Notes (Signed)
   Subjective:    Patient ID: Alan Riley, male    DOB: 1945-02-17, 68 y.o.   MRN: [id number]  HPI Patient here for follow up.  His primary concern is the development of two new skin lesions that have been growing in the past 3-4 months.  One on his R temple that is raised; the other on the crown of his head, flat.  Neither is painful or pruritic.  No prior similar skin lesions.   Patient follows with Dr Cristina Gong for history of colon cancer; last seen in January, has repeat C-scopes every 3 years by patient report. Reports he is doing well.   Reviewed patient's other health maintenance/screening measures.  He had a lipid panel done in January 2015; based on the 10-year risk calculator, he is recommended for a moderate-to-high dose statin therapy for prevention of MI/CVA>  Discussed this with patients, including side effect profile.  Patient is amenable to treatment.   Social Hx; Nonsmoker (never-smoker); no alcohol.  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 Seasonal work is heavy now, no days off during holidays.   Review of Systems Denies chest pain, shortness of breath, fevers/chills/sweats, no weight loss. No dysuria. No blood per rectum. Received flu shot at work in October.      Objective:   Physical Exam Well appearing, no apparent distress HEENT: 34mm diameter raised dome-shaped hyperpigmented lesion with rim of erythema, over lateral edge of R eyebrow.  Flattened crusty, dark skin lesion measuring 64mm on crown of scalp, with trace rim of surrounding erythema. No palpable ant/posterior cervical adenopathy.  SKIN Skin survey on rest of body without concerning lesions.  COR Regular S1S2, no extra sounds PULM Clear bilaterally, no rales or wheezes ABD Soft, nontender, nondistended. No organomegaly.        Assessment & Plan:

## 2013-11-29 NOTE — Assessment & Plan Note (Signed)
Right temple measuring 55mm; crown of scalp measuring 68mm diameter. Suspicious for basal cell, for referral to Derm for excision with margins.  Discussed with patient the option of having elliptical excision done in our office, versus referral to Derm.  Concerns about cosmesis given location on the patient's face. Also, would need to have margins excised, which might require referral if not clean margins after first excision attempt.

## 2013-11-30 LAB — COMPREHENSIVE METABOLIC PANEL
ALK PHOS: 50 U/L (ref 39–117)
ALT: 18 U/L (ref 0–53)
AST: 18 U/L (ref 0–37)
Albumin: 4.4 g/dL (ref 3.5–5.2)
BILIRUBIN TOTAL: 0.6 mg/dL (ref 0.2–1.2)
BUN: 10 mg/dL (ref 6–23)
CO2: 25 mEq/L (ref 19–32)
CREATININE: 0.77 mg/dL (ref 0.50–1.35)
Calcium: 9 mg/dL (ref 8.4–10.5)
Chloride: 105 mEq/L (ref 96–112)
Glucose, Bld: 107 mg/dL — ABNORMAL HIGH (ref 70–99)
Potassium: 3.9 mEq/L (ref 3.5–5.3)
SODIUM: 140 meq/L (ref 135–145)
Total Protein: 6.5 g/dL (ref 6.0–8.3)

## 2013-12-02 ENCOUNTER — Encounter: Payer: Self-pay | Admitting: Family Medicine

## 2013-12-29 ENCOUNTER — Inpatient Hospital Stay (HOSPITAL_COMMUNITY)
Admission: EM | Admit: 2013-12-29 | Discharge: 2014-01-01 | DRG: 390 | Disposition: A | Discharge status: Home / Self Care | Payer: No Typology Code available for payment source | Attending: Family Medicine | Admitting: Family Medicine

## 2013-12-29 ENCOUNTER — Emergency Department (HOSPITAL_COMMUNITY): Payer: No Typology Code available for payment source

## 2013-12-29 ENCOUNTER — Encounter (HOSPITAL_COMMUNITY): Payer: Self-pay | Admitting: *Deleted

## 2013-12-29 DIAGNOSIS — K4090 Unilateral inguinal hernia, without obstruction or gangrene, not specified as recurrent: Secondary | ICD-10-CM | POA: Diagnosis present

## 2013-12-29 DIAGNOSIS — Z9049 Acquired absence of other specified parts of digestive tract: Secondary | ICD-10-CM | POA: Diagnosis present

## 2013-12-29 DIAGNOSIS — E780 Pure hypercholesterolemia: Secondary | ICD-10-CM | POA: Diagnosis present

## 2013-12-29 DIAGNOSIS — M8580 Other specified disorders of bone density and structure, unspecified site: Secondary | ICD-10-CM | POA: Diagnosis present

## 2013-12-29 DIAGNOSIS — R1084 Generalized abdominal pain: Secondary | ICD-10-CM | POA: Insufficient documentation

## 2013-12-29 DIAGNOSIS — D500 Iron deficiency anemia secondary to blood loss (chronic): Secondary | ICD-10-CM | POA: Diagnosis present

## 2013-12-29 DIAGNOSIS — Z86010 Personal history of colonic polyps: Secondary | ICD-10-CM | POA: Diagnosis not present

## 2013-12-29 DIAGNOSIS — Z85038 Personal history of other malignant neoplasm of large intestine: Secondary | ICD-10-CM

## 2013-12-29 DIAGNOSIS — E785 Hyperlipidemia, unspecified: Secondary | ICD-10-CM | POA: Diagnosis present

## 2013-12-29 DIAGNOSIS — Z85828 Personal history of other malignant neoplasm of skin: Secondary | ICD-10-CM

## 2013-12-29 DIAGNOSIS — K56609 Unspecified intestinal obstruction, unspecified as to partial versus complete obstruction: Secondary | ICD-10-CM | POA: Diagnosis present

## 2013-12-29 DIAGNOSIS — K56600 Partial intestinal obstruction, unspecified as to cause: Secondary | ICD-10-CM

## 2013-12-29 DIAGNOSIS — E876 Hypokalemia: Secondary | ICD-10-CM | POA: Diagnosis present

## 2013-12-29 DIAGNOSIS — I2510 Atherosclerotic heart disease of native coronary artery without angina pectoris: Secondary | ICD-10-CM | POA: Diagnosis present

## 2013-12-29 DIAGNOSIS — K5669 Other intestinal obstruction: Principal | ICD-10-CM | POA: Diagnosis present

## 2013-12-29 DIAGNOSIS — R109 Unspecified abdominal pain: Secondary | ICD-10-CM

## 2013-12-29 HISTORY — DX: Partial intestinal obstruction, unspecified as to cause: K56.600

## 2013-12-29 LAB — URINALYSIS, ROUTINE W REFLEX MICROSCOPIC
Bilirubin Urine: NEGATIVE
GLUCOSE, UA: NEGATIVE mg/dL
Hgb urine dipstick: NEGATIVE
Ketones, ur: 15 mg/dL — AB
LEUKOCYTES UA: NEGATIVE
Nitrite: NEGATIVE
PROTEIN: NEGATIVE mg/dL
Specific Gravity, Urine: 1.029 (ref 1.005–1.030)
UROBILINOGEN UA: 0.2 mg/dL (ref 0.0–1.0)
pH: 6 (ref 5.0–8.0)

## 2013-12-29 LAB — COMPREHENSIVE METABOLIC PANEL
ALT: 19 U/L (ref 0–53)
AST: 20 U/L (ref 0–37)
Albumin: 4 g/dL (ref 3.5–5.2)
Alkaline Phosphatase: 52 U/L (ref 39–117)
Anion gap: 12 (ref 5–15)
BUN: 13 mg/dL (ref 6–23)
CALCIUM: 9 mg/dL (ref 8.4–10.5)
CO2: 26 meq/L (ref 19–32)
CREATININE: 0.87 mg/dL (ref 0.50–1.35)
Chloride: 103 mEq/L (ref 96–112)
GFR, EST NON AFRICAN AMERICAN: 87 mL/min — AB (ref >90)
Glucose, Bld: 138 mg/dL — ABNORMAL HIGH (ref 70–99)
Potassium: 4.2 mEq/L (ref 3.7–5.3)
Sodium: 141 mEq/L (ref 137–147)
Total Bilirubin: 0.7 mg/dL (ref 0.3–1.2)
Total Protein: 6.9 g/dL (ref 6.0–8.3)

## 2013-12-29 LAB — CBC WITH DIFFERENTIAL/PLATELET
BASOS ABS: 0 10*3/uL (ref 0.0–0.1)
Basophils Relative: 0 % (ref 0–1)
EOS ABS: 0 10*3/uL (ref 0.0–0.7)
EOS PCT: 0 % (ref 0–5)
HEMATOCRIT: 42.2 % (ref 39.0–52.0)
Hemoglobin: 14.3 g/dL (ref 13.0–17.0)
LYMPHS PCT: 5 % — AB (ref 12–46)
Lymphs Abs: 0.7 10*3/uL (ref 0.7–4.0)
MCH: 32.4 pg (ref 26.0–34.0)
MCHC: 33.9 g/dL (ref 30.0–36.0)
MCV: 95.7 fL (ref 78.0–100.0)
MONO ABS: 1.1 10*3/uL — AB (ref 0.1–1.0)
Monocytes Relative: 7 % (ref 3–12)
Neutro Abs: 13.3 10*3/uL — ABNORMAL HIGH (ref 1.7–7.7)
Neutrophils Relative %: 88 % — ABNORMAL HIGH (ref 43–77)
Platelets: 144 10*3/uL — ABNORMAL LOW (ref 150–400)
RBC: 4.41 MIL/uL (ref 4.22–5.81)
RDW: 12.5 % (ref 11.5–15.5)
WBC: 15.1 10*3/uL — ABNORMAL HIGH (ref 4.0–10.5)

## 2013-12-29 LAB — TROPONIN I

## 2013-12-29 IMAGING — DX DG ABDOMEN ACUTE W/ 1V CHEST
3 series · 3 of 3 positions shown · non-contrast
Comparison: Abdominal radiograph performed [DATE], and chest
radiograph performed [DATE]

CLINICAL DATA: Acute onset of generalized abdominal pain and
vomiting. Initial encounter.

EXAM:
ACUTE ABDOMEN SERIES (ABDOMEN 2 VIEW & CHEST 1 VIEW)

[chest pa]
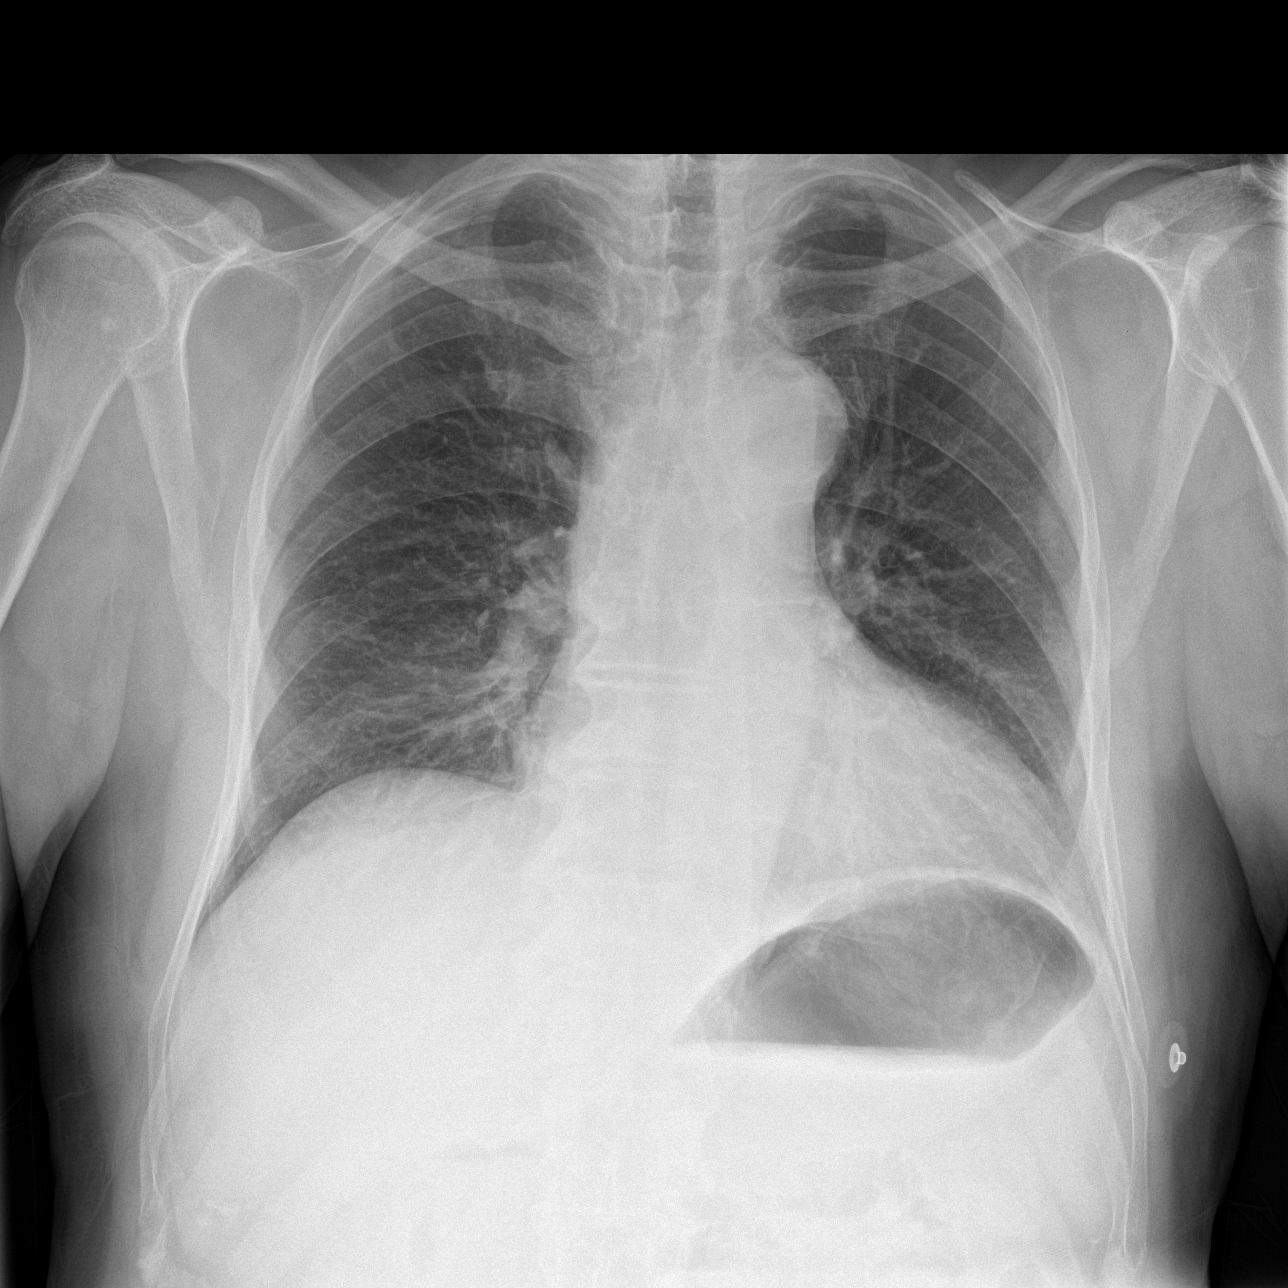

[abdomen erect]
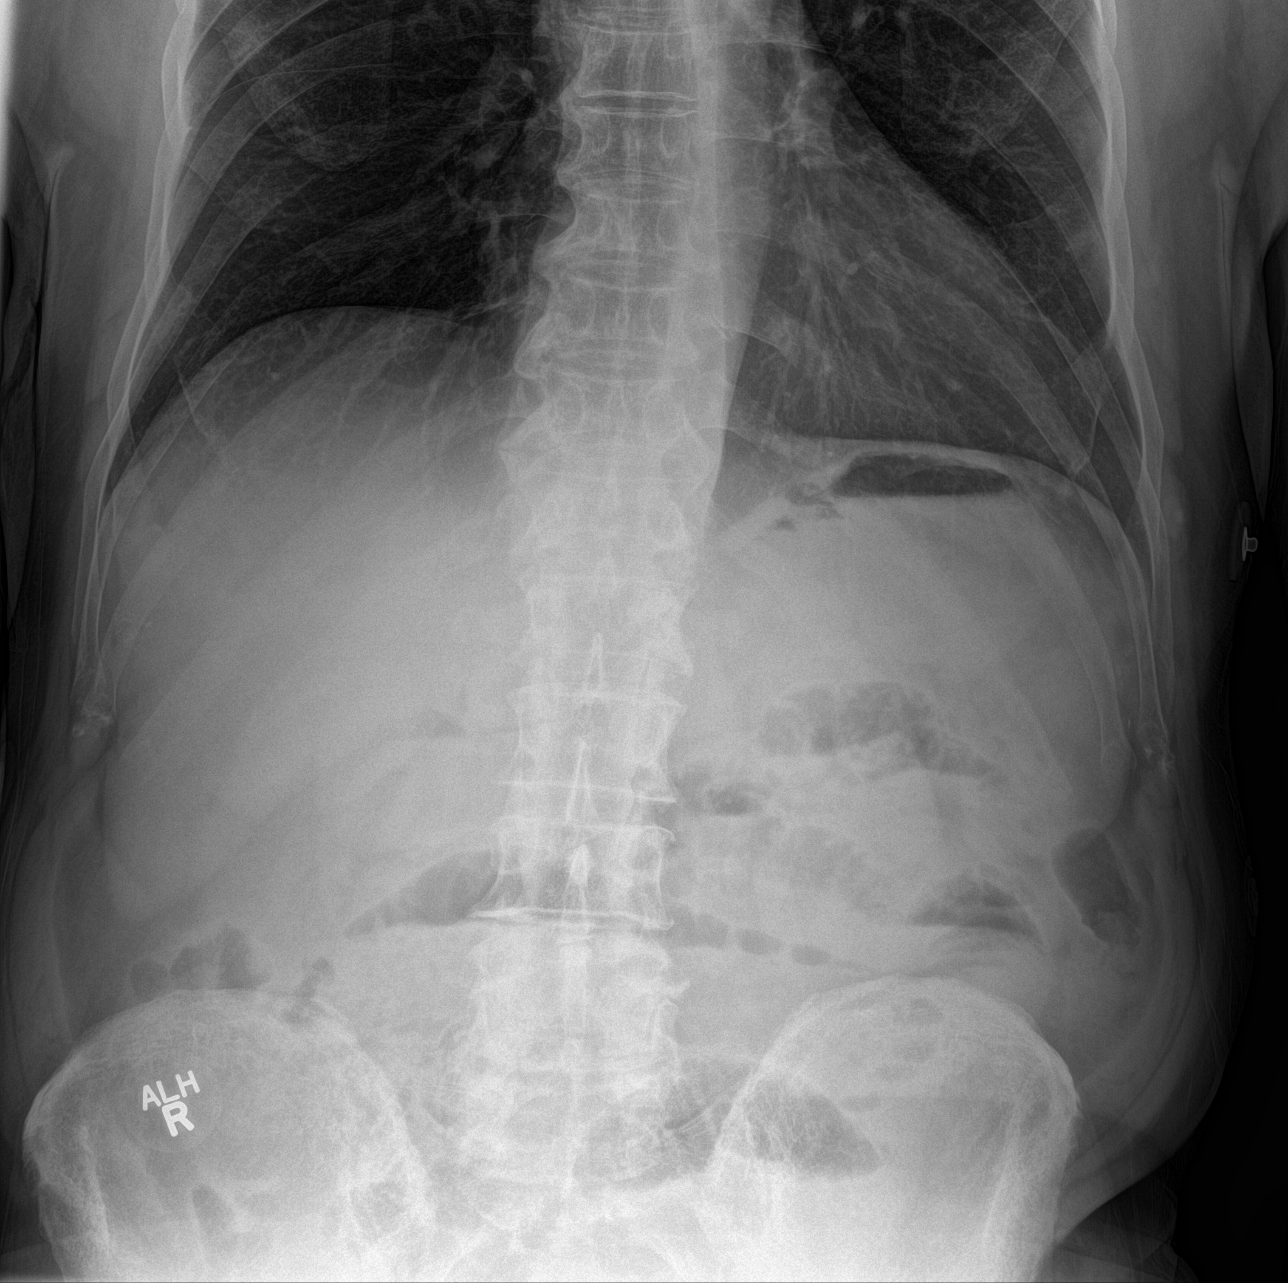

[abdomen supine]
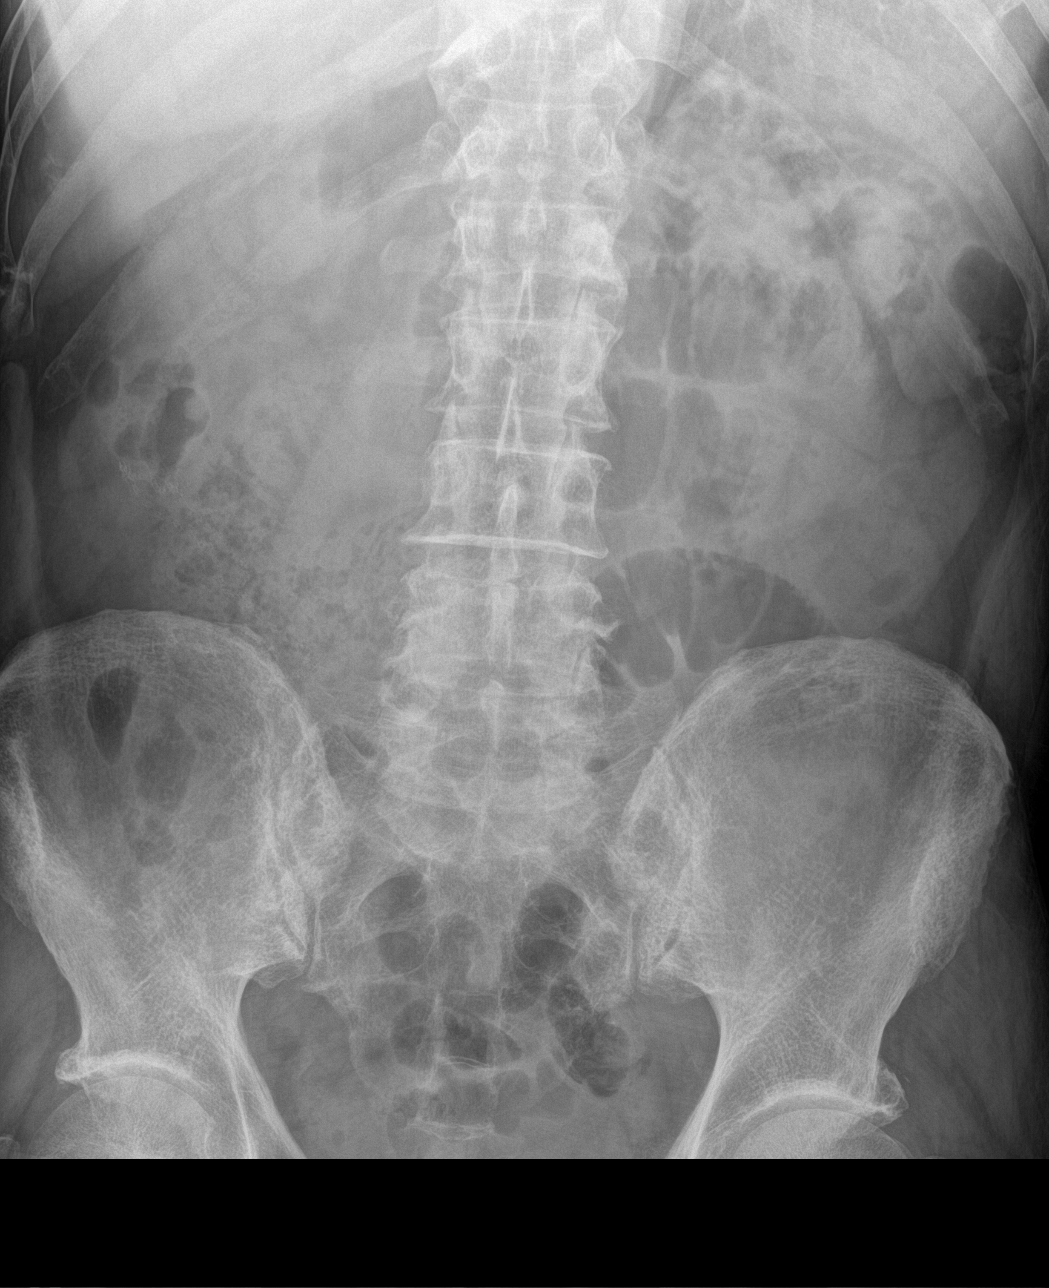

[3 of 3 positions shown; findings below may reference images not displayed]

FINDINGS: The lungs are well-aerated. Mild peribronchial thickening is noted.
Mild bibasilar airspace opacities likely reflect atelectasis. There
is no evidence of pleural effusion or pneumothorax. The
cardiomediastinal silhouette is borderline enlarged.

The visualized bowel gas pattern is nonspecific. There is distention
of small-bowel loops to 3.9 cm in maximal diameter, without definite
evidence for bowel obstruction. Stool is still seen in the colon. No
free intra-abdominal air is identified on the provided upright view.

No acute osseous abnormalities are seen; the sacroiliac joints are
unremarkable in appearance.
IMPRESSION: 1. Nonspecific bowel gas pattern; distention of small-bowel loops to
3.9 cm in maximal diameter, with stool still seen in the colon. This
could reflect mild ileus, although partial small bowel obstruction
might have a similar appearance. No free intra-abdominal air seen.
2. Mild bibasilar airspace opacities likely reflect atelectasis;
mild peribronchial thickening noted. Borderline cardiomegaly.

## 2013-12-29 IMAGING — CT CT ABD-PELV W/ CM
2 of 5 series · 4 of 46 positions shown, 6 images · IV contrast (Iodine)
Comparison: CT of the abdomen and pelvis performed [DATE]

CLINICAL DATA: Acute onset of nausea and vomiting. Initial
encounter.

EXAM:
CT ABDOMEN AND PELVIS WITH CONTRAST
TECHNIQUE: Multidetector CT imaging of the abdomen and pelvis was performed
using the standard protocol following bolus administration of
intravenous contrast.
CONTRAST:  100mL OMNIPAQUE IOHEXOL 300 MG/ML  SOLN

[Series 204: coronals · coronal · 0.50mm/px · 3 of 131 slices shown, 4 images]
[im 29/131  soft-tissue]
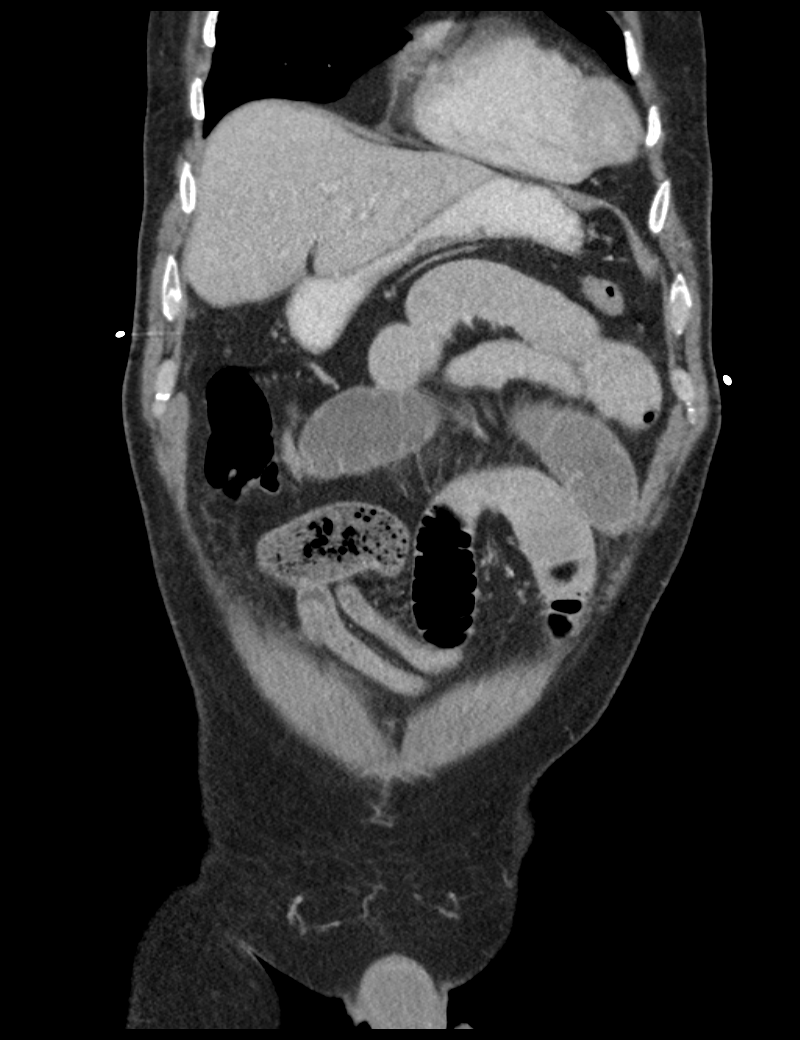
[im 29/131  bone]
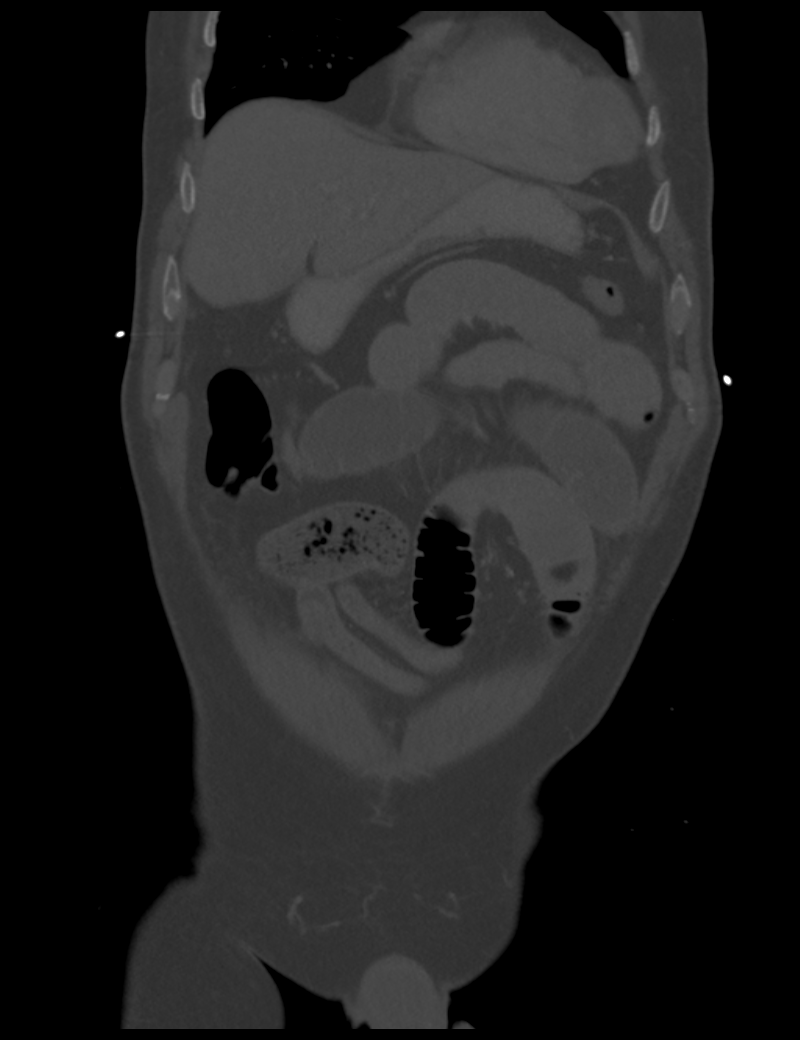
[im 73/131  soft-tissue]
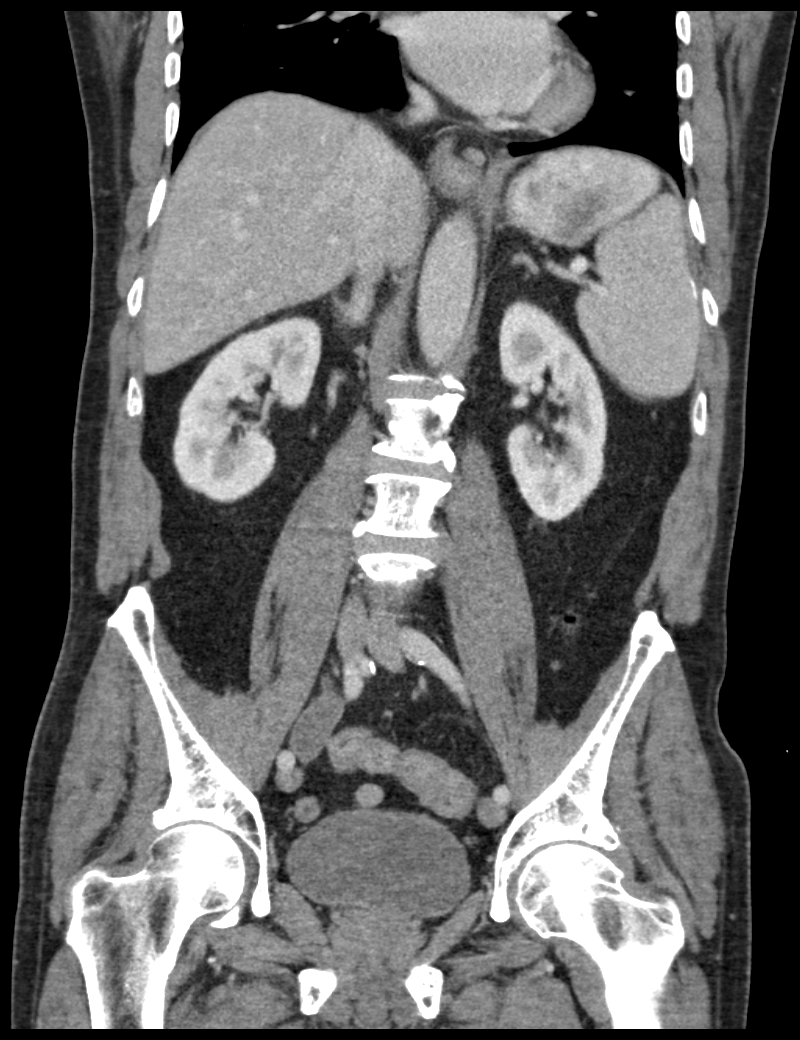
[im 102/131  soft-tissue]
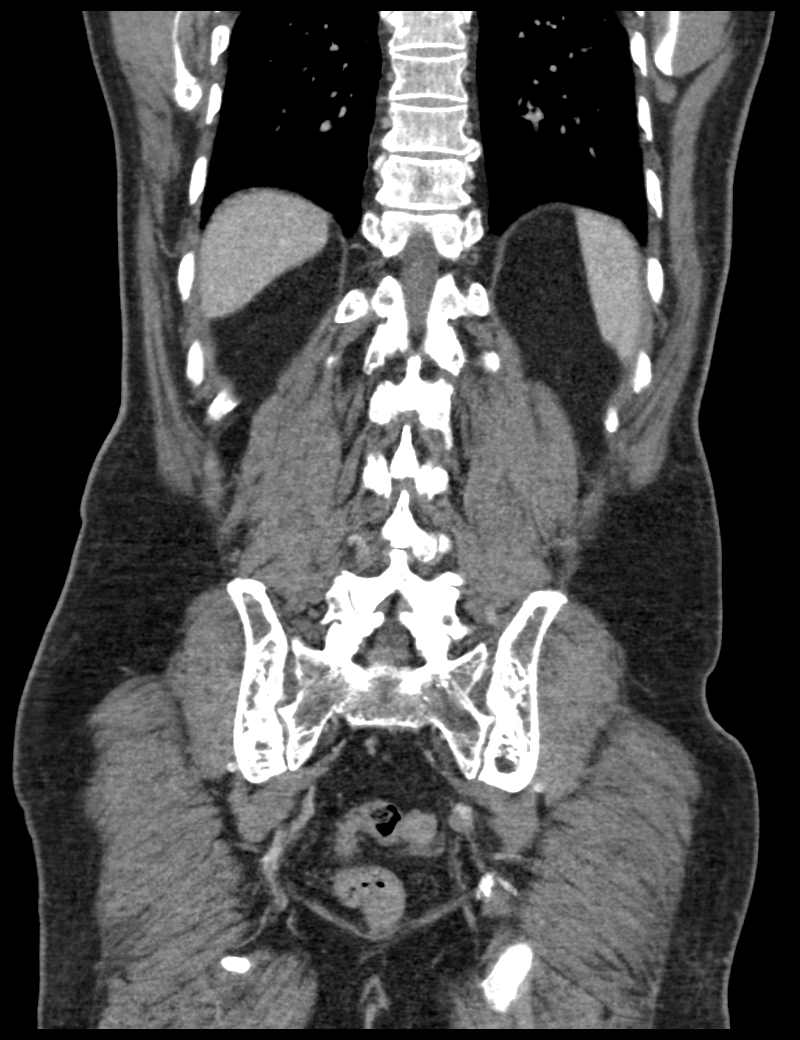

[Series 205: sagittals · sagittal · 0.50mm/px · 1 of 168 slices shown, 2 images]
[im 56/168  soft-tissue]
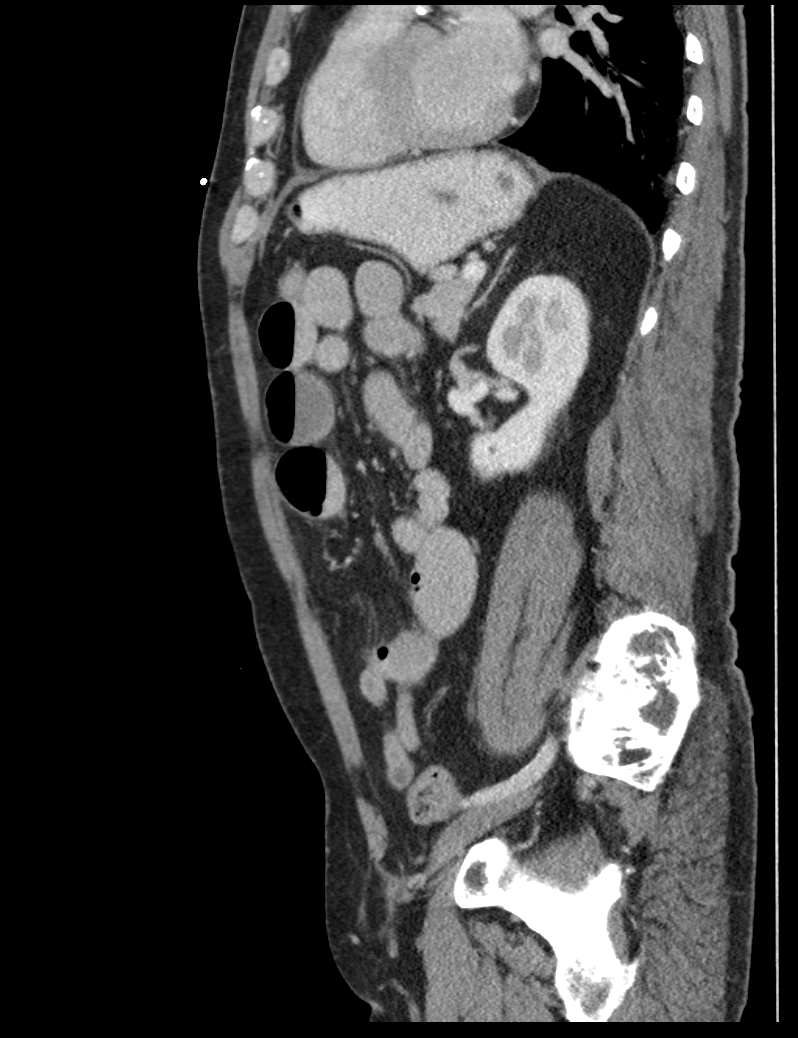
[im 56/168  bone]
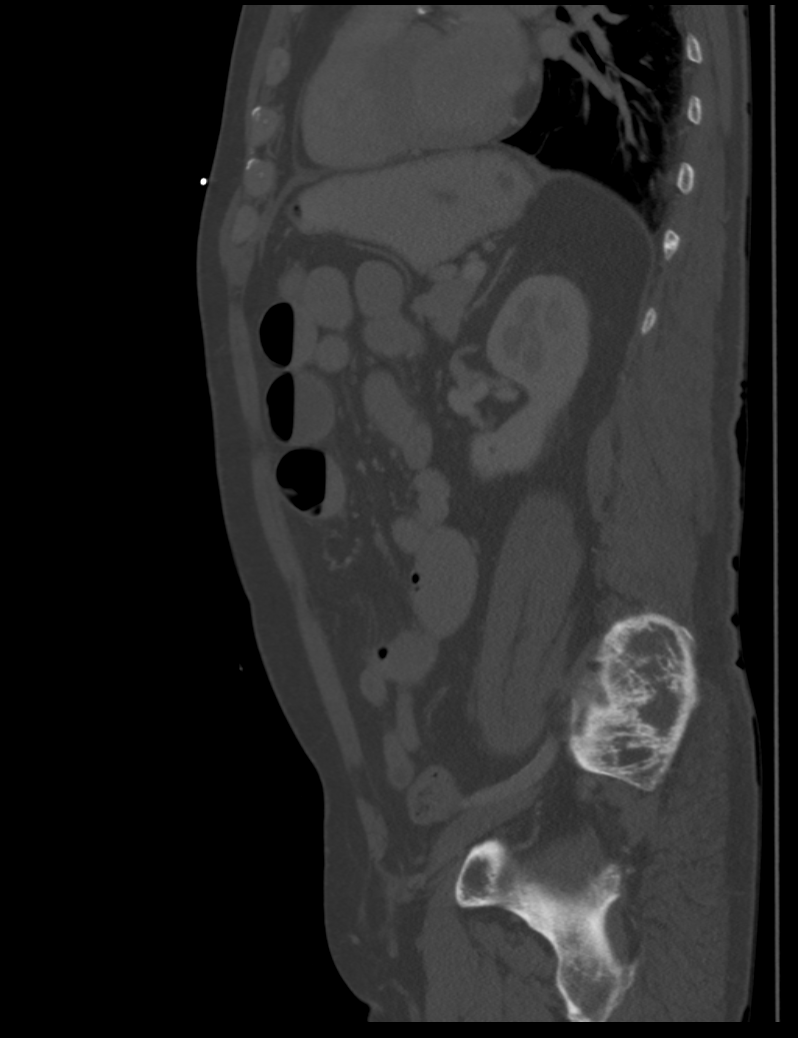

[4 of 46 positions shown; findings below may reference images not displayed]

FINDINGS: A lymph node is noted along the right major fissure, stable from
[G7]. Minimal bibasilar atelectasis is noted. Scattered coronary
artery calcification is noted.

There is mild dilatation of small bowel loops to 3.3 cm in maximal
diameter, with focal fecalization of the proximal to mid ileum at
the right mid abdomen. More distal loops are largely decompressed,
though a small amount of fluid and air is still seen within more
distal small bowel loops. This likely reflects partial small bowel
obstruction, due to an adhesion.

The ileocolic anastomosis is grossly unremarkable in appearance.
Trace residual stool is noted within the ascending colon, though it
is otherwise largely decompressed. A single diverticulum is noted at
the distal descending colon. The colon is otherwise unremarkable.

The liver and spleen are unremarkable in appearance. The patient is
status post cholecystectomy. The pancreas and adrenal glands are
unremarkable.

Mild nonspecific perinephric stranding is noted bilaterally. The
kidneys are otherwise unremarkable. There is no evidence of
hydronephrosis. No renal or ureteral stones are seen.

No free fluid is identified. The small bowel is unremarkable in
appearance. The stomach is within normal limits. No acute vascular
abnormalities are seen. Note is made of mild mesenteric inflammation
at the mid abdomen; this is stable from prior studies. Mild
scattered calcification is seen along the abdominal aorta and its
branches.

The bladder is mildly distended and grossly unremarkable. The
prostate is borderline enlarged, measuring 4.8 cm in transverse
dimension, with minimal calcification. A small left inguinal hernia
is seen, containing only fat. No inguinal lymphadenopathy is seen.

No acute osseous abnormalities are identified. There is diffuse
osteopenia of visualized osseous structures. There is minimal grade
1 anterolisthesis of L4 on L5, reflecting underlying facet disease.
IMPRESSION: 1. Mild dilatation of small-bowel loops to 3.3 cm in maximal
diameter, with focal fecalization of the proximal to mid ileum, at
the right mid abdomen. More distal loops are largely decompressed,
though a small amount of fluid and air is still seen within more
distal small bowel loops. This likely reflects partial small-bowel
obstruction due to an adhesion.
2. Ileocolic anastomosis is unremarkable in appearance.
3. Chronic mild nonspecific mesenteric inflammation noted at the mid
abdomen, stable in appearance.
4. Borderline enlarged prostate.
5. Small left inguinal hernia, containing only fat.
6. Diffuse osteopenia of visualized osseous structures.
7. Scattered coronary artery calcification noted.
8. Mild scattered calcification along the abdominal aorta and its
branches.

## 2013-12-29 MED ORDER — ONDANSETRON HCL 4 MG/2ML IJ SOLN
4.0000 mg | Freq: Once | INTRAMUSCULAR | Status: AC
  Administered 2013-12-29: 4 mg via INTRAVENOUS
  Filled 2013-12-29: qty 2

## 2013-12-29 MED ORDER — HYDROMORPHONE HCL 1 MG/ML IJ SOLN
1.0000 mg | Freq: Once | INTRAMUSCULAR | Status: AC
  Administered 2013-12-29: 1 mg via INTRAVENOUS
  Filled 2013-12-29: qty 1

## 2013-12-29 MED ORDER — ONDANSETRON HCL 4 MG/2ML IJ SOLN
4.0000 mg | Freq: Four times a day (QID) | INTRAMUSCULAR | Status: DC | PRN
  Administered 2013-12-29: 4 mg via INTRAVENOUS
  Filled 2013-12-29: qty 2

## 2013-12-29 MED ORDER — ENOXAPARIN SODIUM 40 MG/0.4ML ~~LOC~~ SOLN
40.0000 mg | SUBCUTANEOUS | Status: DC
  Administered 2013-12-29 – 2013-12-31 (×3): 40 mg via SUBCUTANEOUS
  Filled 2013-12-29 (×4): qty 0.4

## 2013-12-29 MED ORDER — ONDANSETRON HCL 4 MG/2ML IJ SOLN: 4.0000 mg | Freq: Three times a day (TID) | INTRAMUSCULAR | Status: DC | PRN

## 2013-12-29 MED ORDER — ONDANSETRON HCL 4 MG PO TABS: 4.0000 mg | ORAL_TABLET | Freq: Four times a day (QID) | ORAL | Status: DC | PRN

## 2013-12-29 MED ORDER — KETOROLAC TROMETHAMINE 30 MG/ML IJ SOLN
30.0000 mg | Freq: Four times a day (QID) | INTRAMUSCULAR | Status: DC | PRN
  Administered 2013-12-29 – 2013-12-30 (×2): 30 mg via INTRAVENOUS
  Filled 2013-12-29 (×2): qty 1

## 2013-12-29 MED ORDER — IOHEXOL 300 MG/ML  SOLN
100.0000 mL | Freq: Once | INTRAMUSCULAR | Status: AC | PRN
  Administered 2013-12-29: 100 mL via INTRAVENOUS

## 2013-12-29 MED ORDER — HYDROMORPHONE HCL 1 MG/ML IJ SOLN: 1.0000 mg | INTRAMUSCULAR | Status: DC | PRN

## 2013-12-29 MED ORDER — DEXTROSE-NACL 5-0.45 % IV SOLN
INTRAVENOUS | Status: DC
  Administered 2013-12-29 – 2013-12-31 (×3): via INTRAVENOUS

## 2013-12-29 MED ORDER — SODIUM CHLORIDE 0.9 % IV SOLN: INTRAVENOUS | Status: DC

## 2013-12-29 NOTE — ED Notes (Signed)
Pt. Has a hx of colon cancer had part of his bowel removed in 2004. Since then pt. Has episodes of n/v. On EMS arrival pt. Had vomited about 1000cc. IV was started and NS given. Pt. States he is feeling better on arrival to ED. Family states when these episodes occur pt. Usually has a blockage.

## 2013-12-29 NOTE — ED Provider Notes (Signed)
CSN: [id number]     Arrival date & time 12/29/13  0023 History   First MD Initiated Contact with Patient 12/29/13 0028     Chief Complaint  Patient presents with  . Nausea  . Emesis     (Consider location/radiation/quality/duration/timing/severity/associated sxs/prior Treatment) Patient is a 68 y.o. male presenting with vomiting. The history is provided by the patient, the spouse and a relative. A language interpreter was used Public house manager is family at bedside.).  Emesis Severity:  Mild Duration:  1 day Number of daily episodes:  1 Chronicity:  New Associated symptoms comment:  He has a history of colon tumor resection and subsequent obstructions presenting with vomiting. The family became concerned that he may be having another episode today. He denies pain. Last normal bowel movement    Past Medical History  Diagnosis Date  . Malignant neoplasm of ascending colon   . Iron deficiency anemia secondary to blood loss (chronic)   . Hx of adenomatous colonic polyps    Past Surgical History  Procedure Laterality Date  . Hemicolectomy  01/15/03  . Cholecystectomy  1991   Family History  Problem Relation Age of Onset  . Diabetes Mother   . Diabetes Brother    History  Substance Use Topics  . Smoking status: Never Smoker   . Smokeless tobacco: Never Used  . Alcohol Use: Yes     Comment: occassional    Review of Systems  Gastrointestinal: Positive for vomiting.      Allergies  Review of patient's allergies indicates no known allergies.  Home Medications   Prior to Admission medications   Medication Sig Start Date End Date Taking? Authorizing Provider  acetaminophen (TYLENOL) 325 MG tablet Take 2 tablets (650 mg total) by mouth every 6 (six) hours as needed. 04/25/12   Emina Riebock, NP  atorvastatin (LIPITOR) 20 MG tablet Take 1 tablet (20 mg total) by mouth daily. 11/29/13   Willeen Niece, MD  Cholecalciferol (VITAMIN D PO) Take by mouth.    Historical Provider, MD   fish oil-omega-3 fatty acids 1000 MG capsule Take 2 g by mouth daily.    Historical Provider, MD   BP 123/66 mmHg  Pulse 65  Temp(Src) 98.4 F (36.9 C) (Oral)  Resp 19  Ht 5\' 8"  (1.727 m)  Wt 183 lb (83.008 kg)  BMI 27.83 kg/m2  SpO2 99% Physical Exam  ED Course  Procedures (including critical care time) Labs Review Labs Reviewed  CBC WITH DIFFERENTIAL  COMPREHENSIVE METABOLIC PANEL  URINALYSIS, ROUTINE W REFLEX MICROSCOPIC  TROPONIN I    Imaging Review No results found.   EKG Interpretation None      MDM   Final diagnoses:  None    1. Partial small bowel obstruction   DDx - recurrent obstruction vs viral GE. The patient is well appearing, non-tender abdomen, NAD, VSS. Doubt obstruction. Will wait for lab studies and plain films.  During evaluation, the patient developed significant abdominal pain with nausea and vomiting. Pain is generalized. Medications ordered. Plain film raises concern for recurrent obstruction - CT scan ordered.   Results of CT, per radiology, c/w partial SBO. NG tube ordered. Patient comfortable at the present time. Discussed with [facility] resident who accepts for admission.    Dewaine Oats, PA-C 12/29/13 [id number]  Debby Freiberg, MD 12/30/13 [phone]

## 2013-12-29 NOTE — ED Notes (Signed)
Report attempted 

## 2013-12-29 NOTE — H&P (Signed)
[facility] Admission History and Physical Service Pager: [phone]  Patient name: Alan Riley Medical record number: [id number] Date of birth: 08/15/1945 Age: 68 y.o. Gender: male  Primary Care Provider: Willeen Niece, MD Consultants: None Code Status: Full  Chief Complaint: Abdominal pain and nausea  Assessment and Plan: Alan Riley is a 68 y.o. male presenting with partial small bowel obstruction. PMH is significant for colon cancer s/p resection in 2004, prior SBO, and HLD.  SBO. Abdominal CT reveals partial SBO due to adhesions likely related to prior abdominal surgery (s/p partial bowel resection in 2004 2/2 colon cancer). Patient had last SBO in 2014 and was admitted for 2-3 days. Abdominal exam is relatively benign. -NPO, NG tube placed with intermittent suction for bowel rest -Advance diet as tolerated.  -Will try to avoid narcotics  -Zofran prn nausea -Toradol prn pain -D5 1/2NS @ 100cc/hr while NPO -Consider surgery consult if abdominal exam acute worsens or clinical status does not improve  HLD. Chol 179, TG 231, HDL 30, LDL 103 -Hold home statin while NPO  History of colon cancer. S/p partial bowel resection and anastomosis in 2004. Currently in remission. -Planning to have colonoscopy next year for follow up  FEN/GI: NPO, D5 1/2NS @100cc /hr Prophylaxis: Lovenox  Disposition: Admitted to med-surg floor under attending Dr Lindell Noe pending improvement in abdominal pain and nausea and ability to tolerate PO  History of Present Illness: Alan Riley is a 68 y.o. male presenting with nausea and abdominal pain since yesterday evening.   Patient was in his usual state of health when he had a sudden onset of abdominal pain and nausea around 11:00pm yesterday evening. Pain was located primarily in his lower abdomen and rated as 9/10. Patient states that this pain was similar to his prior SBOs and thus presented to the ED for further  evaluation. Patient denies any vomiting. Last had a bowel movement yesterday morning with no hematochezia.   Patient has a history of recurrent SBOs, with the last occuring in 2014 for which he was admitted for 3 days. Patient has a history of colon cancer and had a partial bowel resection and anastomosis in 2004. Patient reports being clear of cancer since the procedure. He has a scheduled colonoscopy for next year. He is followed yearly by GI, surgery, oncology.  In the ED, patient had abdominal CT that was consistent with partial small bowel obstruction. Patient was given 2mg  total of dilaudid and 2 doses of zofran. NG tube was placed. Pain is much better and is now rated as 0/10.  Review Of Systems: Per HPI with the following additions, Otherwise 12 point review of systems was performed and was unremarkable.  Patient Active Problem List   Diagnosis Date Noted  . Pure hypercholesterolemia 11/29/2013  . Basal cell carcinoma of brow 11/29/2013  . Leg cramps 02/22/2013  . Small bowel obstruction 04/25/2012  . Nocturia 12/09/2011  . Impaired fasting glucose 12/09/2011  . Screening for prostate cancer 12/09/2011  . History of colon cancer, stage III 04/14/2011   Past Medical History: Past Medical History  Diagnosis Date  . Malignant neoplasm of ascending colon   . Iron deficiency anemia secondary to blood loss (chronic)   . Hx of adenomatous colonic polyps    Past Surgical History: Past Surgical History  Procedure Laterality Date  . Hemicolectomy  01/15/03  . Cholecystectomy  1991   Social History: History  Substance Use Topics  . Smoking status: Never Smoker   . Smokeless tobacco:  Never Used  . Alcohol Use: Yes     Comment: occassional   Additional social history: No alcohol in 10 years Please also refer to relevant sections of EMR.  Family History: Family History  Problem Relation Age of Onset  . Diabetes Mother   . Diabetes Brother    Allergies and Medications: No  Known Allergies No current facility-administered medications on file prior to encounter.   Current Outpatient Prescriptions on File Prior to Encounter  Medication Sig Dispense Refill  . acetaminophen (TYLENOL) 325 MG tablet Take 2 tablets (650 mg total) by mouth every 6 (six) hours as needed. 60 tablet 0  . atorvastatin (LIPITOR) 20 MG tablet Take 1 tablet (20 mg total) by mouth daily. 90 tablet 3  . Cholecalciferol (VITAMIN D PO) Take by mouth.    . fish oil-omega-3 fatty acids 1000 MG capsule Take 2 g by mouth daily.      Objective: BP 149/89 mmHg  Pulse 59  Temp(Src) 98.4 F (36.9 C) (Oral)  Resp 20  Ht 5\' 8"  (1.727 m)  Wt 183 lb (83.008 kg)  BMI 27.83 kg/m2  SpO2 98% Exam: General: Elderly man sitting in hospital bed in NAD, NG tube in place with brown-green material in tube HEENT: NG tube in place. NCAT, EOMI Cardiovascular: RRR, no murmurs appreciated Respiratory: NWOB, CTAB with no wheezes Abdomen: +BS, soft, nontender, nondistended. No rebound or guarding Extremities: No peripheral edema or cyanosis Skin: Hyperpigmented lesion noted on lateral edge of right eyebrow Neuro: Alert and conversational. No focal neurologic deficits.   Labs and Imaging: CBC BMET   Recent Labs Lab 12/29/13 0106  WBC 15.1*  HGB 14.3  HCT 42.2  PLT 144*    Recent Labs Lab 12/29/13 0106  NA 141  K 4.2  CL 103  CO2 26  BUN 13  CREATININE 0.87  GLUCOSE 138*  CALCIUM 9.0     Urinalysis    Component Value Date/Time   COLORURINE AMBER* 12/29/2013 0352   APPEARANCEUR CLOUDY* 12/29/2013 0352   LABSPEC 1.029 12/29/2013 0352   PHURINE 6.0 12/29/2013 0352   GLUCOSEU NEGATIVE 12/29/2013 0352   HGBUR NEGATIVE 12/29/2013 0352   BILIRUBINUR NEGATIVE 12/29/2013 0352   BILIRUBINUR NEG 12/09/2011 1009   KETONESUR 15* 12/29/2013 0352   PROTEINUR NEGATIVE 12/29/2013 0352   PROTEINUR NEG 12/09/2011 1009   UROBILINOGEN 0.2 12/29/2013 0352   UROBILINOGEN 0.2 12/09/2011 1009   NITRITE  NEGATIVE 12/29/2013 0352   NITRITE NEG 12/09/2011 1009   LEUKOCYTESUR NEGATIVE 12/29/2013 0352     Ct Abdomen Pelvis W Contrast  12/29/2013   CLINICAL DATA:  Acute onset of nausea and vomiting. Initial encounter.  EXAM: CT ABDOMEN AND PELVIS WITH CONTRAST  TECHNIQUE: Multidetector CT imaging of the abdomen and pelvis was performed using the standard protocol following bolus administration of intravenous contrast.  CONTRAST:  142mL OMNIPAQUE IOHEXOL 300 MG/ML  SOLN  COMPARISON:  CT of the abdomen and pelvis performed 04/21/2012  FINDINGS: A lymph node is noted along the right major fissure, stable from 2011. Minimal bibasilar atelectasis is noted. Scattered coronary artery calcification is noted.  There is mild dilatation of small bowel loops to 3.3 cm in maximal diameter, with focal fecalization of the proximal to mid ileum at the right mid abdomen. More distal loops are largely decompressed, though a small amount of fluid and air is still seen within more distal small bowel loops. This likely reflects partial small bowel obstruction, due to an adhesion.  The ileocolic anastomosis is  grossly unremarkable in appearance. Trace residual stool is noted within the ascending colon, though it is otherwise largely decompressed. A single diverticulum is noted at the distal descending colon. The colon is otherwise unremarkable.  The liver and spleen are unremarkable in appearance. The patient is status post cholecystectomy. The pancreas and adrenal glands are unremarkable.  Mild nonspecific perinephric stranding is noted bilaterally. The kidneys are otherwise unremarkable. There is no evidence of hydronephrosis. No renal or ureteral stones are seen.  No free fluid is identified. The small bowel is unremarkable in appearance. The stomach is within normal limits. No acute vascular abnormalities are seen. Note is made of mild mesenteric inflammation at the mid abdomen; this is stable from prior studies. Mild scattered  calcification is seen along the abdominal aorta and its branches.  The bladder is mildly distended and grossly unremarkable. The prostate is borderline enlarged, measuring 4.8 cm in transverse dimension, with minimal calcification. A small left inguinal hernia is seen, containing only fat. No inguinal lymphadenopathy is seen.  No acute osseous abnormalities are identified. There is diffuse osteopenia of visualized osseous structures. There is minimal grade 1 anterolisthesis of L4 on L5, reflecting underlying facet disease.  IMPRESSION: 1. Mild dilatation of small-bowel loops to 3.3 cm in maximal diameter, with focal fecalization of the proximal to mid ileum, at the right mid abdomen. More distal loops are largely decompressed, though a small amount of fluid and air is still seen within more distal small bowel loops. This likely reflects partial small-bowel obstruction due to an adhesion. 2. Ileocolic anastomosis is unremarkable in appearance. 3. Chronic mild nonspecific mesenteric inflammation noted at the mid abdomen, stable in appearance. 4. Borderline enlarged prostate. 5. Small left inguinal hernia, containing only fat. 6. Diffuse osteopenia of visualized osseous structures. 7. Scattered coronary artery calcification noted. 8. Mild scattered calcification along the abdominal aorta and its branches.   Electronically Signed   By: Garald Balding M.D.   On: 12/29/2013 05:57   Dg Abd Acute W/chest  12/29/2013   CLINICAL DATA:  Acute onset of generalized abdominal pain and vomiting. Initial encounter.  EXAM: ACUTE ABDOMEN SERIES (ABDOMEN 2 VIEW & CHEST 1 VIEW)  COMPARISON:  Abdominal radiograph performed 04/23/2012, and chest radiograph performed 04/21/2012  FINDINGS: The lungs are well-aerated. Mild peribronchial thickening is noted. Mild bibasilar airspace opacities likely reflect atelectasis. There is no evidence of pleural effusion or pneumothorax. The cardiomediastinal silhouette is borderline enlarged.  The  visualized bowel gas pattern is nonspecific. There is distention of small-bowel loops to 3.9 cm in maximal diameter, without definite evidence for bowel obstruction. Stool is still seen in the colon. No free intra-abdominal air is identified on the provided upright view.  No acute osseous abnormalities are seen; the sacroiliac joints are unremarkable in appearance.  IMPRESSION: 1. Nonspecific bowel gas pattern; distention of small-bowel loops to 3.9 cm in maximal diameter, with stool still seen in the colon. This could reflect mild ileus, although partial small bowel obstruction might have a similar appearance. No free intra-abdominal air seen. 2. Mild bibasilar airspace opacities likely reflect atelectasis; mild peribronchial thickening noted. Borderline cardiomegaly.   Electronically Signed   By: Garald Balding M.D.   On: 12/29/2013 02:25    Dimas Chyle, MD 12/29/2013, 6:01 AM PGY-1, [place] Intern pager: [phone], text pages welcome  I have seen and examined the patient. I have read and agree with the above note. My changes are noted in blue.  Tawanna Sat, MD 12/29/2013,  9:05 AM PGY-2, [place] Intern Pager: [phone], text pages welcome

## 2013-12-29 NOTE — ED Notes (Signed)
Patient transported to CT 

## 2013-12-29 NOTE — Consult Note (Signed)
Reason for Consult:  Recurrent SBO Referring Physician: Dr. Tawanna Sat, Family Medicine Teaching Service  Alan Riley is an 68 y.o. male.  HPI:  Pt with hx of right colon cancer with right hemicolectomy 01/15/03 by Dr. Marlou Starks.  He presents with sudden onset of abdominal pain yesterday about 11 AM.  He was fine till then and reports normal BM early Am yesterday also. He has had this before, and from what I can see hospitalized on 12/24/2009 and 3/29-04/25/2012 with SBO's.  Each has resolved with bowel rest and hydration Work up in the ED shows; SBO with loops up to 3.9cm, stool in the colon.  CT scan confirms SB dilitation up to 3.3 cm focal fecalization of the proximal to mid ileum, at the right mid abdomen. More distal loops are largely decompressed, though a small amount of fluid and air is still seen within more distal small bowel loops. This likely reflects partial small-bowel obstruction due to an adhesion. 2. Ileocolic anastomosis is unremarkable in appearance. 3. Chronic mild nonspecific mesenteric inflammation noted at the mid abdomen, stable in appearance. 4. Borderline enlarged prostate. 5. Small left inguinal hernia, containing only fat. 6. Diffuse osteopenia of visualized osseous structures. 7. Scattered coronary artery calcification noted.  He has been admitted by Medicine and NG placed, we are ask to see in consult.  He is afebrile, hemodynamically stable.  Labs show an elevated glucose and WBC 15.1K.   Past Medical History  Diagnosis Date  . Malignant neoplasm of ascending colon Moderately differentiated near obstructing adenocarcinoma involving  the ascending colon with 6/14 positive lymph nodes, T3 N2, stage  IIIC, status post hemicolectomy on 01/15/2003. Adjuvant  chemotherapy with FOLFOX 12 cycles were administered from  03/04/2003 through August 2005   . Iron deficiency anemia secondary to blood loss (chronic)    Just started on Medicine for  hyperlipidemia   . Hx of adenomatous colonic polyps     Past Surgical History  Procedure Laterality Date  . Right  Hemicolectomy, 01/15/2003, Dr. Marlou Starks  01/15/03  . Cholecystectomy  1991    Family History  Problem Relation Age of Onset  . Diabetes Mother   . Diabetes Brother     Social History:  reports that he has never smoked. He has never used smokeless tobacco. He reports that he drinks alcohol. He reports that he does not use illicit drugs.  Allergies: No Known Allergies  Medications:  Prior to Admission:  Prescriptions prior to admission  Medication Sig Dispense Refill Last Dose  . acetaminophen (TYLENOL) 325 MG tablet Take 2 tablets (650 mg total) by mouth every 6 (six) hours as needed. 60 tablet 0 unknown  . atorvastatin (LIPITOR) 20 MG tablet Take 1 tablet (20 mg total) by mouth daily. 90 tablet 3 12/28/2013 at Unknown time  . Cholecalciferol (VITAMIN D PO) Take by mouth.   12/28/2013 at Unknown time  . fish oil-omega-3 fatty acids 1000 MG capsule Take 2 g by mouth daily.   12/28/2013 at Unknown time   Scheduled: . enoxaparin (LOVENOX) injection  40 mg Subcutaneous Q24H   Continuous: . dextrose 5 % and 0.45% NaCl 100 mL/hr at 12/29/13 1194   RDE:YCXKGYJEH, ondansetron **OR** ondansetron (ZOFRAN) IV Anti-infectives    None      Results for orders placed or performed during the hospital encounter of 12/29/13 (from the past 48 hour(s))  CBC with Differential     Status: Abnormal   Collection Time: 12/29/13  1:06 AM  Result Value Ref Range  WBC 15.1 (H) 4.0 - 10.5 K/uL   RBC 4.41 4.22 - 5.81 MIL/uL   Hemoglobin 14.3 13.0 - 17.0 g/dL   HCT 42.2 39.0 - 52.0 %   MCV 95.7 78.0 - 100.0 fL   MCH 32.4 26.0 - 34.0 pg   MCHC 33.9 30.0 - 36.0 g/dL   RDW 12.5 11.5 - 15.5 %   Platelets 144 (L) 150 - 400 K/uL   Neutrophils Relative % 88 (H) 43 - 77 %   Neutro Abs 13.3 (H) 1.7 - 7.7 K/uL   Lymphocytes Relative 5 (L) 12 - 46 %   Lymphs Abs 0.7 0.7 - 4.0 K/uL   Monocytes  Relative 7 3 - 12 %   Monocytes Absolute 1.1 (H) 0.1 - 1.0 K/uL   Eosinophils Relative 0 0 - 5 %   Eosinophils Absolute 0.0 0.0 - 0.7 K/uL   Basophils Relative 0 0 - 1 %   Basophils Absolute 0.0 0.0 - 0.1 K/uL  Comprehensive metabolic panel     Status: Abnormal   Collection Time: 12/29/13  1:06 AM  Result Value Ref Range   Sodium 141 137 - 147 mEq/L   Potassium 4.2 3.7 - 5.3 mEq/L   Chloride 103 96 - 112 mEq/L   CO2 26 19 - 32 mEq/L   Glucose, Bld 138 (H) 70 - 99 mg/dL   BUN 13 6 - 23 mg/dL   Creatinine, Ser 0.87 0.50 - 1.35 mg/dL   Calcium 9.0 8.4 - 10.5 mg/dL   Total Protein 6.9 6.0 - 8.3 g/dL   Albumin 4.0 3.5 - 5.2 g/dL   AST 20 0 - 37 U/L   ALT 19 0 - 53 U/L   Alkaline Phosphatase 52 39 - 117 U/L   Total Bilirubin 0.7 0.3 - 1.2 mg/dL   GFR calc non Af Amer 87 (L) >90 mL/min   GFR calc Af Amer >90 >90 mL/min    Comment: (NOTE) The eGFR has been calculated using the CKD EPI equation. This calculation has not been validated in all clinical situations. eGFR's persistently <90 mL/min signify possible Chronic Kidney Disease.    Anion gap 12 5 - 15  Troponin I     Status: None   Collection Time: 12/29/13  1:06 AM  Result Value Ref Range   Troponin I <0.30 <0.30 ng/mL    Comment:        Due to the release kinetics of cTnI, a negative result within the first hours of the onset of symptoms does not rule out myocardial infarction with certainty. If myocardial infarction is still suspected, repeat the test at appropriate intervals.   Urinalysis, Routine w reflex microscopic     Status: Abnormal   Collection Time: 12/29/13  3:52 AM  Result Value Ref Range   Color, Urine AMBER (A) YELLOW    Comment: BIOCHEMICALS MAY BE AFFECTED BY COLOR   APPearance CLOUDY (A) CLEAR   Specific Gravity, Urine 1.029 1.005 - 1.030   pH 6.0 5.0 - 8.0   Glucose, UA NEGATIVE NEGATIVE mg/dL   Hgb urine dipstick NEGATIVE NEGATIVE   Bilirubin Urine NEGATIVE NEGATIVE   Ketones, ur 15 (A) NEGATIVE  mg/dL   Protein, ur NEGATIVE NEGATIVE mg/dL   Urobilinogen, UA 0.2 0.0 - 1.0 mg/dL   Nitrite NEGATIVE NEGATIVE   Leukocytes, UA NEGATIVE NEGATIVE    Comment: MICROSCOPIC NOT DONE ON URINES WITH NEGATIVE PROTEIN, BLOOD, LEUKOCYTES, NITRITE, OR GLUCOSE <1000 mg/dL.    Ct Abdomen Pelvis W Contrast  12/29/2013   CLINICAL DATA:  Acute onset of nausea and vomiting. Initial encounter.  EXAM: CT ABDOMEN AND PELVIS WITH CONTRAST  TECHNIQUE: Multidetector CT imaging of the abdomen and pelvis was performed using the standard protocol following bolus administration of intravenous contrast.  CONTRAST:  184m OMNIPAQUE IOHEXOL 300 MG/ML  SOLN  COMPARISON:  CT of the abdomen and pelvis performed 04/21/2012  FINDINGS: A lymph node is noted along the right major fissure, stable from 2011. Minimal bibasilar atelectasis is noted. Scattered coronary artery calcification is noted.  There is mild dilatation of small bowel loops to 3.3 cm in maximal diameter, with focal fecalization of the proximal to mid ileum at the right mid abdomen. More distal loops are largely decompressed, though a small amount of fluid and air is still seen within more distal small bowel loops. This likely reflects partial small bowel obstruction, due to an adhesion.  The ileocolic anastomosis is grossly unremarkable in appearance. Trace residual stool is noted within the ascending colon, though it is otherwise largely decompressed. A single diverticulum is noted at the distal descending colon. The colon is otherwise unremarkable.  The liver and spleen are unremarkable in appearance. The patient is status post cholecystectomy. The pancreas and adrenal glands are unremarkable.  Mild nonspecific perinephric stranding is noted bilaterally. The kidneys are otherwise unremarkable. There is no evidence of hydronephrosis. No renal or ureteral stones are seen.  No free fluid is identified. The small bowel is unremarkable in appearance. The stomach is within  normal limits. No acute vascular abnormalities are seen. Note is made of mild mesenteric inflammation at the mid abdomen; this is stable from prior studies. Mild scattered calcification is seen along the abdominal aorta and its branches.  The bladder is mildly distended and grossly unremarkable. The prostate is borderline enlarged, measuring 4.8 cm in transverse dimension, with minimal calcification. A small left inguinal hernia is seen, containing only fat. No inguinal lymphadenopathy is seen.  No acute osseous abnormalities are identified. There is diffuse osteopenia of visualized osseous structures. There is minimal grade 1 anterolisthesis of L4 on L5, reflecting underlying facet disease.  IMPRESSION: 1. Mild dilatation of small-bowel loops to 3.3 cm in maximal diameter, with focal fecalization of the proximal to mid ileum, at the right mid abdomen. More distal loops are largely decompressed, though a small amount of fluid and air is still seen within more distal small bowel loops. This likely reflects partial small-bowel obstruction due to an adhesion. 2. Ileocolic anastomosis is unremarkable in appearance. 3. Chronic mild nonspecific mesenteric inflammation noted at the mid abdomen, stable in appearance. 4. Borderline enlarged prostate. 5. Small left inguinal hernia, containing only fat. 6. Diffuse osteopenia of visualized osseous structures. 7. Scattered coronary artery calcification noted. 8. Mild scattered calcification along the abdominal aorta and its branches.   Electronically Signed   By: JGarald BaldingM.D.   On: 12/29/2013 05:57   Dg Abd Acute W/chest  12/29/2013   CLINICAL DATA:  Acute onset of generalized abdominal pain and vomiting. Initial encounter.  EXAM: ACUTE ABDOMEN SERIES (ABDOMEN 2 VIEW & CHEST 1 VIEW)  COMPARISON:  Abdominal radiograph performed 04/23/2012, and chest radiograph performed 04/21/2012  FINDINGS: The lungs are well-aerated. Mild peribronchial thickening is noted. Mild  bibasilar airspace opacities likely reflect atelectasis. There is no evidence of pleural effusion or pneumothorax. The cardiomediastinal silhouette is borderline enlarged.  The visualized bowel gas pattern is nonspecific. There is distention of small-bowel loops to 3.9 cm in maximal diameter, without definite  evidence for bowel obstruction. Stool is still seen in the colon. No free intra-abdominal air is identified on the provided upright view.  No acute osseous abnormalities are seen; the sacroiliac joints are unremarkable in appearance.  IMPRESSION: 1. Nonspecific bowel gas pattern; distention of small-bowel loops to 3.9 cm in maximal diameter, with stool still seen in the colon. This could reflect mild ileus, although partial small bowel obstruction might have a similar appearance. No free intra-abdominal air seen. 2. Mild bibasilar airspace opacities likely reflect atelectasis; mild peribronchial thickening noted. Borderline cardiomegaly.   Electronically Signed   By: Garald Balding M.D.   On: 12/29/2013 02:25    Review of Systems  Constitutional: Negative.   HENT: Negative.   Eyes: Negative.   Respiratory: Negative.  Negative for cough.   Cardiovascular: Negative.   Gastrointestinal: Positive for nausea, vomiting and abdominal pain.  Genitourinary: Negative.   Musculoskeletal: Negative.   Skin: Negative.   Neurological: Negative.   Psychiatric/Behavioral: Negative.    Blood pressure 132/65, pulse 60, temperature 98.8 F (37.1 C), temperature source Oral, resp. rate 18, height 5' 8"  (1.727 m), weight 83.008 kg (183 lb), SpO2 97 %. Physical Exam  Constitutional: He is oriented to person, place, and time. He appears well-developed and well-nourished. No distress.  Pain resolved with pain medicine and NG decompression  HENT:  Head: Normocephalic and atraumatic.  NG in place  Eyes: Conjunctivae and EOM are normal. Pupils are equal, round, and reactive to light. Right eye exhibits no  discharge. Left eye exhibits no discharge.  Neck: Normal range of motion. Neck supple. No JVD present. No tracheal deviation present. No thyromegaly present.  Cardiovascular: Normal rate, regular rhythm, normal heart sounds and intact distal pulses.   No murmur heard. Respiratory: Effort normal and breath sounds normal. No stridor. No respiratory distress. He has no wheezes. He has no rales. He exhibits no tenderness.  GI: Soft. He exhibits no mass. There is no tenderness. There is no rebound and no guarding.  BS still hyperactive  Musculoskeletal: He exhibits no edema.  Neurological: He is alert and oriented to person, place, and time. No cranial nerve deficit.  Skin: Skin is warm and dry. No rash noted. He is not diaphoretic. No erythema. No pallor.  Psychiatric: He has a normal mood and affect. His behavior is normal. Judgment and thought content normal.    Assessment/Plan: 1.  SBO with Right hemicolectomy 12/2002 2.  Prior SBO 2011 and 2014 3.  Moderately differentiated near obstructing adenocarcinoma involving  the ascending colon with 6/14 positive lymph nodes, T3 N2, stage  IIIC, status post hemicolectomy on 01/15/2003. Adjuvant  chemotherapy with FOLFOX 12 cycles were administered from  03/04/2003 through August 2005 4.   Dyslipidemia  Plan:  Agree with bowel rest, NG decompression, and hydration.  We will recheck film in AM.  Sendy Pluta 12/29/2013, 1:43 PM

## 2013-12-29 NOTE — ED Notes (Signed)
The patient is unable to give an urine specimen at this time. The RN is aware. 

## 2013-12-29 NOTE — Progress Notes (Signed)
Utilization Review Completed.   Dub Maclellan, RN, BSN Nurse Case Manager  

## 2013-12-30 ENCOUNTER — Inpatient Hospital Stay (HOSPITAL_COMMUNITY): Payer: No Typology Code available for payment source

## 2013-12-30 DIAGNOSIS — K5669 Other intestinal obstruction: Principal | ICD-10-CM

## 2013-12-30 LAB — BASIC METABOLIC PANEL
Anion gap: 11 (ref 5–15)
BUN: 10 mg/dL (ref 6–23)
CALCIUM: 8.5 mg/dL (ref 8.4–10.5)
CO2: 27 mEq/L (ref 19–32)
CREATININE: 0.75 mg/dL (ref 0.50–1.35)
Chloride: 104 mEq/L (ref 96–112)
Glucose, Bld: 111 mg/dL — ABNORMAL HIGH (ref 70–99)
Potassium: 3.6 mEq/L — ABNORMAL LOW (ref 3.7–5.3)
Sodium: 142 mEq/L (ref 137–147)

## 2013-12-30 LAB — CBC
HCT: 40.9 % (ref 39.0–52.0)
Hemoglobin: 13.7 g/dL (ref 13.0–17.0)
MCH: 32.2 pg (ref 26.0–34.0)
MCHC: 33.5 g/dL (ref 30.0–36.0)
MCV: 96.2 fL (ref 78.0–100.0)
Platelets: 144 10*3/uL — ABNORMAL LOW (ref 150–400)
RBC: 4.25 MIL/uL (ref 4.22–5.81)
RDW: 12.7 % (ref 11.5–15.5)
WBC: 6.4 10*3/uL (ref 4.0–10.5)

## 2013-12-30 IMAGING — DX DG ABDOMEN 2V
2 series · 2 of 2 positions shown · non-contrast
Comparison: [DATE] CT.

CLINICAL DATA: 68-year-old male with malignant neoplasm of the
colon. Small bowel obstruction follow-up. Subsequent encounter.

EXAM:
ABDOMEN - 2 VIEW

[abdomen erect]
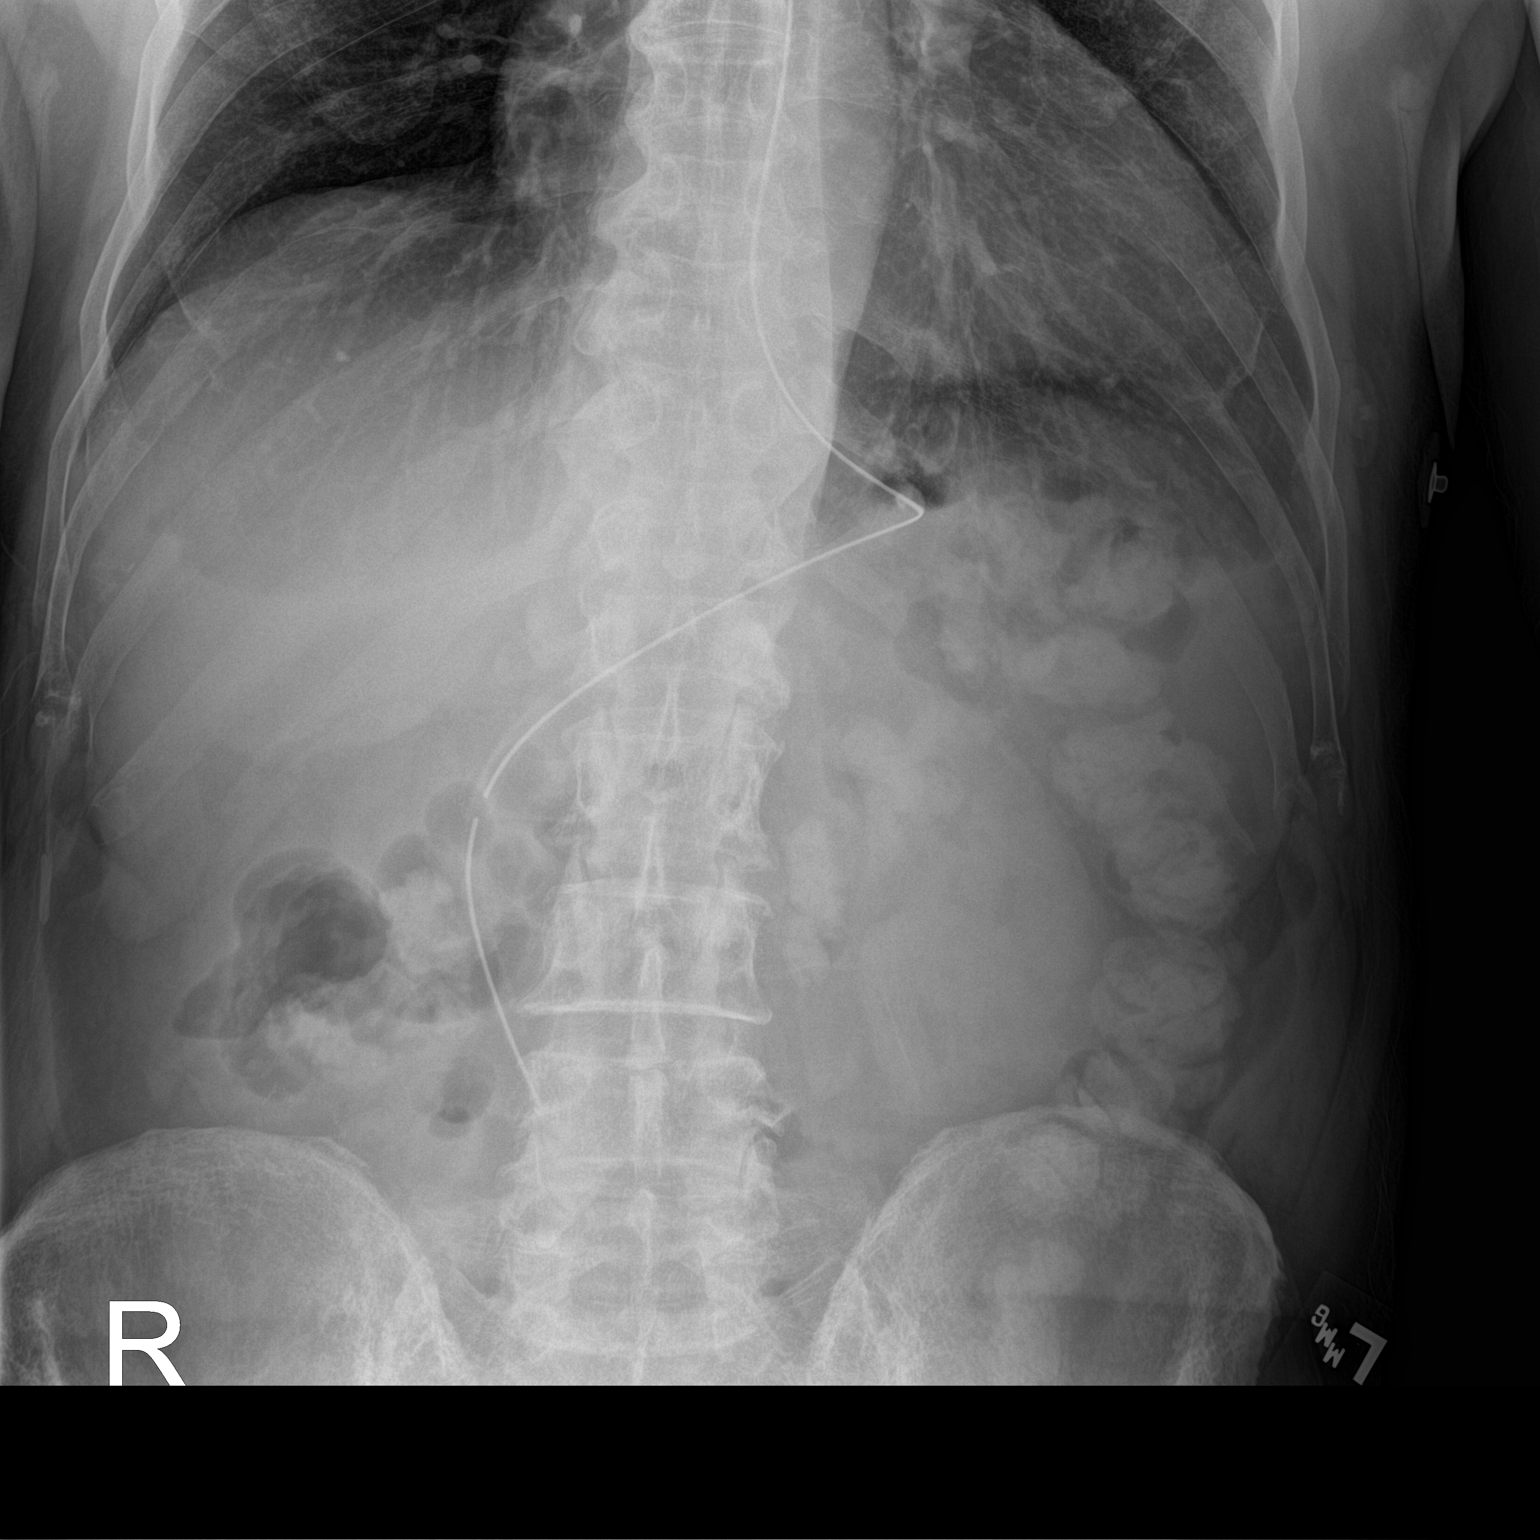

[abdomen supine]
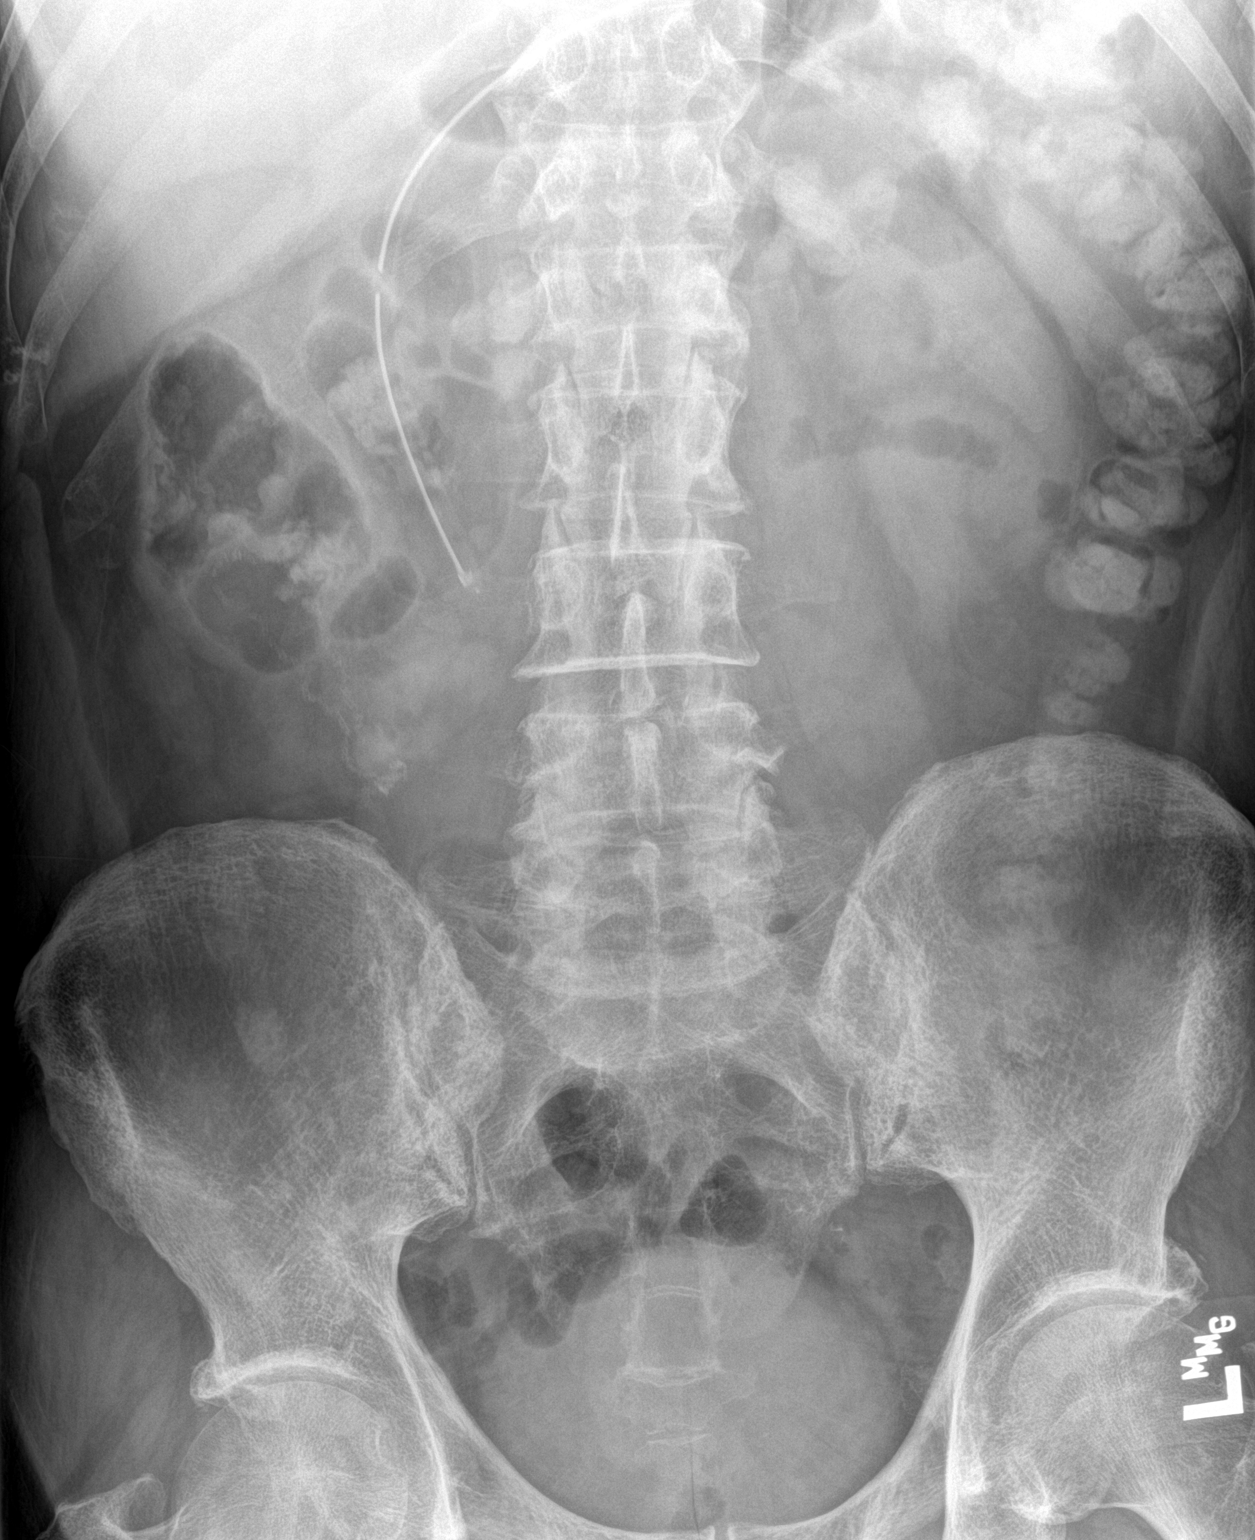

[2 of 2 positions shown; findings below may reference images not displayed]

FINDINGS: Nasogastric tube has been placed. The tip projects over the right
lower quadrant of the abdomen which I suspect is in the region of
the distal descending portion of the duodenum.

Decompression of previously noted small bowel obstructive pattern.
Currently, no gas distended small bowel loops are detected.
Fluid-filled loops of bowel may not be detected by plain film
examination.

No free intraperitoneal air detected.
IMPRESSION: Placement of nasogastric tube with decompression of recently noted
small bowel obstructive pattern.

## 2013-12-30 NOTE — Progress Notes (Signed)
Patient ID: Alan Riley, male   DOB: 1945-05-15, 68 y.o.   MRN: [id number]    Subjective: Pt feels well.  Passing flatus.  No BM  Objective: Vital signs in last 24 hours: Temp:  [98.5 F (36.9 C)-98.8 F (37.1 C)] 98.7 F (37.1 C) (12/07 0800) Pulse Rate:  [57-68] 65 (12/07 0800) Resp:  [14-18] 14 (12/07 0800) BP: (120-133)/(57-61) 123/57 mmHg (12/07 0800) SpO2:  [96 %-98 %] 97 % (12/07 0800) Last BM Date: 12/28/13  Intake/Output from previous day: 12/06 0701 - 12/07 0700 In: -  Out: 1500 [Urine:550; Emesis/NG output:950] Intake/Output this shift:    PE: Abd: soft, NT, ND, +BS, NGT with minimal output  Lab Results:   Recent Labs  12/29/13 0106 12/30/13 0659  WBC 15.1* 6.4  HGB 14.3 13.7  HCT 42.2 40.9  PLT 144* 144*   BMET  Recent Labs  12/29/13 0106 12/30/13 0659  NA 141 142  K 4.2 3.6*  CL 103 104  CO2 26 27  GLUCOSE 138* 111*  BUN 13 10  CREATININE 0.87 0.75  CALCIUM 9.0 8.5   PT/INR No results for input(s): LABPROT, INR in the last 72 hours. CMP     Component Value Date/Time   NA 142 12/30/2013 0659   NA 139 01/29/2013 1433   K 3.6* 12/30/2013 0659   K 3.7 01/29/2013 1433   CL 104 12/30/2013 0659   CO2 27 12/30/2013 0659   CO2 26 01/29/2013 1433   GLUCOSE 111* 12/30/2013 0659   GLUCOSE 110 01/29/2013 1433   BUN 10 12/30/2013 0659   BUN 9.5 01/29/2013 1433   CREATININE 0.75 12/30/2013 0659   CREATININE 0.77 11/29/2013 0952   CREATININE 0.8 01/29/2013 1433   CALCIUM 8.5 12/30/2013 0659   CALCIUM 9.2 01/29/2013 1433   PROT 6.9 12/29/2013 0106   PROT 7.4 01/29/2013 1433   ALBUMIN 4.0 12/29/2013 0106   ALBUMIN 4.3 01/29/2013 1433   AST 20 12/29/2013 0106   AST 16 01/29/2013 1433   ALT 19 12/29/2013 0106   ALT 21 01/29/2013 1433   ALKPHOS 52 12/29/2013 0106   ALKPHOS 56 01/29/2013 1433   BILITOT 0.7 12/29/2013 0106   BILITOT 0.71 01/29/2013 1433   GFRNONAA >90 12/30/2013 0659   GFRAA >90 12/30/2013 0659   Lipase     Component  Value Date/Time   LIPASE 45 04/21/2012 1759       Studies/Results: Ct Abdomen Pelvis W Contrast  12/29/2013   CLINICAL DATA:  Acute onset of nausea and vomiting. Initial encounter.  EXAM: CT ABDOMEN AND PELVIS WITH CONTRAST  TECHNIQUE: Multidetector CT imaging of the abdomen and pelvis was performed using the standard protocol following bolus administration of intravenous contrast.  CONTRAST:  12mL OMNIPAQUE IOHEXOL 300 MG/ML  SOLN  COMPARISON:  CT of the abdomen and pelvis performed 04/21/2012  FINDINGS: A lymph node is noted along the right major fissure, stable from 2011. Minimal bibasilar atelectasis is noted. Scattered coronary artery calcification is noted.  There is mild dilatation of small bowel loops to 3.3 cm in maximal diameter, with focal fecalization of the proximal to mid ileum at the right mid abdomen. More distal loops are largely decompressed, though a small amount of fluid and air is still seen within more distal small bowel loops. This likely reflects partial small bowel obstruction, due to an adhesion.  The ileocolic anastomosis is grossly unremarkable in appearance. Trace residual stool is noted within the ascending colon, though it is otherwise largely decompressed. A single  diverticulum is noted at the distal descending colon. The colon is otherwise unremarkable.  The liver and spleen are unremarkable in appearance. The patient is status post cholecystectomy. The pancreas and adrenal glands are unremarkable.  Mild nonspecific perinephric stranding is noted bilaterally. The kidneys are otherwise unremarkable. There is no evidence of hydronephrosis. No renal or ureteral stones are seen.  No free fluid is identified. The small bowel is unremarkable in appearance. The stomach is within normal limits. No acute vascular abnormalities are seen. Note is made of mild mesenteric inflammation at the mid abdomen; this is stable from prior studies. Mild scattered calcification is seen along the  abdominal aorta and its branches.  The bladder is mildly distended and grossly unremarkable. The prostate is borderline enlarged, measuring 4.8 cm in transverse dimension, with minimal calcification. A small left inguinal hernia is seen, containing only fat. No inguinal lymphadenopathy is seen.  No acute osseous abnormalities are identified. There is diffuse osteopenia of visualized osseous structures. There is minimal grade 1 anterolisthesis of L4 on L5, reflecting underlying facet disease.  IMPRESSION: 1. Mild dilatation of small-bowel loops to 3.3 cm in maximal diameter, with focal fecalization of the proximal to mid ileum, at the right mid abdomen. More distal loops are largely decompressed, though a small amount of fluid and air is still seen within more distal small bowel loops. This likely reflects partial small-bowel obstruction due to an adhesion. 2. Ileocolic anastomosis is unremarkable in appearance. 3. Chronic mild nonspecific mesenteric inflammation noted at the mid abdomen, stable in appearance. 4. Borderline enlarged prostate. 5. Small left inguinal hernia, containing only fat. 6. Diffuse osteopenia of visualized osseous structures. 7. Scattered coronary artery calcification noted. 8. Mild scattered calcification along the abdominal aorta and its branches.   Electronically Signed   By: Garald Balding M.D.   On: 12/29/2013 05:57   Dg Abd 2 Views  12/30/2013   CLINICAL DATA:  68 year old male with malignant neoplasm of the colon. Small bowel obstruction follow-up. Subsequent encounter.  EXAM: ABDOMEN - 2 VIEW  COMPARISON:  12/29/2013 CT.  FINDINGS: Nasogastric tube has been placed. The tip projects over the right lower quadrant of the abdomen which I suspect is in the region of the distal descending portion of the duodenum.  Decompression of previously noted small bowel obstructive pattern. Currently, no gas distended small bowel loops are detected. Fluid-filled loops of bowel may not be detected by  plain film examination.  No free intraperitoneal air detected.  IMPRESSION: Placement of nasogastric tube with decompression of recently noted small bowel obstructive pattern.   Electronically Signed   By: Chauncey Cruel M.D.   On: 12/30/2013 07:48   Dg Abd Acute W/chest  12/29/2013   CLINICAL DATA:  Acute onset of generalized abdominal pain and vomiting. Initial encounter.  EXAM: ACUTE ABDOMEN SERIES (ABDOMEN 2 VIEW & CHEST 1 VIEW)  COMPARISON:  Abdominal radiograph performed 04/23/2012, and chest radiograph performed 04/21/2012  FINDINGS: The lungs are well-aerated. Mild peribronchial thickening is noted. Mild bibasilar airspace opacities likely reflect atelectasis. There is no evidence of pleural effusion or pneumothorax. The cardiomediastinal silhouette is borderline enlarged.  The visualized bowel gas pattern is nonspecific. There is distention of small-bowel loops to 3.9 cm in maximal diameter, without definite evidence for bowel obstruction. Stool is still seen in the colon. No free intra-abdominal air is identified on the provided upright view.  No acute osseous abnormalities are seen; the sacroiliac joints are unremarkable in appearance.  IMPRESSION: 1. Nonspecific bowel gas  pattern; distention of small-bowel loops to 3.9 cm in maximal diameter, with stool still seen in the colon. This could reflect mild ileus, although partial small bowel obstruction might have a similar appearance. No free intra-abdominal air seen. 2. Mild bibasilar airspace opacities likely reflect atelectasis; mild peribronchial thickening noted. Borderline cardiomegaly.   Electronically Signed   By: Garald Balding M.D.   On: 12/29/2013 02:25    Anti-infectives: Anti-infectives    None       Assessment/Plan  1. SBO 2. H/o stage III colon ca  Plan: 1. X-rays reveal contrast in colon.  He is passing flatus.  Will dc NGT and give him clear liquids. 2. Check CEA level.   LOS: 1 day    Eleanor Dimichele E 12/30/2013, 9:30  AM Pager: [phone]

## 2013-12-30 NOTE — Progress Notes (Signed)
Family Medicine Teaching Service Daily Progress Note Intern Pager: [phone]  Patient name: Alan Riley Medical record number: [id number] Date of birth: 12-Nov-1945 Age: 68 y.o. Gender: male  Primary Care Provider: Willeen Niece, MD Consultants: surgery Code Status: FULL  Pt Overview and Major Events to Date:  12/07: NGT out today.   Assessment and Plan: Alan Riley is a 68 y.o. male presenting with partial small bowel obstruction. PMH is significant for colon cancer s/p resection in 2004, prior SBO, and HLD.  SBO. Abdominal CT reveals partial SBO due to adhesions likely related to prior abdominal surgery (s/p partial bowel resection in 2004 2/2 colon cancer). Patient had last SBO in 2014 and was admitted for 2-3 days. Repeat abd xray shows decompression of SBO. -per surgery, NGT out today and start clear liquid diet -Advance diet as tolerated.  -Will try to avoid narcotics  -Zofran prn nausea -Toradol prn pain -c/s surgery. Appreciate recs.  Mildly hypokalemic: K on admission 4.2, K today 3.6 -Will continue to monitor.   -Will replete if K<3.4  HLD. Chol 179, TG 231, HDL 30, LDL 103 -Hold home statin while NPO  History of colon cancer. S/p partial bowel resection and anastomosis in 2004. Currently in remission. -Planning to have colonoscopy next year for follow up -Will obtain CEA level today  FEN/GI: Clear Liquid diet, D5 1/2NS @100cc /hr Prophylaxis: Lovenox  Disposition: Discharge home pending improvement in abdominal pain and nausea and ability to tolerate PO  Subjective:  Patient reports that he is feeling better today.  Abdominal pain has greatly improved.  Patient reports that he is passing gas but has not had a BM yet.  Denies N/V.    Objective: Temp:  [98.5 F (36.9 C)-98.8 F (37.1 C)] 98.5 F (36.9 C) (12/07 0608) Pulse Rate:  [57-68] 65 (12/07 0800) Resp:  [14-18] 14 (12/07 0800) BP: (120-133)/(57-61) 123/57 mmHg (12/07 0800) SpO2:  [96 %-98 %] 97  % (12/07 0800) Physical Exam: General: awake, alert, lying in bed, NAD, good eye contact Cardiovascular: RRR, no m/r/g, brisk cap refill Respiratory: CTAB, no increased WOB Abdomen: soft, NT/ND, +BS Extremities: WWP, no cyanosis, clubbing or edema Neuro: no focal deficits, patient follows commands  Laboratory:  Recent Labs Lab 12/29/13 0106 12/30/13 0659  WBC 15.1* 6.4  HGB 14.3 13.7  HCT 42.2 40.9  PLT 144* 144*    Recent Labs Lab 12/29/13 0106 12/30/13 0659  NA 141 142  K 4.2 3.6*  CL 103 104  CO2 26 27  BUN 13 10  CREATININE 0.87 0.75  CALCIUM 9.0 8.5  PROT 6.9  --   BILITOT 0.7  --   ALKPHOS 52  --   ALT 19  --   AST 20  --   GLUCOSE 138* 111*    Imaging/Diagnostic Tests: Ct Abdomen Pelvis W Contrast  12/29/2013   CLINICAL DATA:  Acute onset of nausea and vomiting. Initial encounter.  EXAM: CT ABDOMEN AND PELVIS WITH CONTRAST  TECHNIQUE: Multidetector CT imaging of the abdomen and pelvis was performed using the standard protocol following bolus administration of intravenous contrast.  CONTRAST:  169mL OMNIPAQUE IOHEXOL 300 MG/ML  SOLN  COMPARISON:  CT of the abdomen and pelvis performed 04/21/2012  FINDINGS: A lymph node is noted along the right major fissure, stable from 2011. Minimal bibasilar atelectasis is noted. Scattered coronary artery calcification is noted.  There is mild dilatation of small bowel loops to 3.3 cm in maximal diameter, with focal fecalization of the proximal to mid ileum  at the right mid abdomen. More distal loops are largely decompressed, though a small amount of fluid and air is still seen within more distal small bowel loops. This likely reflects partial small bowel obstruction, due to an adhesion.  The ileocolic anastomosis is grossly unremarkable in appearance. Trace residual stool is noted within the ascending colon, though it is otherwise largely decompressed. A single diverticulum is noted at the distal descending colon. The colon is  otherwise unremarkable.  The liver and spleen are unremarkable in appearance. The patient is status post cholecystectomy. The pancreas and adrenal glands are unremarkable.  Mild nonspecific perinephric stranding is noted bilaterally. The kidneys are otherwise unremarkable. There is no evidence of hydronephrosis. No renal or ureteral stones are seen.  No free fluid is identified. The small bowel is unremarkable in appearance. The stomach is within normal limits. No acute vascular abnormalities are seen. Note is made of mild mesenteric inflammation at the mid abdomen; this is stable from prior studies. Mild scattered calcification is seen along the abdominal aorta and its branches.  The bladder is mildly distended and grossly unremarkable. The prostate is borderline enlarged, measuring 4.8 cm in transverse dimension, with minimal calcification. A small left inguinal hernia is seen, containing only fat. No inguinal lymphadenopathy is seen.  No acute osseous abnormalities are identified. There is diffuse osteopenia of visualized osseous structures. There is minimal grade 1 anterolisthesis of L4 on L5, reflecting underlying facet disease.  IMPRESSION: 1. Mild dilatation of small-bowel loops to 3.3 cm in maximal diameter, with focal fecalization of the proximal to mid ileum, at the right mid abdomen. More distal loops are largely decompressed, though a small amount of fluid and air is still seen within more distal small bowel loops. This likely reflects partial small-bowel obstruction due to an adhesion. 2. Ileocolic anastomosis is unremarkable in appearance. 3. Chronic mild nonspecific mesenteric inflammation noted at the mid abdomen, stable in appearance. 4. Borderline enlarged prostate. 5. Small left inguinal hernia, containing only fat. 6. Diffuse osteopenia of visualized osseous structures. 7. Scattered coronary artery calcification noted. 8. Mild scattered calcification along the abdominal aorta and its branches.    Electronically Signed   By: Garald Balding M.D.   On: 12/29/2013 05:57   Dg Abd 2 Views  12/30/2013   CLINICAL DATA:  68 year old male with malignant neoplasm of the colon. Small bowel obstruction follow-up. Subsequent encounter.  EXAM: ABDOMEN - 2 VIEW  COMPARISON:  12/29/2013 CT.  FINDINGS: Nasogastric tube has been placed. The tip projects over the right lower quadrant of the abdomen which I suspect is in the region of the distal descending portion of the duodenum.  Decompression of previously noted small bowel obstructive pattern. Currently, no gas distended small bowel loops are detected. Fluid-filled loops of bowel may not be detected by plain film examination.  No free intraperitoneal air detected.  IMPRESSION: Placement of nasogastric tube with decompression of recently noted small bowel obstructive pattern.   Electronically Signed   By: Chauncey Cruel M.D.   On: 12/30/2013 07:48   Dg Abd Acute W/chest  12/29/2013   CLINICAL DATA:  Acute onset of generalized abdominal pain and vomiting. Initial encounter.  EXAM: ACUTE ABDOMEN SERIES (ABDOMEN 2 VIEW & CHEST 1 VIEW)  COMPARISON:  Abdominal radiograph performed 04/23/2012, and chest radiograph performed 04/21/2012  FINDINGS: The lungs are well-aerated. Mild peribronchial thickening is noted. Mild bibasilar airspace opacities likely reflect atelectasis. There is no evidence of pleural effusion or pneumothorax. The cardiomediastinal silhouette is borderline  enlarged.  The visualized bowel gas pattern is nonspecific. There is distention of small-bowel loops to 3.9 cm in maximal diameter, without definite evidence for bowel obstruction. Stool is still seen in the colon. No free intra-abdominal air is identified on the provided upright view.  No acute osseous abnormalities are seen; the sacroiliac joints are unremarkable in appearance.  IMPRESSION: 1. Nonspecific bowel gas pattern; distention of small-bowel loops to 3.9 cm in maximal diameter, with stool still  seen in the colon. This could reflect mild ileus, although partial small bowel obstruction might have a similar appearance. No free intra-abdominal air seen. 2. Mild bibasilar airspace opacities likely reflect atelectasis; mild peribronchial thickening noted. Borderline cardiomegaly.   Electronically Signed   By: Garald Balding M.D.   On: 12/29/2013 02:25     Janora Norlander, DO 12/30/2013, 8:33 AM PGY-1, [place] Intern pager: [phone], text pages welcome

## 2013-12-30 NOTE — Plan of Care (Signed)
Problem: Phase II Progression Outcomes Goal: Vital signs remain stable Outcome: Completed/Met Date Met:  12/30/13 Goal: Obtain order to discontinue catheter if appropriate Outcome: Completed/Met Date Met:  12/30/13  Problem: Phase III Progression Outcomes Goal: Pain controlled on oral analgesia Outcome: Completed/Met Date Met:  12/30/13 Goal: Voiding independently Outcome: Completed/Met Date Met:  12/30/13 Goal: Foley discontinued Outcome: Completed/Met Date Met:  12/30/13

## 2013-12-31 DIAGNOSIS — R1084 Generalized abdominal pain: Secondary | ICD-10-CM

## 2013-12-31 LAB — CEA: CEA: 0.8 ng/mL (ref 0.0–5.0)

## 2013-12-31 LAB — BASIC METABOLIC PANEL
ANION GAP: 11 (ref 5–15)
BUN: 7 mg/dL (ref 6–23)
CALCIUM: 8.6 mg/dL (ref 8.4–10.5)
CHLORIDE: 105 meq/L (ref 96–112)
CO2: 27 mEq/L (ref 19–32)
Creatinine, Ser: 0.72 mg/dL (ref 0.50–1.35)
GFR calc non Af Amer: >90 mL/min (ref >90)
Glucose, Bld: 83 mg/dL (ref 70–99)
Potassium: 3.9 mEq/L (ref 3.7–5.3)
Sodium: 143 mEq/L (ref 137–147)

## 2013-12-31 MED ORDER — ATORVASTATIN CALCIUM 20 MG PO TABS
20.0000 mg | ORAL_TABLET | Freq: Every day | ORAL | Status: DC
  Administered 2013-12-31: 20 mg via ORAL
  Filled 2013-12-31 (×2): qty 1

## 2013-12-31 NOTE — Progress Notes (Signed)
Family Medicine Teaching Service Daily Progress Note Intern Pager: [phone]  Patient name: Alan Riley Medical record number: [id number] Date of birth: [birth year] Age: 68 y.o. Gender: male  Primary Care Provider: Willeen Niece, MD Consultants: Surgery Code Status: FULL  Pt Overview and Major Events to Date:  12/07: NGT out   Assessment and Plan: Math Brazie is a 68 y.o. male presenting with partial small bowel obstruction. PMH is significant for colon cancer s/p resection in 2004, prior SBO, and HLD.  SBO. Abdominal CT reveals partial SBO due to adhesions likely related to prior abdominal surgery (s/p partial bowel resection in 2004 2/2 colon cancer). Patient had last SBO in 2014 and was admitted for 2-3 days. Repeat abd xray shows decompression of SBO. -Surgery consulted, appreciate assistance -Advance diet as tolerated.  -Will try to avoid narcotics  -Zofran prn nausea -Toradol prn pain  Mildly hypokalemic: K on admission 4.2 -Will continue to monitor.   -Will replete if K<3.4  HLD. Chol 179, TG 231, HDL 30, LDL 103 -Restart home statin  History of colon cancer. S/p partial bowel resection and anastomosis in 2004. Currently in remission. -Planning to have colonoscopy next year for follow up -Will obtain CEA level today  FEN/GI: Full Liquid diet, D5 1/2NS @100cc /hr Prophylaxis: Lovenox  Disposition: Discharge home pending improvement in abdominal pain and nausea and ability to tolerate PO  Subjective:  Doing well this morning. Able to tolerate clear liquids. BM yesterday evening. No abdominal pain.    Objective: Temp:  [97.5 F (36.4 C)-98.7 F (37.1 C)] 97.5 F (36.4 C) (12/08 0640) Pulse Rate:  [55-65] 55 (12/08 0640) Resp:  [14-16] 16 (12/08 0640) BP: (111-123)/(57-66) 122/66 mmHg (12/08 0640) SpO2:  [96 %-99 %] 97 % (12/08 0640) Physical Exam: General: awake, alert, lying in bed, NAD, good eye contact Cardiovascular: RRR, no m/r/g, brisk cap  refill Respiratory: CTAB, no increased WOB Abdomen: soft, NT/ND, +BS Extremities: WWP, no cyanosis, clubbing or edema Neuro: no focal deficits, patient follows commands  Laboratory:  Recent Labs Lab 12/29/13 0106 12/30/13 0659  WBC 15.1* 6.4  HGB 14.3 13.7  HCT 42.2 40.9  PLT 144* 144*    Recent Labs Lab 12/29/13 0106 12/30/13 0659  NA 141 142  K 4.2 3.6*  CL 103 104  CO2 26 27  BUN 13 10  CREATININE 0.87 0.75  CALCIUM 9.0 8.5  PROT 6.9  --   BILITOT 0.7  --   ALKPHOS 52  --   ALT 19  --   AST 20  --   GLUCOSE 138* 111*    Imaging/Diagnostic Tests: Dg Abd 2 Views  12/30/2013   CLINICAL DATA:  68 year old male with malignant neoplasm of the colon. Small bowel obstruction follow-up. Subsequent encounter.  EXAM: ABDOMEN - 2 VIEW  COMPARISON:  12/29/2013 CT.  FINDINGS: Nasogastric tube has been placed. The tip projects over the right lower quadrant of the abdomen which I suspect is in the region of the distal descending portion of the duodenum.  Decompression of previously noted small bowel obstructive pattern. Currently, no gas distended small bowel loops are detected. Fluid-filled loops of bowel may not be detected by plain film examination.  No free intraperitoneal air detected.  IMPRESSION: Placement of nasogastric tube with decompression of recently noted small bowel obstructive pattern.   Electronically Signed   By: Chauncey Cruel M.D.   On: 12/30/2013 07:48     Dimas Chyle, MD 12/31/2013, 7:59 AM PGY-1, [place] Intern pager:  [phone], text pages welcome

## 2013-12-31 NOTE — Plan of Care (Signed)
Problem: Phase II Progression Outcomes Goal: Progress activity as tolerated unless otherwise ordered Outcome: Completed/Met Date Met:  12/31/13 Goal: Discharge plan established Outcome: Completed/Met Date Met:  12/31/13 Goal: IV changed to normal saline lock Outcome: Completed/Met Date Met:  12/31/13 Goal: Other Phase II Outcomes/Goals Outcome: Completed/Met Date Met:  12/31/13  Problem: Phase III Progression Outcomes Goal: Activity at appropriate level-compared to baseline (UP IN CHAIR FOR HEMODIALYSIS)  Outcome: Completed/Met Date Met:  12/31/13 Goal: Discharge plan remains appropriate-arrangements made Outcome: Completed/Met Date Met:  12/31/13 Goal: Other Phase III Outcomes/Goals Outcome: Completed/Met Date Met:  12/31/13

## 2013-12-31 NOTE — Discharge Instructions (Signed)
We are so glad to see that you are feeling better, Alan Riley!  You were admitted with nausea, vomiting and abdominal pain and found to have a small bowel obstruction.  You were seen by both the [facility] Medicine team and by Surgery during your hospitalization.  Thankfully, you got better without surgical intervention.  Please make sure that you follow up with Dr Lindell Noe on 12/11 at 8:45am.  You may return to work.   Small Bowel Obstruction A small bowel obstruction means something is blocking the small intestine. The small intestine is the long tube that connects the stomach to the colon. Treatment depends on what is causing the problem. Treatment also depends on how bad the problem is. HOME CARE Your doctor may let you go home if your small bowel is not completely blocked.  Rest.  Follow your diet as told by your doctor.  Only drink clear liquids until you start to get better.  Avoid solid foods as told by your doctor.  Only take medicine as told by your doctor. GET HELP RIGHT AWAY IF:  You have pain or cramps that get worse.  You throw up (vomit) blood.  You feel sick to your stomach (nauseous), or you cannot stop throwing up.  You cannot drink fluids.  You feel confused.  You feel dry or thirsty (dehydrated).  Your belly gets more puffy (bloated).  You have chills.  You have a fever.  You feel weak, or you pass out (faint). MAKE SURE YOU:  Understand these instructions.  Will watch your condition.  Will get help right away if you are not doing well or get worse. Document Released: 02/18/2004 Document Revised: 04/04/2011 Document Reviewed: 04/20/2010 [facility] Patient Information 2015 [place], [place]. This information is not intended to replace advice given to you by your health care provider. Make sure you discuss any questions you have with your health care provider.

## 2013-12-31 NOTE — Progress Notes (Signed)
[facility] Progress Note    Subjective: Patient looks great.  Had BM last night about 1030.  Objective: Vital signs in last 24 hours: Temp:  [97.5 F (36.4 C)-98.7 F (37.1 C)] 97.5 F (36.4 C) (12/08 0640) Pulse Rate:  [55-56] 55 (12/08 0640) Resp:  [16] 16 (12/08 0640) BP: (111-122)/(61-66) 122/66 mmHg (12/08 0640) SpO2:  [96 %-99 %] 97 % (12/08 0640) Last BM Date: 01-08-2014  Intake/Output from previous day: 01-09-2023 0701 - 12/08 0700 In: 1550 [P.O.:480; I.V.:1070] Out: 450 [Emesis/NG output:450] Intake/Output this shift:    General: No distress.  Sitting at bedside eating clear l iquids.  Lungs: Clear  Abd: Good bowel sounds.  Nontender.  Extremities: No changes  Neuro: Intact  Lab Results:  @LABLAST2 (wbc:2,hgb:2,hct:2,plt:2) BMET  Recent Labs  12/29/13 0106 2014/01/08 0659  NA 141 142  K 4.2 3.6*  CL 103 104  CO2 26 27  GLUCOSE 138* 111*  BUN 13 10  CREATININE 0.87 0.75  CALCIUM 9.0 8.5   PT/INR No results for input(s): LABPROT, INR in the last 72 hours. ABG No results for input(s): PHART, HCO3 in the last 72 hours.  Invalid input(s): PCO2, PO2  Studies/Results: Dg Abd 2 Views  Jan 08, 2014   CLINICAL DATA:  68 year old male with malignant neoplasm of the colon. Small bowel obstruction follow-up. Subsequent encounter.  EXAM: ABDOMEN - 2 VIEW  COMPARISON:  12/29/2013 CT.  FINDINGS: Nasogastric tube has been placed. The tip projects over the right lower quadrant of the abdomen which I suspect is in the region of the distal descending portion of the duodenum.  Decompression of previously noted small bowel obstructive pattern. Currently, no gas distended small bowel loops are detected. Fluid-filled loops of bowel may not be detected by plain film examination.  No free intraperitoneal air detected.  IMPRESSION: Placement of nasogastric tube with decompression of recently noted small bowel obstructive pattern.   Electronically Signed   By: Chauncey Cruel M.D.   On:  2014/01/08 07:48    Anti-infectives: Anti-infectives    None      Assessment/Plan: s/p  Advance diet Should be able to go home soon.  LOS: 2 days   Kathryne Eriksson. Dahlia Bailiff, MD, FACS [phone] [facility] Surgery 12/31/2013

## 2013-12-31 NOTE — Discharge Summary (Signed)
[facility] Discharge Summary  Patient name: Alan Riley Medical record number: [id number] Date of birth: 19-Aug-1945 Age: 68 y.o. Gender: male Date of Admission: 12/29/2013  Date of Discharge: 01/01/14 Admitting Physician: Alan Niece, MD  Primary Care Provider: Willeen Niece, MD Consultants: surgery  Indication for Hospitalization: abdominal pain, nausea, vomiting  Discharge Diagnoses/Problem List:  Partial small bowel obstruction Generalized abdominal pain  Disposition: Discharge Home.  Discharge Condition: stable   Discharge Exam:  BP 124/62 mmHg  Pulse 53  Temp(Src) 98.2 F (36.8 C) (Oral)  Resp 18  Ht 5\' 8"  (1.727 m)  Wt 183 lb (83.008 kg)  BMI 27.83 kg/m2  SpO2 98% Gen: awake, alert, well nourished, well appearing male, NAD HEENT: [place]/AT, EOMI, MMM Cardio: S1S2, RRR, no m/r/g Pulm: CTAB, no increased WOB Abd: soft, NT/ND, no guarding or rebound, +BS Ext: WWP, no deformities Skin: no rashes Neuro: no focal deficits, patient responds to commands  Brief Hospital Course:  Alan Riley is a 68 year old male with history of adenocarcinoma of colon diagnosed 2004, in remission with annual follow-up, that presented with sudden onset abdominal pain, nausea/vomiting around 10:30pm on 12/05.    In the ED, patient had abdominal CT that was consistent with partial small bowel obstruction. Patient was given 2mg  total of dilaudid and 2 doses of zofran. NG tube was placed. Pain improved to a 0/10.    Patient was admitted to the [facility] Medicine inpatient service for further management.  In light of patient's h/o of SBO, surgery was consulted who recommended continued bowel rest and decompression via NGT.  By HD#2 patient's abdominal pain had almost resolved and he had no recurrence of nausea or vomiting.  He was having flatulus.  Abdominal xray was repeated which revealed a resolved SBO.  His NGT was removed and his diet was advanced slowly as  tolerated.  By that evening, he reported having passing stool.  His diet was advanced to a full diet, which he tolerated well.  Because of his h/o adenocarcinoma of the colon, a CEA was checked and was 0.8. On day of discharge, patient was tolerating a full diet without nausea or vomiting and was passing stool normally.  He was discharged home in stable condition.  Issues for Follow Up:  none  Significant Procedures: NG decompression  Significant Labs and Imaging:   Recent Labs Lab 12/29/13 0106 12/30/13 0659  WBC 15.1* 6.4  HGB 14.3 13.7  HCT 42.2 40.9  PLT 144* 144*    Recent Labs Lab 12/29/13 0106 12/30/13 0659 12/31/13 0930  NA 141 142 143  K 4.2 3.6* 3.9  CL 103 104 105  CO2 26 27 27   GLUCOSE 138* 111* 83  BUN 13 10 7   CREATININE 0.87 0.75 0.72  CALCIUM 9.0 8.5 8.6  ALKPHOS 52  --   --   AST 20  --   --   ALT 19  --   --   ALBUMIN 4.0  --   --    CEA: 0.8  Ct Abdomen Pelvis W Contrast  12/29/2013   CLINICAL DATA:  Acute onset of nausea and vomiting. Initial encounter.  EXAM: CT ABDOMEN AND PELVIS WITH CONTRAST  TECHNIQUE: Multidetector CT imaging of the abdomen and pelvis was performed using the standard protocol following bolus administration of intravenous contrast.  CONTRAST:  164mL OMNIPAQUE IOHEXOL 300 MG/ML  SOLN  COMPARISON:  CT of the abdomen and pelvis performed 04/21/2012  FINDINGS: A lymph node is noted  along the right major fissure, stable from 2011. Minimal bibasilar atelectasis is noted. Scattered coronary artery calcification is noted.  There is mild dilatation of small bowel loops to 3.3 cm in maximal diameter, with focal fecalization of the proximal to mid ileum at the right mid abdomen. More distal loops are largely decompressed, though a small amount of fluid and air is still seen within more distal small bowel loops. This likely reflects partial small bowel obstruction, due to an adhesion.  The ileocolic anastomosis is grossly unremarkable in  appearance. Trace residual stool is noted within the ascending colon, though it is otherwise largely decompressed. A single diverticulum is noted at the distal descending colon. The colon is otherwise unremarkable.  The liver and spleen are unremarkable in appearance. The patient is status post cholecystectomy. The pancreas and adrenal glands are unremarkable.  Mild nonspecific perinephric stranding is noted bilaterally. The kidneys are otherwise unremarkable. There is no evidence of hydronephrosis. No renal or ureteral stones are seen.  No free fluid is identified. The small bowel is unremarkable in appearance. The stomach is within normal limits. No acute vascular abnormalities are seen. Note is made of mild mesenteric inflammation at the mid abdomen; this is stable from prior studies. Mild scattered calcification is seen along the abdominal aorta and its branches.  The bladder is mildly distended and grossly unremarkable. The prostate is borderline enlarged, measuring 4.8 cm in transverse dimension, with minimal calcification. A small left inguinal hernia is seen, containing only fat. No inguinal lymphadenopathy is seen.  No acute osseous abnormalities are identified. There is diffuse osteopenia of visualized osseous structures. There is minimal grade 1 anterolisthesis of L4 on L5, reflecting underlying facet disease.  IMPRESSION: 1. Mild dilatation of small-bowel loops to 3.3 cm in maximal diameter, with focal fecalization of the proximal to mid ileum, at the right mid abdomen. More distal loops are largely decompressed, though a small amount of fluid and air is still seen within more distal small bowel loops. This likely reflects partial small-bowel obstruction due to an adhesion. 2. Ileocolic anastomosis is unremarkable in appearance. 3. Chronic mild nonspecific mesenteric inflammation noted at the mid abdomen, stable in appearance. 4. Borderline enlarged prostate. 5. Small left inguinal hernia, containing  only fat. 6. Diffuse osteopenia of visualized osseous structures. 7. Scattered coronary artery calcification noted. 8. Mild scattered calcification along the abdominal aorta and its branches.   Electronically Signed   By: Garald Balding M.D.   On: 12/29/2013 05:57   Dg Abd 2 Views  12/30/2013   CLINICAL DATA:  68 year old male with malignant neoplasm of the colon. Small bowel obstruction follow-up. Subsequent encounter.  EXAM: ABDOMEN - 2 VIEW  COMPARISON:  12/29/2013 CT.  FINDINGS: Nasogastric tube has been placed. The tip projects over the right lower quadrant of the abdomen which I suspect is in the region of the distal descending portion of the duodenum.  Decompression of previously noted small bowel obstructive pattern. Currently, no gas distended small bowel loops are detected. Fluid-filled loops of bowel may not be detected by plain film examination.  No free intraperitoneal air detected.  IMPRESSION: Placement of nasogastric tube with decompression of recently noted small bowel obstructive pattern.   Electronically Signed   By: Chauncey Cruel M.D.   On: 12/30/2013 07:48   Dg Abd Acute W/chest  12/29/2013   CLINICAL DATA:  Acute onset of generalized abdominal pain and vomiting. Initial encounter.  EXAM: ACUTE ABDOMEN SERIES (ABDOMEN 2 VIEW & CHEST 1 VIEW)  COMPARISON:  Abdominal radiograph performed 04/23/2012, and chest radiograph performed 04/21/2012  FINDINGS: The lungs are well-aerated. Mild peribronchial thickening is noted. Mild bibasilar airspace opacities likely reflect atelectasis. There is no evidence of pleural effusion or pneumothorax. The cardiomediastinal silhouette is borderline enlarged.  The visualized bowel gas pattern is nonspecific. There is distention of small-bowel loops to 3.9 cm in maximal diameter, without definite evidence for bowel obstruction. Stool is still seen in the colon. No free intra-abdominal air is identified on the provided upright view.  No acute osseous  abnormalities are seen; the sacroiliac joints are unremarkable in appearance.  IMPRESSION: 1. Nonspecific bowel gas pattern; distention of small-bowel loops to 3.9 cm in maximal diameter, with stool still seen in the colon. This could reflect mild ileus, although partial small bowel obstruction might have a similar appearance. No free intra-abdominal air seen. 2. Mild bibasilar airspace opacities likely reflect atelectasis; mild peribronchial thickening noted. Borderline cardiomegaly.   Electronically Signed   By: Garald Balding M.D.   On: 12/29/2013 02:25    Results/Tests Pending at Time of Discharge: none  Discharge Medications:    Medication List    TAKE these medications        acetaminophen 325 MG tablet  Commonly known as:  TYLENOL  Take 2 tablets (650 mg total) by mouth every 6 (six) hours as needed.     atorvastatin 20 MG tablet  Commonly known as:  LIPITOR  Take 1 tablet (20 mg total) by mouth daily.     fish oil-omega-3 fatty acids 1000 MG capsule  Take 2 g by mouth daily.     VITAMIN D PO  Take by mouth.        Discharge Instructions: Please refer to Patient Instructions section of EMR for full details.  Patient was counseled important signs and symptoms that should prompt return to medical care, changes in medications, dietary instructions, activity restrictions, and follow up appointments.   Follow-Up Appointments: Follow-up Information    Follow up with Alan Niece, MD On 01/03/2014.   Specialty:  Family Medicine   Why:  08:45am   Contact information:   [place] [address] [phone]       Janora Norlander, DO 01/01/2014, 8:48 AM PGY-1, [place]

## 2014-01-01 NOTE — Progress Notes (Signed)
  Alan Riley discharged home per Lindell Noe, MD order. Patient left hospital in a stable state and IV was removed. Discharge instructions reviewed and discussed with patient. All questions and concerns answered. Copy of instructions and scripts given to patient.   Patient escorted to car by wife on foot. No distress noted upon discharge.   [name] 01/01/2014 1:05 PM

## 2014-01-03 ENCOUNTER — Encounter: Payer: Self-pay | Admitting: Family Medicine

## 2014-01-03 ENCOUNTER — Ambulatory Visit (INDEPENDENT_AMBULATORY_CARE_PROVIDER_SITE_OTHER): Payer: No Typology Code available for payment source | Admitting: Family Medicine

## 2014-01-03 VITALS — BP 138/74 | HR 85 | Temp 98.3°F | Ht 68.5 in | Wt 181.2 lb

## 2014-01-03 DIAGNOSIS — K5669 Other intestinal obstruction: Secondary | ICD-10-CM

## 2014-01-03 DIAGNOSIS — K56609 Unspecified intestinal obstruction, unspecified as to partial versus complete obstruction: Secondary | ICD-10-CM

## 2014-01-03 NOTE — Patient Instructions (Signed)
It was a pleasure to see you today. I am glad you are back to feeling well after your recent hospitalization.   Please keep your appointment with Dr Cristina Gong in February.   We will reschedule the Dermatology appointment.  Letter for work; may do FMLA for 5 consecutive days due to illness/care of family member.

## 2014-01-03 NOTE — Assessment & Plan Note (Signed)
Seen today for follow up from hospitalization for SBO. Patient is s/p resection of colon cancer. CEA drawn inpatient 0.8. Plans for follow up with his GI Dr Cristina Gong in February 2016. Is pain-free and tolerating full diet. NOte for work; wife to submit FMLA form for my completion as his caregiver, which has caused her to miss work in the past 1 year.

## 2014-01-03 NOTE — Progress Notes (Signed)
   Subjective:    Patient ID: Alan Riley, male    DOB: [birth year], 68 y.o.   MRN: [id number]  HPI Patient here for followup from recent hospitalization for SBO, discharged 12/09 and doing well. Tolerating orals without pain or n/v. No fevers or chills, no diarrhea.   Plans to resume work today. Needs note for work out from Sunday 12/06 through 12/10.    Review of Systems     Objective:   Physical Exam Well appearing, no apparent distress HEENT Neck supple, no apparent distress COR Regular S1S2 PULM Clear  ABD SOft, nontender, nondistended. Audible bowel sounds. No masses or megaly.        Assessment & Plan:

## 2014-01-03 NOTE — Assessment & Plan Note (Signed)
Patient seen for follow up from hospital for SBO. Doing well, tolerating by mouth and withuot pain. Keep GI follow up in Feb 2016 as part of his annual follow ujp for history of colon adenocarcinoma.

## 2014-01-13 ENCOUNTER — Telehealth: Payer: Self-pay | Admitting: Hematology

## 2014-01-13 NOTE — Telephone Encounter (Signed)
, °

## 2014-01-29 ENCOUNTER — Other Ambulatory Visit: Payer: Self-pay | Admitting: *Deleted

## 2014-01-29 DIAGNOSIS — Z85038 Personal history of other malignant neoplasm of large intestine: Secondary | ICD-10-CM

## 2014-01-30 ENCOUNTER — Ambulatory Visit (HOSPITAL_BASED_OUTPATIENT_CLINIC_OR_DEPARTMENT_OTHER): Payer: No Typology Code available for payment source | Admitting: Hematology

## 2014-01-30 ENCOUNTER — Encounter: Payer: Self-pay | Admitting: Hematology

## 2014-01-30 ENCOUNTER — Other Ambulatory Visit (HOSPITAL_BASED_OUTPATIENT_CLINIC_OR_DEPARTMENT_OTHER): Payer: No Typology Code available for payment source

## 2014-01-30 VITALS — BP 142/65 | HR 61 | Temp 97.7°F | Resp 18 | Ht 68.0 in | Wt 179.0 lb

## 2014-01-30 DIAGNOSIS — Z85038 Personal history of other malignant neoplasm of large intestine: Secondary | ICD-10-CM

## 2014-01-30 LAB — CBC WITH DIFFERENTIAL/PLATELET
BASO%: 0.1 % (ref 0.0–2.0)
BASOS ABS: 0 10*3/uL (ref 0.0–0.1)
EOS ABS: 0 10*3/uL (ref 0.0–0.5)
EOS%: 0.4 % (ref 0.0–7.0)
HEMATOCRIT: 41.3 % (ref 38.4–49.9)
HGB: 13.7 g/dL (ref 13.0–17.1)
LYMPH%: 21.3 % (ref 14.0–49.0)
MCH: 31.6 pg (ref 27.2–33.4)
MCHC: 33.2 g/dL (ref 32.0–36.0)
MCV: 95.2 fL (ref 79.3–98.0)
MONO#: 0.7 10*3/uL (ref 0.1–0.9)
MONO%: 9.9 % (ref 0.0–14.0)
NEUT#: 4.7 10*3/uL (ref 1.5–6.5)
NEUT%: 68.3 % (ref 39.0–75.0)
Platelets: 154 10*3/uL (ref 140–400)
RBC: 4.34 10*6/uL (ref 4.20–5.82)
RDW: 12.6 % (ref 11.0–14.6)
WBC: 6.9 10*3/uL (ref 4.0–10.3)
lymph#: 1.5 10*3/uL (ref 0.9–3.3)

## 2014-01-30 LAB — COMPREHENSIVE METABOLIC PANEL (CC13)
ALBUMIN: 4.2 g/dL (ref 3.5–5.0)
ALT: 15 U/L (ref 0–55)
AST: 15 U/L (ref 5–34)
Alkaline Phosphatase: 65 U/L (ref 40–150)
Anion Gap: 9 mEq/L (ref 3–11)
BILIRUBIN TOTAL: 0.54 mg/dL (ref 0.20–1.20)
BUN: 8.3 mg/dL (ref 7.0–26.0)
CO2: 28 mEq/L (ref 22–29)
Calcium: 8.9 mg/dL (ref 8.4–10.4)
Chloride: 107 mEq/L (ref 98–109)
Creatinine: 0.8 mg/dL (ref 0.7–1.3)
EGFR: >90 mL/min/{1.73_m2} (ref >90)
Glucose: 103 mg/dl (ref 70–140)
POTASSIUM: 3.9 meq/L (ref 3.5–5.1)
SODIUM: 144 meq/L (ref 136–145)
Total Protein: 6.9 g/dL (ref 6.4–8.3)

## 2014-01-30 NOTE — Progress Notes (Signed)
[facility] OFFICE PROGRESS NOTE  [name]. Daiva Nakayama, MD Ronald Lobo, MD Lina Sayre. Everlene Farrier, MD  DIAGNOSIS: History of colon cancer, stage III  Chief Complaint  Patient presents with  . Follow-up    colon CA   PROBLEM LIST:  1. Moderately differentiated near obstructing adenocarcinoma involving the ascending colon with 6/14 positive lymph nodes, T3 N2, stage IIIC, status post hemicolectomy on 01/15/2003. Adjuvant chemotherapy with FOLFOX 12 cycles were administered from 03/04/2003 through August 2005. The patient remains without evidence of recurrent disease.  2. History of adenomatous colonic polyps with most recent colonoscopy on 03/15/2011.  3. History of iron-deficiency anemia secondary to colon cancer.   CURRENT THERAPY: Observation.   INTERVAL HISTORY: Chet Greenley 69 y.o. male with a history of stage IIIC adenocarcinoma of the ascending colon dating back to December 2004 is here for follow-up.  He is accompanied by his son Burnard Hawthorne who interprets for him, but he does speaks and understands most conversation in [place].  His native language is [place].  He had episodes of SBO on 01/01/2014 and was hospitalized. He was managed conservatively and discharged home after 5 days of hospital stay. He had a similar 3-4 episodes in the past 9-10 years, all managed conservatively and resolved spontaneously. It does seem to be more frequent over years. He lately feels well, no nausea, abdominal discomfort, no constipation or diarrhea. He has good appetite and his weight is stable.   MEDICAL HISTORY: Past Medical History  Diagnosis Date  . Malignant neoplasm of ascending colon   . Iron deficiency anemia secondary to blood loss (chronic)   . Hx of adenomatous colonic polyps      ALLERGIES:  has No Known Allergies.  MEDICATIONS: has a current medication list which includes the following prescription(s): acetaminophen, atorvastatin, cholecalciferol, and fish oil-omega-3 fatty  acids.  SURGICAL HISTORY:  Past Surgical History  Procedure Laterality Date  . Hemicolectomy  01/15/03  . Cholecystectomy  1991    REVIEW OF SYSTEMS:   Constitutional: Denies fevers, chills or abnormal weight loss Eyes: Denies blurriness of vision Ears, nose, mouth, throat, and face: Denies mucositis or sore throat Respiratory: Denies cough, dyspnea or wheezes Cardiovascular: Denies palpitation, chest discomfort or lower extremity swelling Gastrointestinal:  Denies nausea, heartburn or change in bowel habits Skin: Denies abnormal skin rashes Lymphatics: Denies new lymphadenopathy or easy bruising Neurological:Denies numbness, tingling or new weaknesses Behavioral/Psych: Mood is stable, no new changes  All other systems were reviewed with the patient and are negative.  PHYSICAL EXAMINATION: ECOG PERFORMANCE STATUS: 0 - Asymptomatic  Blood pressure 142/65, pulse 61, temperature 97.7 F (36.5 C), temperature source Oral, resp. rate 18, height 5\' 8"  (1.727 m), weight 179 lb (81.194 kg), SpO2 100 %.  GENERAL:alert, no distress and comfortable; well developed and well nourished.  Bosnian speaker intermittently.  SKIN: skin color, texture, turgor are normal, no rashes or significant lesions EYES: normal, Conjunctiva are pink and non-injected, sclera clear OROPHARYNX:no exudate, no erythema and lips, buccal mucosa, and tongue normal  NECK: supple, thyroid normal size, non-tender, without nodularity LYMPH:  no palpable lymphadenopathy in the cervical, axillary or supraclavicular LUNGS: clear to auscultation and percussion with normal breathing effort HEART: regular rate & rhythm and no murmurs and no lower extremity edema ABDOMEN:abdomen soft, non-tender and normal bowel sounds; multiple scars well healed.  Musculoskeletal:no cyanosis of digits and no clubbing  NEURO: alert & oriented x 3 with fluent speech, no focal motor/sensory deficits   LABORATORY DATA: CBC  Latest Ref Rng  01/30/2014 12/30/2013 12/29/2013  WBC 4.0 - 10.3 10e3/uL 6.9 6.4 15.1(H)  Hemoglobin 13.0 - 17.1 g/dL 13.7 13.7 14.3  Hematocrit 38.4 - 49.9 % 41.3 40.9 42.2  Platelets 140 - 400 10e3/uL 154 144(L) 144(L)    CMP Latest Ref Rng 01/30/2014 12/31/2013 12/30/2013  Glucose 70 - 140 mg/dl 103 83 111(H)  BUN 7.0 - 26.0 mg/dL 8.3 7 10   Creatinine 0.7 - 1.3 mg/dL 0.8 0.72 0.75  Sodium 136 - 145 mEq/L 144 143 142  Potassium 3.5 - 5.1 mEq/L 3.9 3.9 3.6(L)  Chloride 96 - 112 mEq/L - 105 104  CO2 22 - 29 mEq/L 28 27 27   Calcium 8.4 - 10.4 mg/dL 8.9 8.6 8.5  Total Protein 6.4 - 8.3 g/dL 6.9 - -  Total Bilirubin 0.20 - 1.20 mg/dL 0.54 - -  Alkaline Phos 40 - 150 U/L 65 - -  AST 5 - 34 U/L 15 - -  ALT 0 - 55 U/L 15 - -   CEA  Status: Finalresult Visible to patient:  MyChart Nextappt: None         Ref Range 11mo ago    CEA 0.0 - 5.0 ng/mL 0.8   Comments: Performed at [facility]: No new scans.   ASSESSMENT AND PLAN: Ahijah Devery 69 y.o. male with a history of History of colon cancer, stage III  1. History of colon cancer, stage III. -- Mr. Maione continues to do well now over 11 years from the time of diagnosis. He had moderately differentiated near obstructing adenocarcinoma involving the ascending colon with 6/14 positive lymph nodes, T3 N2, stage IIIC, status post hemicolectomy on 01/15/2003. Adjuvant chemotherapy with FOLFOX 12 cycles were administered from 03/04/2003 through August 2005. The patient remains without evidence of recurrent disease. CEA remains to be negative.   -His risks of cancer recurrence is minimal by now, no more routine follow-up is required.  --He is scheduled to have repeated colonoscopy by Dr. Cristina Gong next months.   2. Follow-up.  -I suggest him to follow-up with his primary care physician, no routine cancer surveillance is needed anymore. He knows to call me if needed in the future.  I spent 10 minutes  counseling the patient face to face. The total time spent in the appointment was 15 minutes.   Truitt Merle, MD 01/30/2014 2:41 PM

## 2014-01-31 ENCOUNTER — Ambulatory Visit: Payer: PRIVATE HEALTH INSURANCE

## 2014-01-31 ENCOUNTER — Other Ambulatory Visit: Payer: PRIVATE HEALTH INSURANCE

## 2014-01-31 LAB — CEA: CEA: 1 ng/mL (ref 0.0–5.0)

## 2014-02-05 ENCOUNTER — Other Ambulatory Visit: Payer: Self-pay | Admitting: Dermatology

## 2014-02-10 ENCOUNTER — Encounter: Payer: Self-pay | Admitting: Family Medicine

## 2014-02-10 DIAGNOSIS — C44319 Basal cell carcinoma of skin of other parts of face: Secondary | ICD-10-CM

## 2014-02-10 NOTE — Assessment & Plan Note (Signed)
I received a letter from dermatologist Dr. Druscilla Brownie, III Asher Muir [phone]) to say that patient did not keep appointment on 12/30/2013 and has not rescheduled.  I am asking staff to please call to notify the dermatologist's office that the patient was hospitalized at [facility] on the day of his dermatology appointment, and to see if he has rescheduled. Faxing back the dermatologist's fax notice with my own annotation to request he be allowed to reschedule.   Dalbert Mayotte, MD

## 2014-04-01 ENCOUNTER — Other Ambulatory Visit: Payer: Self-pay | Admitting: Gastroenterology

## 2014-08-05 ENCOUNTER — Emergency Department (HOSPITAL_COMMUNITY): Payer: No Typology Code available for payment source

## 2014-08-05 ENCOUNTER — Emergency Department (HOSPITAL_COMMUNITY)
Admission: EM | Admit: 2014-08-05 | Discharge: 2014-08-05 | Disposition: A | Discharge status: Home / Self Care | Payer: No Typology Code available for payment source | Attending: Emergency Medicine | Admitting: Emergency Medicine

## 2014-08-05 ENCOUNTER — Encounter (HOSPITAL_COMMUNITY): Payer: Self-pay | Admitting: Emergency Medicine

## 2014-08-05 DIAGNOSIS — R197 Diarrhea, unspecified: Secondary | ICD-10-CM

## 2014-08-05 DIAGNOSIS — R112 Nausea with vomiting, unspecified: Secondary | ICD-10-CM

## 2014-08-05 DIAGNOSIS — R109 Unspecified abdominal pain: Secondary | ICD-10-CM

## 2014-08-05 DIAGNOSIS — E876 Hypokalemia: Secondary | ICD-10-CM

## 2014-08-05 DIAGNOSIS — K2970 Gastritis, unspecified, without bleeding: Secondary | ICD-10-CM | POA: Insufficient documentation

## 2014-08-05 DIAGNOSIS — R1011 Right upper quadrant pain: Secondary | ICD-10-CM | POA: Diagnosis not present

## 2014-08-05 DIAGNOSIS — Z86010 Personal history of colonic polyps: Secondary | ICD-10-CM | POA: Insufficient documentation

## 2014-08-05 DIAGNOSIS — Z862 Personal history of diseases of the blood and blood-forming organs and certain disorders involving the immune mechanism: Secondary | ICD-10-CM | POA: Insufficient documentation

## 2014-08-05 DIAGNOSIS — R1013 Epigastric pain: Secondary | ICD-10-CM | POA: Diagnosis present

## 2014-08-05 DIAGNOSIS — Z85038 Personal history of other malignant neoplasm of large intestine: Secondary | ICD-10-CM | POA: Diagnosis not present

## 2014-08-05 DIAGNOSIS — R1012 Left upper quadrant pain: Secondary | ICD-10-CM | POA: Insufficient documentation

## 2014-08-05 LAB — COMPREHENSIVE METABOLIC PANEL
ALK PHOS: 57 U/L (ref 38–126)
ALT: 22 U/L (ref 17–63)
AST: 23 U/L (ref 15–41)
Albumin: 4.1 g/dL (ref 3.5–5.0)
Anion gap: 10 (ref 5–15)
BUN: 9 mg/dL (ref 6–20)
CHLORIDE: 103 mmol/L (ref 101–111)
CO2: 25 mmol/L (ref 22–32)
Calcium: 8.5 mg/dL — ABNORMAL LOW (ref 8.9–10.3)
Creatinine, Ser: 0.83 mg/dL (ref 0.61–1.24)
GFR calc Af Amer: >60 mL/min (ref >60)
GFR calc non Af Amer: >60 mL/min (ref >60)
Glucose, Bld: 140 mg/dL — ABNORMAL HIGH (ref 65–99)
Potassium: 3.3 mmol/L — ABNORMAL LOW (ref 3.5–5.1)
Sodium: 138 mmol/L (ref 135–145)
TOTAL PROTEIN: 7 g/dL (ref 6.5–8.1)
Total Bilirubin: 1.1 mg/dL (ref 0.3–1.2)

## 2014-08-05 LAB — CBC
HCT: 43.5 % (ref 39.0–52.0)
HEMOGLOBIN: 15.1 g/dL (ref 13.0–17.0)
MCH: 32.3 pg (ref 26.0–34.0)
MCHC: 34.7 g/dL (ref 30.0–36.0)
MCV: 92.9 fL (ref 78.0–100.0)
Platelets: 154 10*3/uL (ref 150–400)
RBC: 4.68 MIL/uL (ref 4.22–5.81)
RDW: 12.6 % (ref 11.5–15.5)
WBC: 7.4 10*3/uL (ref 4.0–10.5)

## 2014-08-05 LAB — URINALYSIS, ROUTINE W REFLEX MICROSCOPIC
Bilirubin Urine: NEGATIVE
GLUCOSE, UA: NEGATIVE mg/dL
HGB URINE DIPSTICK: NEGATIVE
KETONES UR: NEGATIVE mg/dL
Leukocytes, UA: NEGATIVE
Nitrite: NEGATIVE
Protein, ur: NEGATIVE mg/dL
Specific Gravity, Urine: 1.012 (ref 1.005–1.030)
UROBILINOGEN UA: 0.2 mg/dL (ref 0.0–1.0)
pH: 5.5 (ref 5.0–8.0)

## 2014-08-05 LAB — DIFFERENTIAL
BASOS ABS: 0 10*3/uL (ref 0.0–0.1)
Basophils Relative: 0 % (ref 0–1)
Eosinophils Absolute: 0 10*3/uL (ref 0.0–0.7)
Eosinophils Relative: 0 % (ref 0–5)
LYMPHS ABS: 0.6 10*3/uL — AB (ref 0.7–4.0)
LYMPHS PCT: 9 % — AB (ref 12–46)
MONO ABS: 1.2 10*3/uL — AB (ref 0.1–1.0)
Monocytes Relative: 17 % — ABNORMAL HIGH (ref 3–12)
Neutro Abs: 5.3 10*3/uL (ref 1.7–7.7)
Neutrophils Relative %: 74 % (ref 43–77)

## 2014-08-05 LAB — LIPASE, BLOOD: LIPASE: 20 U/L — AB (ref 22–51)

## 2014-08-05 LAB — I-STAT TROPONIN, ED: TROPONIN I, POC: 0.01 ng/mL (ref 0.00–0.08)

## 2014-08-05 IMAGING — CT CT ABD-PELV W/ CM
2 of 5 series · 15 of 46 positions shown, 17 images · IV contrast (Omni 300)
Comparison: Several prior exams, most recent [DATE].

CLINICAL DATA: 64-year-old male with abdominal pain and vomiting
since this morning. History of colon cancer ascending colon.
Subsequent encounter.

EXAM:
CT ABDOMEN AND PELVIS WITH CONTRAST
TECHNIQUE: Multidetector CT imaging of the abdomen and pelvis was performed
using the standard protocol following bolus administration of
intravenous contrast.
CONTRAST:  100mL OMNIPAQUE IOHEXOL 300 MG/ML  SOLN

[Series 2: abd/ pelvis 5.0 i30f 1 · axial · 0.77mm/px · z∈[+879,+1329]mm · 12 of 102 slices shown, 14 images]
[im 6/102  soft-tissue]
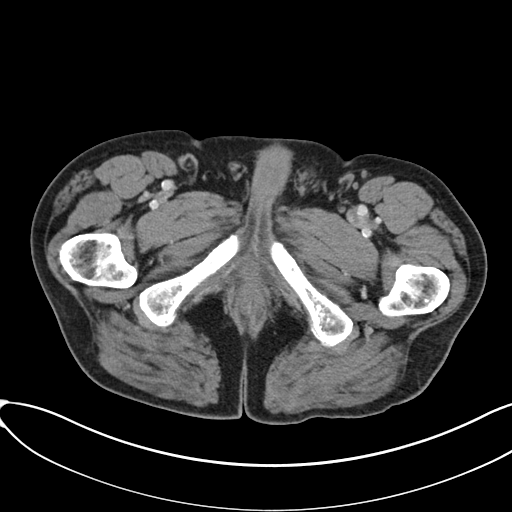
[im 6/102  bone]
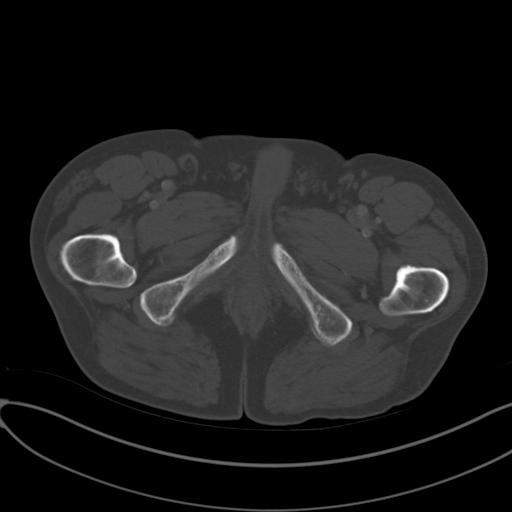
[im 16/102  soft-tissue]
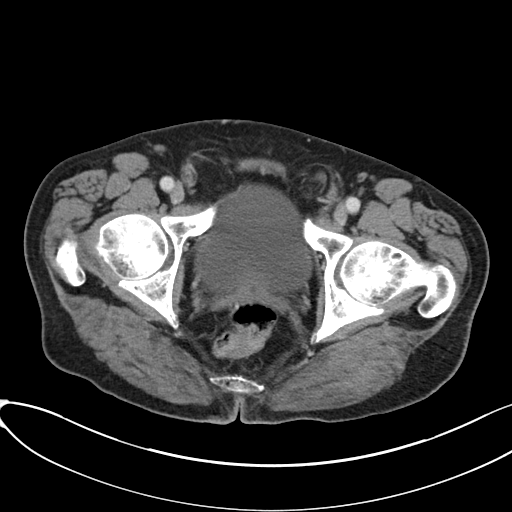
[im 21/102  soft-tissue]
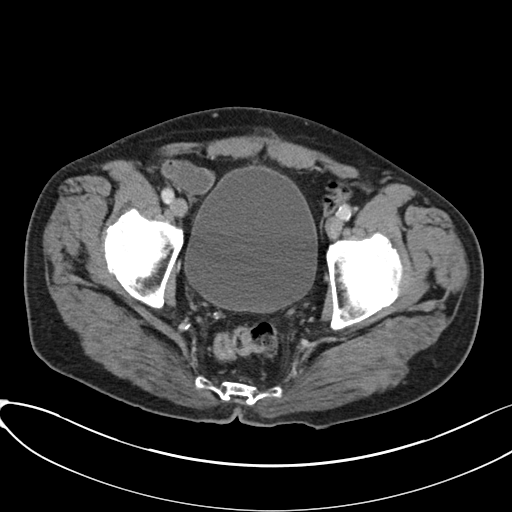
[im 31/102  soft-tissue]
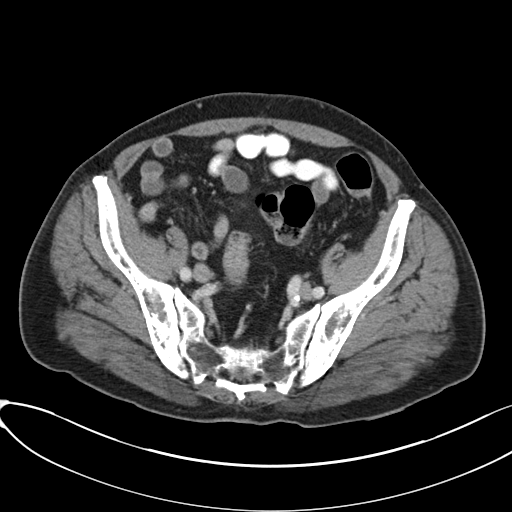
[im 41/102  soft-tissue]
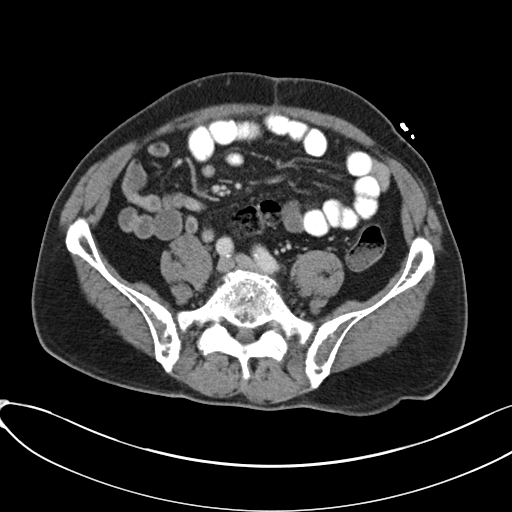
[im 46/102  soft-tissue]
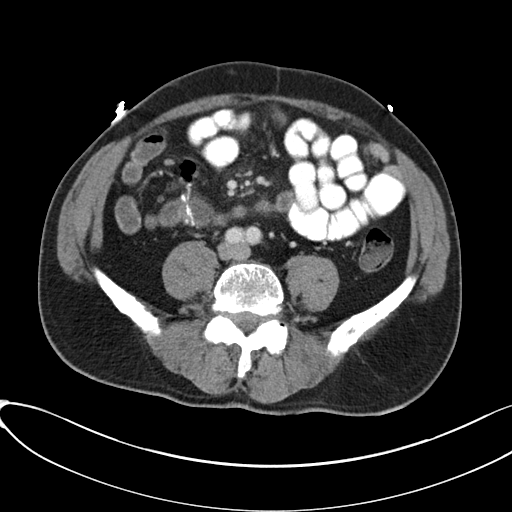
[im 56/102  soft-tissue]
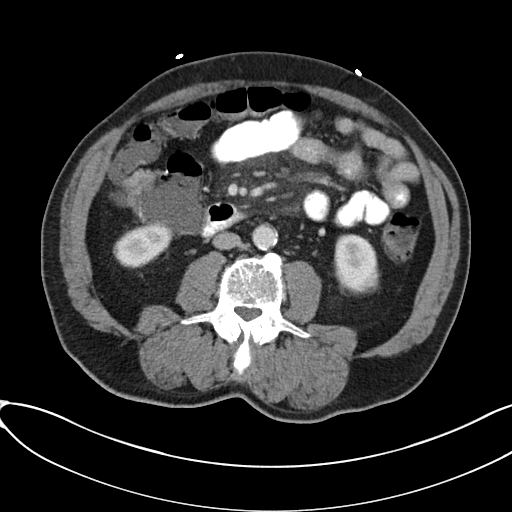
[im 61/102  soft-tissue]
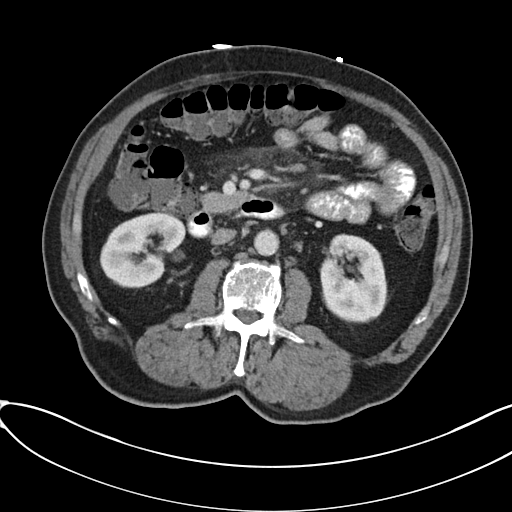
[im 71/102  soft-tissue]
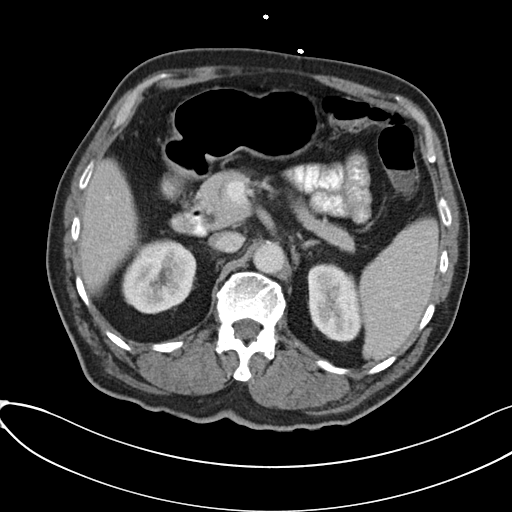
[im 71/102  bone]
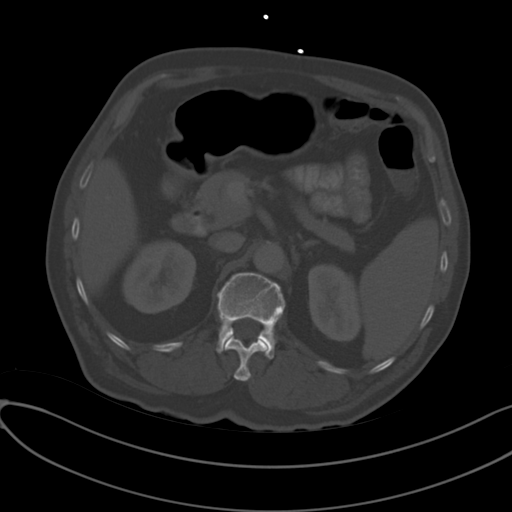
[im 81/102  soft-tissue]
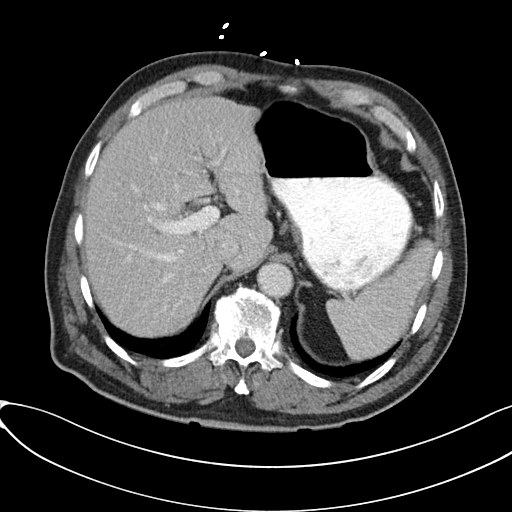
[im 86/102  soft-tissue]
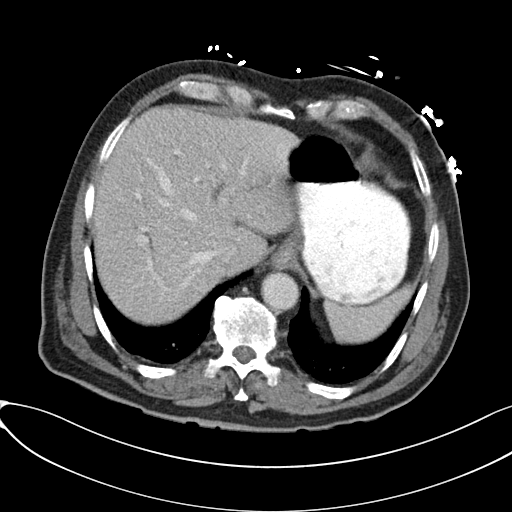
[im 96/102  soft-tissue]
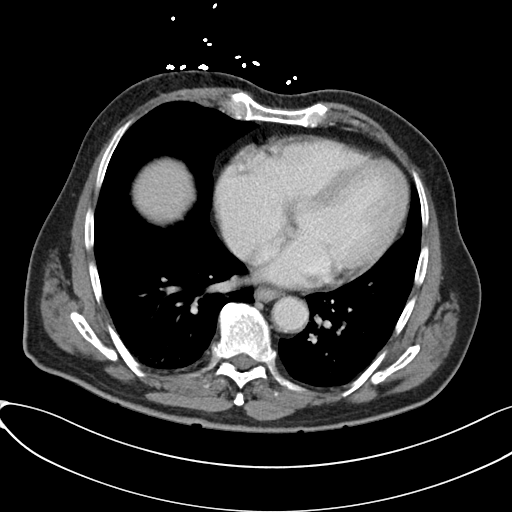

[Series 5: coronals · coronal · 0.73mm/px · 3 of 141 slices shown]
[im 47/141  soft-tissue]
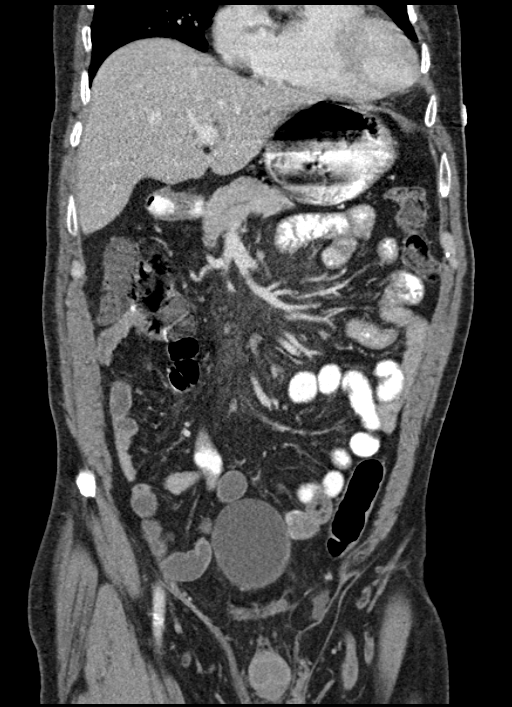
[im 63/141  soft-tissue]
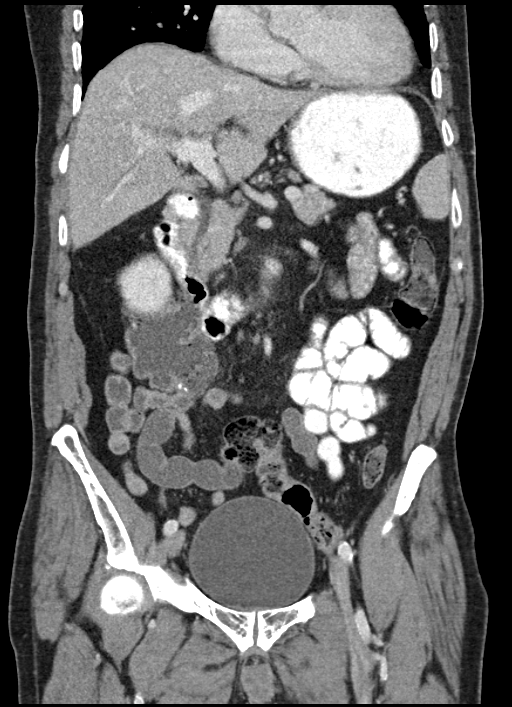
[im 78/141  soft-tissue]
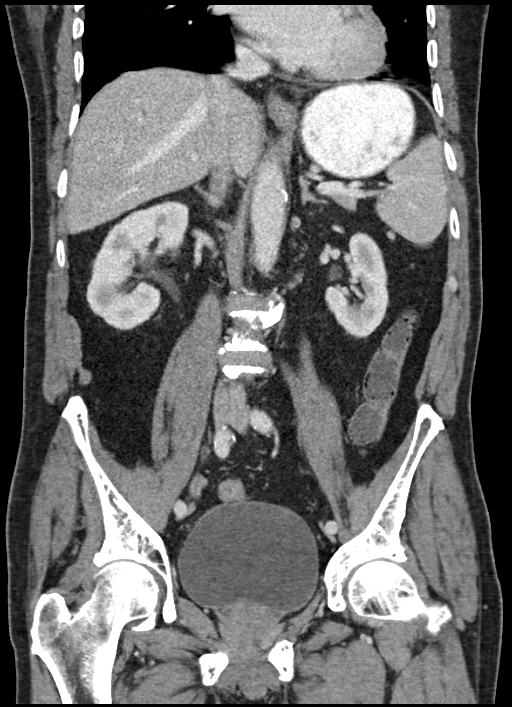

[15 of 46 positions shown; findings below may reference images not displayed]

FINDINGS: Probable lymph node within the inferior aspect of the right major
fissure and 4 mm nodule left lung base unchanged from the [DATE]
exam.

Mild cardiomegaly. Aortic valve calcifications. Mild coronary artery
calcifications.

Atherosclerotic type changes of the abdominal aorta and branch
vessels including iliac arteries without high-grade stenosis or
aneurysm.

Atypical appearance of the iliac wings unchanged from [DATE].
Degenerative changes most notable L4-5 and L5-S1.

Post partial right colectomy without complication of anastomosis.

No bowel obstructing lesion or evidence of free intraperitoneal air.
Bowel extends towards superior aspect of fatty containing
right-sided inguinal hernia and small amount of fluid extends into
fatty containing left inguinal hernia.

Chronic mesenteric stranding/inflammation.

1 cm posterior splenic new enhancing lesion without significant
change from [S6].

Post cholecystectomy. Minimal prominence biliary ducts felt to be
normal post cholecystectomy. No worrisome hepatic, pancreatic,
adrenal or renal lesion.

Non contrast filled views of the urinary bladder unremarkable.

Top-normal slightly enlarged prostate gland.

Scattered small lymph nodes including mesenteric lymph nodes
unchanged.
IMPRESSION: Post partial right colectomy without complication of anastomosis.

No bowel obstructing lesion or evidence of free intraperitoneal air.

Chronic mesenteric stranding/inflammation.

Scattered small lymph nodes including mesenteric lymph nodes
unchanged.

1 cm posterior splenic enhancing lesion without significant change
from [S6].

Post cholecystectomy.

Top-normal slightly enlarged prostate gland.

Probable lymph node within the inferior aspect of the right major
fissure and 4 mm nodule left lung base unchanged from the [DATE]
exam.

Mild cardiomegaly. Aortic valve calcifications. Mild coronary artery
calcifications.

Atherosclerotic type changes of the abdominal aorta and branch
vessels including iliac arteries without high-grade stenosis or
aneurysm.

Atypical appearance of the iliac wings unchanged from [DATE].
Degenerative changes most notable L4-5 and L5-S1.

## 2014-08-05 MED ORDER — IOHEXOL 300 MG/ML  SOLN
25.0000 mL | Freq: Once | INTRAMUSCULAR | Status: AC | PRN
  Administered 2014-08-05: 25 mL via ORAL

## 2014-08-05 MED ORDER — ONDANSETRON HCL 8 MG PO TABS: 8.0000 mg | ORAL_TABLET | Freq: Three times a day (TID) | ORAL | Status: DC | PRN

## 2014-08-05 MED ORDER — POTASSIUM CHLORIDE CRYS ER 20 MEQ PO TBCR
20.0000 meq | EXTENDED_RELEASE_TABLET | Freq: Once | ORAL | Status: AC
  Administered 2014-08-05: 20 meq via ORAL
  Filled 2014-08-05: qty 1

## 2014-08-05 MED ORDER — RANITIDINE HCL 150 MG PO TABS: 150.0000 mg | ORAL_TABLET | Freq: Two times a day (BID) | ORAL | Status: DC

## 2014-08-05 MED ORDER — IOHEXOL 300 MG/ML  SOLN
100.0000 mL | Freq: Once | INTRAMUSCULAR | Status: AC | PRN
  Administered 2014-08-05: 100 mL via INTRAVENOUS

## 2014-08-05 MED ORDER — SODIUM CHLORIDE 0.9 % IV BOLUS (SEPSIS)
1000.0000 mL | Freq: Once | INTRAVENOUS | Status: AC
  Administered 2014-08-05: 1000 mL via INTRAVENOUS

## 2014-08-05 MED ORDER — MORPHINE SULFATE 4 MG/ML IJ SOLN
4.0000 mg | Freq: Once | INTRAMUSCULAR | Status: AC
Start: 2014-08-05 — End: 2014-08-05
  Administered 2014-08-05: 4 mg via INTRAVENOUS
  Filled 2014-08-05: qty 1

## 2014-08-05 MED ORDER — ONDANSETRON HCL 4 MG/2ML IJ SOLN
4.0000 mg | Freq: Once | INTRAMUSCULAR | Status: AC
  Administered 2014-08-05: 4 mg via INTRAVENOUS
  Filled 2014-08-05: qty 2

## 2014-08-05 NOTE — ED Provider Notes (Signed)
CSN: [id number]     Arrival date & time 08/05/14  1021 History   First MD Initiated Contact with Patient 08/05/14 1045     Chief Complaint  Patient presents with  . Abdominal Pain  . Emesis     (Consider location/radiation/quality/duration/timing/severity/associated sxs/prior Treatment) HPI Comments: Alan Riley is a 69 y.o. male with a PMHx of colon cancer and iron deficiency anemia with a PSHx of hemicolectomy and cholecystectomy, who presents to the ED accompanied by his wife who helps with the history, with complaints of upper abdominal pain that began gradually around 10 PM last night. He reports that he had some pain for 2 days, but it worsened last night becoming an 8/10 constant pressure stabbing pain which is nonradiating, worse with vomiting, and with no treatments tried prior to arrival. He reports associated diarrhea last night, 2-3 episodes of watery nonbloody diarrhea, but has not had any passage of flatus or stool today. Additionally reports nausea and 4 episodes of nonbloody nonbilious emesis today. He states this feels very similar to when he had a blockage in December. He denies any fevers, chills, chest pain, shortness breath, constipation, melena, hematochezia, hematemesis, dysuria, hematuria, flank pain, numbness, tingling, weakness, suspicious food intake, alcohol use, antibiotics, or sick contacts. He recently traveled to [place] and [place] 10 days ago, and he takes rather frequent NSAIDs. Drinks coffee twice daily. [facility]. No gastroenterologist.  Patient is a 69 y.o. male presenting with abdominal pain and vomiting. The history is provided by the patient. The history is limited by a language barrier. A language interpreter was used (wife).  Abdominal Pain Pain location:  Epigastric, RUQ and LUQ Pain quality: pressure and stabbing   Pain radiates to:  Does not radiate Pain severity:  Moderate Onset quality:  Gradual Duration:  12  hours Timing:  Constant Progression:  Worsening Chronicity:  Recurrent Context: recent travel   Context: not alcohol use, not sick contacts and not suspicious food intake   Relieved by:  None tried Worsened by:  Vomiting Ineffective treatments:  None tried Associated symptoms: diarrhea (last night, but none today), flatus (no flatus in 12hrs), nausea and vomiting   Associated symptoms: no chest pain, no chills, no constipation, no dysuria, no fever, no hematemesis, no hematochezia, no hematuria, no melena and no shortness of breath   Risk factors: no alcohol abuse and no NSAID use   Emesis Associated symptoms: abdominal pain and diarrhea (last night, but none today)   Associated symptoms: no arthralgias, no chills and no myalgias     Past Medical History  Diagnosis Date  . Malignant neoplasm of ascending colon   . Iron deficiency anemia secondary to blood loss (chronic)   . Hx of adenomatous colonic polyps    Past Surgical History  Procedure Laterality Date  . Hemicolectomy  01/15/03  . Cholecystectomy  1991   Family History  Problem Relation Age of Onset  . Diabetes Mother   . Diabetes Brother    History  Substance Use Topics  . Smoking status: Never Smoker   . Smokeless tobacco: Never Used  . Alcohol Use: Yes     Comment: occassional    Review of Systems  Constitutional: Negative for fever and chills.  Respiratory: Negative for shortness of breath.   Cardiovascular: Negative for chest pain.  Gastrointestinal: Positive for nausea, vomiting, abdominal pain, diarrhea (last night, but none today) and flatus (no flatus in 12hrs). Negative for constipation, blood in stool, melena,  hematochezia and hematemesis.  Genitourinary: Negative for dysuria, hematuria and flank pain.  Musculoskeletal: Negative for myalgias and arthralgias.  Skin: Negative for color change.  Allergic/Immunologic: Negative for immunocompromised state.  Neurological: Negative for weakness and  numbness.  Psychiatric/Behavioral: Negative for confusion.   10 Systems reviewed and are negative for acute change except as noted in the HPI.    Allergies  Review of patient's allergies indicates no known allergies.  Home Medications   Prior to Admission medications   Medication Sig Start Date End Date Taking? Authorizing Provider  acetaminophen (TYLENOL) 325 MG tablet Take 2 tablets (650 mg total) by mouth every 6 (six) hours as needed. 04/25/12   Emina Riebock, NP  atorvastatin (LIPITOR) 20 MG tablet Take 1 tablet (20 mg total) by mouth daily. 11/29/13   Willeen Niece, MD  Cholecalciferol (VITAMIN D PO) Take by mouth.    Historical Provider, MD  fish oil-omega-3 fatty acids 1000 MG capsule Take 2 g by mouth daily.    Historical Provider, MD   BP 127/75 mmHg  Pulse 85  Temp(Src) 98.1 F (36.7 C) (Oral)  Resp 20  Wt 172 lb (78.019 kg)  SpO2 96% Physical Exam  Constitutional: He is oriented to person, place, and time. Vital signs are normal. He appears well-developed and well-nourished.  Non-toxic appearance. No distress.  Afebrile, nontoxic, NAD  HENT:  Head: Normocephalic and atraumatic.  Mouth/Throat: Oropharynx is clear and moist and mucous membranes are normal.  Eyes: Conjunctivae and EOM are normal. Right eye exhibits no discharge. Left eye exhibits no discharge.  Neck: Normal range of motion. Neck supple.  Cardiovascular: Normal rate, regular rhythm, normal heart sounds and intact distal pulses.  Exam reveals no gallop and no friction rub.   No murmur heard. Pulmonary/Chest: Effort normal and breath sounds normal. No respiratory distress. He has no decreased breath sounds. He has no wheezes. He has no rhonchi. He has no rales.  Abdominal: Soft. Normal appearance. He exhibits no distension. Bowel sounds are increased. There is tenderness in the right upper quadrant, epigastric area and left upper quadrant. There is no rigidity, no rebound, no guarding, no CVA tenderness, no  tenderness at McBurney's point and negative Murphy's sign.    Soft, nondistended, +BS throughout which are hyperactive in upper abdomen, and some highpitched tinkling near epigastrum, with TTP in RUQ/epigastrum/LUQ, no r/g/r, neg murphy's, neg mcburney's, no CVA TTP   Musculoskeletal: Normal range of motion.  Neurological: He is alert and oriented to person, place, and time. He has normal strength. No sensory deficit.  Skin: Skin is warm, dry and intact. No rash noted.  Psychiatric: He has a normal mood and affect.  Nursing note and vitals reviewed.   ED Course  Procedures (including critical care time) Labs Review Labs Reviewed  LIPASE, BLOOD - Abnormal; Notable for the following:    Lipase 20 (*)    All other components within normal limits  COMPREHENSIVE METABOLIC PANEL - Abnormal; Notable for the following:    Potassium 3.3 (*)    Glucose, Bld 140 (*)    Calcium 8.5 (*)    All other components within normal limits  DIFFERENTIAL - Abnormal; Notable for the following:    Lymphocytes Relative 9 (*)    Lymphs Abs 0.6 (*)    Monocytes Relative 17 (*)    Monocytes Absolute 1.2 (*)    All other components within normal limits  CBC  URINALYSIS, ROUTINE W REFLEX MICROSCOPIC (NOT AT [facility])  Randolm Idol, ED  Imaging Review Ct Abdomen Pelvis W Contrast  08/05/2014   CLINICAL DATA:  69 year old male with abdominal pain and vomiting since this morning. History of colon cancer ascending colon. Subsequent encounter.  EXAM: CT ABDOMEN AND PELVIS WITH CONTRAST  TECHNIQUE: Multidetector CT imaging of the abdomen and pelvis was performed using the standard protocol following bolus administration of intravenous contrast.  CONTRAST:  173mL OMNIPAQUE IOHEXOL 300 MG/ML  SOLN  COMPARISON:  Several prior exams, most recent 12/30/2013.  FINDINGS: Probable lymph node within the inferior aspect of the right major fissure and 4 mm nodule left lung base unchanged from the 03/07/2012 exam.  Mild  cardiomegaly. Aortic valve calcifications. Mild coronary artery calcifications.  Atherosclerotic type changes of the abdominal aorta and branch vessels including iliac arteries without high-grade stenosis or aneurysm.  Atypical appearance of the iliac wings unchanged from 01/06/2003. Degenerative changes most notable L4-5 and L5-S1.  Post partial right colectomy without complication of anastomosis.  No bowel obstructing lesion or evidence of free intraperitoneal air. Bowel extends towards superior aspect of fatty containing right-sided inguinal hernia and small amount of fluid extends into fatty containing left inguinal hernia.  Chronic mesenteric stranding/inflammation.  1 cm posterior splenic new enhancing lesion without significant change from 2014.  Post cholecystectomy. Minimal prominence biliary ducts felt to be normal post cholecystectomy. No worrisome hepatic, pancreatic, adrenal or renal lesion.  Non contrast filled views of the urinary bladder unremarkable.  Top-normal slightly enlarged prostate gland.  Scattered small lymph nodes including mesenteric lymph nodes unchanged.  IMPRESSION: Post partial right colectomy without complication of anastomosis.  No bowel obstructing lesion or evidence of free intraperitoneal air.  Chronic mesenteric stranding/inflammation.  Scattered small lymph nodes including mesenteric lymph nodes unchanged.  1 cm posterior splenic enhancing lesion without significant change from 2014.  Post cholecystectomy.  Top-normal slightly enlarged prostate gland.  Probable lymph node within the inferior aspect of the right major fissure and 4 mm nodule left lung base unchanged from the 03/07/2012 exam.  Mild cardiomegaly. Aortic valve calcifications. Mild coronary artery calcifications.  Atherosclerotic type changes of the abdominal aorta and branch vessels including iliac arteries without high-grade stenosis or aneurysm.  Atypical appearance of the iliac wings unchanged from 01/06/2003.  Degenerative changes most notable L4-5 and L5-S1.   Electronically Signed   By: Genia Del M.D.   On: 08/05/2014 13:27     EKG Interpretation None      MDM   Final diagnoses:  Abdominal pain  Hypokalemia  Non-intractable vomiting with nausea, vomiting of unspecified type  Diarrhea  Gastritis    69 y.o. male here with upper abd pain and n/v that began last night around 10pm. Had diarrhea yesterday but has not had passage of stool or flatus today. Traveled to [place] and [place] 10 days ago. Has had a blockage before and states this feels similar. On exam, hyperactive BS in upper fields, high pitched tinkling in epigastrum, with tenderness in upper abdomen. Nonperitoneal. Given his hx of colon cancer will obtain CT abd/pelvis to eval for SBO as well as possible mass. Could be gastritis. Will give pain and nausea meds, will get labs, and reassess after labs/imaging.  2:37 PM U/A clear. Trop neg. EKG unremarkable. Lipase WNL. CMP with mildly low K at 3.3, will replete. CBC w/diff unremarkable. CT abd/pelvis delayed resulting, but now returned showing no obstruction, no acute changes. Pt feeling much better, just had a small bowel movement. Symptoms likely could have been from gastritis given his recent increase  in NSAID use. Will PO challenge now and likely d/c with zantac and zofran. Discussed tylenol for pain. Diet modifications discussed. Will reassess after PO challenge completed.  3:12 PM Tolerating PO well. Feels much improved, ready for d/c. Will send home with previously discussed instructions. Will have him f/up with PCP in 1wk. I explained the diagnosis and have given explicit precautions to return to the ER including for any other new or worsening symptoms. The patient understands and accepts the medical plan as it's been dictated and I have answered their questions. Discharge instructions concerning home care and prescriptions have been given. The patient is STABLE and is discharged  to home in good condition.  BP 140/71 mmHg  Pulse 79  Temp(Src) 98.1 F (36.7 C) (Oral)  Resp 16  Wt 172 lb (78.019 kg)  SpO2 95%  Meds ordered this encounter  Medications  . ondansetron (ZOFRAN) injection 4 mg    Sig:   . morphine 4 MG/ML injection 4 mg    Sig:   . sodium chloride 0.9 % bolus 1,000 mL    Sig:   . iohexol (OMNIPAQUE) 300 MG/ML solution 25 mL    Sig:   . iohexol (OMNIPAQUE) 300 MG/ML solution 100 mL    Sig:   . potassium chloride SA (K-DUR,KLOR-CON) CR tablet 20 mEq    Sig:   . ranitidine (ZANTAC) 150 MG tablet    Sig: Take 1 tablet (150 mg total) by mouth 2 (two) times daily.    Dispense:  30 tablet    Refill:  0    Order Specific Question:  Supervising Provider    Answer:  MILLER, BRIAN [[id number]]  . ondansetron (ZOFRAN) 8 MG tablet    Sig: Take 1 tablet (8 mg total) by mouth every 8 (eight) hours as needed for nausea or vomiting.    Dispense:  12 tablet    Refill:  0    Order Specific Question:  Supervising Provider    Answer:  Noemi Chapel [[id number]]     Camyah Pultz Camprubi-Soms, PA-C 08/05/14 1513  Charlesetta Shanks, MD 08/12/14 0000

## 2014-08-05 NOTE — Discharge Instructions (Signed)
Your abdominal pain is likely from gastritis or an ulcer. You will need to take zantac as directed, and avoid spicy/fatty/acidic foods. Avoid coffee on an empty stomach. Avoid laying down flat within 30 minutes of eating. Avoid NSAIDs like ibuprofen on an empty stomach. Use tylenol as needed for pain. Use zofran as needed for nausea. Stay well hydrated with small sips of fluids throughout the day. Follow a BRAT (banana-rice-applesauce-toast) diet as described below for the next 24-48 hours. The 'BRAT' diet is suggested, then progress to diet as tolerated as symptoms abate. Call your doctor if bloody stools, persistent diarrhea, vomiting, fever, or worsening abdominal pain. Follow up with your regular doctor in one week for ongoing evaluation of your abdominal pain. Return to the ER for changes or worsening symptoms.   Abdominal (belly) pain can be caused by many things. Your caregiver performed an examination and possibly ordered blood/urine tests and imaging (CT scan, x-rays, ultrasound). Many cases can be observed and treated at home after initial evaluation in the emergency department. Even though you are being discharged home, abdominal pain can be unpredictable. Therefore, you need a repeated exam if your pain does not resolve, returns, or worsens. Most patients with abdominal pain don't have to be admitted to the hospital or have surgery, but serious problems like appendicitis and gallbladder attacks can start out as nonspecific pain. Many abdominal conditions cannot be diagnosed in one visit, so follow-up evaluations are very important. SEEK IMMEDIATE MEDICAL ATTENTION IF YOU DEVELOP ANY OF THE FOLLOWING SYMPTOMS:  The pain does not go away or becomes severe.   A temperature above 101 develops.   Repeated vomiting occurs (multiple episodes).   The pain becomes localized to portions of the abdomen. The right side could possibly be appendicitis. In an adult, the left lower portion of the abdomen  could be colitis or diverticulitis.   Blood is being passed in stools or vomit (bright red or black tarry stools).   Return also if you develop chest pain, difficulty breathing, dizziness or fainting, or become confused, poorly responsive, or inconsolable (young children).  The constipation stays for more than 4 days.   There is belly (abdominal) or rectal pain.   You do not seem to be getting better.    Food Choices to Help Relieve Diarrhea When you have diarrhea, the foods you eat and your eating habits are very important. Choosing the right foods and drinks can help relieve diarrhea. Also, because diarrhea can last up to 7 days, you need to replace lost fluids and electrolytes (such as sodium, potassium, and chloride) in order to help prevent dehydration.  WHAT GENERAL GUIDELINES DO I NEED TO FOLLOW?  Slowly drink 1 cup (8 oz) of fluid for each episode of diarrhea. If you are getting enough fluid, your urine will be clear or pale yellow.  Eat starchy foods. Some good choices include white rice, white toast, pasta, low-fiber cereal, baked potatoes (without the skin), saltine crackers, and bagels.  Avoid large servings of any cooked vegetables.  Limit fruit to two servings per day. A serving is  cup or 1 small piece.  Choose foods with less than 2 g of fiber per serving.  Limit fats to less than 8 tsp (38 g) per day.  Avoid fried foods.  Eat foods that have probiotics in them. Probiotics can be found in certain dairy products.  Avoid foods and beverages that may increase the speed at which food moves through the stomach and intestines (gastrointestinal tract).  Things to avoid include:  High-fiber foods, such as dried fruit, raw fruits and vegetables, nuts, seeds, and whole grain foods.  Spicy foods and high-fat foods.  Foods and beverages sweetened with high-fructose corn syrup, honey, or sugar alcohols such as xylitol, sorbitol, and mannitol. WHAT FOODS ARE  RECOMMENDED? Grains White rice. White, [place], or pita breads (fresh or toasted), including plain rolls, buns, or bagels. White pasta. Saltine, soda, or graham crackers. Pretzels. Low-fiber cereal. Cooked cereals made with water (such as cornmeal, farina, or cream cereals). Plain muffins. Matzo. Melba toast. Zwieback.  Vegetables Potatoes (without the skin). Strained tomato and vegetable juices. Most well-cooked and canned vegetables without seeds. Tender lettuce. Fruits Cooked or canned applesauce, apricots, cherries, fruit cocktail, grapefruit, peaches, pears, or plums. Fresh bananas, apples without skin, cherries, grapes, cantaloupe, grapefruit, peaches, oranges, or plums.  Meat and Other Protein Products Baked or boiled chicken. Eggs. Tofu. Fish. Seafood. Smooth peanut butter. Ground or well-cooked tender beef, ham, veal, lamb, pork, or poultry.  Dairy Plain yogurt, kefir, and unsweetened liquid yogurt. Lactose-free milk, buttermilk, or soy milk. Plain hard cheese. Beverages Sport drinks. Clear broths. Diluted fruit juices (except prune). Regular, caffeine-free sodas such as ginger ale. Water. Decaffeinated teas. Oral rehydration solutions. Sugar-free beverages not sweetened with sugar alcohols. Other Bouillon, broth, or soups made from recommended foods.  The items listed above may not be a complete list of recommended foods or beverages. Contact your dietitian for more options. WHAT FOODS ARE NOT RECOMMENDED? Grains Whole grain, whole wheat, bran, or rye breads, rolls, pastas, crackers, and cereals. Wild or brown rice. Cereals that contain more than 2 g of fiber per serving. Corn tortillas or taco shells. Cooked or dry oatmeal. Granola. Popcorn. Vegetables Raw vegetables. Cabbage, broccoli, Brussels sprouts, artichokes, baked beans, beet greens, corn, kale, legumes, peas, sweet potatoes, and yams. Potato skins. Cooked spinach and cabbage. Fruits Dried fruit, including raisins and dates.  Raw fruits. Stewed or dried prunes. Fresh apples with skin, apricots, mangoes, pears, raspberries, and strawberries.  Meat and Other Protein Products Chunky peanut butter. Nuts and seeds. Beans and lentils. Berniece Salines.  Dairy High-fat cheeses. Milk, chocolate milk, and beverages made with milk, such as milk shakes. Cream. Ice cream. Sweets and Desserts Sweet rolls, doughnuts, and sweet breads. Pancakes and waffles. Fats and Oils Butter. Cream sauces. Margarine. Salad oils. Plain salad dressings. Olives. Avocados.  Beverages Caffeinated beverages (such as coffee, tea, soda, or energy drinks). Alcoholic beverages. Fruit juices with pulp. Prune juice. Soft drinks sweetened with high-fructose corn syrup or sugar alcohols. Other Coconut. Hot sauce. Chili powder. Mayonnaise. Gravy. Cream-based or milk-based soups.  The items listed above may not be a complete list of foods and beverages to avoid. Contact your dietitian for more information. WHAT SHOULD I DO IF I BECOME DEHYDRATED? Diarrhea can sometimes lead to dehydration. Signs of dehydration include dark urine and dry mouth and skin. If you think you are dehydrated, you should rehydrate with an oral rehydration solution. These solutions can be purchased at pharmacies, retail stores, or online.  Drink -1 cup (120-240 mL) of oral rehydration solution each time you have an episode of diarrhea. If drinking this amount makes your diarrhea worse, try drinking smaller amounts more often. For example, drink 1-3 tsp (5-15 mL) every 5-10 minutes.  A general rule for staying hydrated is to drink 1-2 L of fluid per day. Talk to your health care provider about the specific amount you should be drinking each day. Drink enough fluids to keep  your urine clear or pale yellow. Document Released: 04/02/2003 Document Revised: 01/15/2013 Document Reviewed: 12/03/2012 [facility] Patient Information 2015 [place], [place]. This information is not intended to replace advice given  to you by your health care provider. Make sure you discuss any questions you have with your health care provider.   Gastritis, Adult Gastritis is soreness and puffiness (inflammation) of the lining of the stomach. If you do not get help, gastritis can cause bleeding and sores (ulcers) in the stomach. HOME CARE   Only take medicine as told by your doctor.  If you were given antibiotic medicines, take them as told. Finish the medicines even if you start to feel better.  Drink enough fluids to keep your pee (urine) clear or pale yellow.  Avoid foods and drinks that make your problems worse. Foods you may want to avoid include:  Caffeine or alcohol.  Chocolate.  Mint.  Garlic and onions.  Spicy foods.  Citrus fruits, including oranges, lemons, or limes.  Food containing tomatoes, including sauce, chili, salsa, and pizza.  Fried and fatty foods.  Eat small meals throughout the day instead of large meals. GET HELP RIGHT AWAY IF:   You have black or dark red poop (stools).  You throw up (vomit) blood. It may look like coffee grounds.  You cannot keep fluids down.  Your belly (abdominal) pain gets worse.  You have a fever.  You do not feel better after 1 week.  You have any other questions or concerns. MAKE SURE YOU:   Understand these instructions.  Will watch your condition.  Will get help right away if you are not doing well or get worse. Document Released: 06/29/2007 Document Revised: 04/04/2011 Document Reviewed: 02/23/2011 [facility] Patient Information 2015 [place], [place]. This information is not intended to replace advice given to you by your health care provider. Make sure you discuss any questions you have with your health care provider.  Nausea, Adult Nausea means you feel sick to your stomach or need to throw up (vomit). It may be a sign of a more serious problem. If nausea gets worse, you may throw up. If you throw up a lot, you may lose too much body  fluid (dehydration). HOME CARE   Get plenty of rest.  Ask your doctor how to replace body fluid losses (rehydrate).  Eat small amounts of food. Sip liquids more often.  Take all medicines as told by your doctor. GET HELP RIGHT AWAY IF:  You have a fever.  You pass out (faint).  You keep throwing up or have blood in your throw up.  You are very weak, have dry lips or a dry mouth, or you are very thirsty (dehydrated).  You have dark or bloody poop (stool).  You have very bad chest or belly (abdominal) pain.  You do not get better after 2 days, or you get worse.  You have a headache. MAKE SURE YOU:  Understand these instructions.  Will watch your condition.  Will get help right away if you are not doing well or get worse. Document Released: 12/30/2010 Document Revised: 04/04/2011 Document Reviewed: 12/30/2010 [facility] Patient Information 2015 [place], [place]. This information is not intended to replace advice given to you by your health care provider. Make sure you discuss any questions you have with your health care provider.  Abdominal Pain Many things can cause belly (abdominal) pain. Most times, the belly pain is not dangerous. Many cases of belly pain can be watched and treated at home. HOME  CARE   Do not take medicines that help you go poop (laxatives) unless told to by your doctor.  Only take medicine as told by your doctor.  Eat or drink as told by your doctor. Your doctor will tell you if you should be on a special diet. GET HELP IF:  You do not know what is causing your belly pain.  You have belly pain while you are sick to your stomach (nauseous) or have runny poop (diarrhea).  You have pain while you pee or poop.  Your belly pain wakes you up at night.  You have belly pain that gets worse or better when you eat.  You have belly pain that gets worse when you eat fatty foods.  You have a fever. GET HELP RIGHT AWAY IF:   The pain does not go away  within 2 hours.  You keep throwing up (vomiting).  The pain changes and is only in the right or left part of the belly.  You have bloody or tarry looking poop. MAKE SURE YOU:   Understand these instructions.  Will watch your condition.  Will get help right away if you are not doing well or get worse. Document Released: 06/29/2007 Document Revised: 01/15/2013 Document Reviewed: 09/19/2012 [facility] Patient Information 2015 [place], [place]. This information is not intended to replace advice given to you by your health care provider. Make sure you discuss any questions you have with your health care provider.  Diarrhea Diarrhea is watery poop (stool). It can make you feel weak, tired, thirsty, or give you a dry mouth (signs of dehydration). Watery poop is a sign of another problem, most often an infection. It often lasts 2-3 days. It can last longer if it is a sign of something serious. Take care of yourself as told by your doctor. HOME CARE   Drink 1 cup (8 ounces) of fluid each time you have watery poop.  Do not drink the following fluids:  Those that contain simple sugars (fructose, glucose, galactose, lactose, sucrose, maltose).  Sports drinks.  Fruit juices.  Whole milk products.  Sodas.  Drinks with caffeine (coffee, tea, soda) or alcohol.  Oral rehydration solution may be used if the doctor says it is okay. You may make your own solution. Follow this recipe:   - teaspoon table salt.   teaspoon baking soda.   teaspoon salt substitute containing potassium chloride.  1 tablespoons sugar.  1 liter (34 ounces) of water.  Avoid the following foods:  High fiber foods, such as raw fruits and vegetables.  Nuts, seeds, and whole grain breads and cereals.   Those that are sweetened with sugar alcohols (xylitol, sorbitol, mannitol).  Try eating the following foods:  Starchy foods, such as rice, toast, pasta, low-sugar cereal, oatmeal, baked potatoes, crackers, and  bagels.  Bananas.  Applesauce.  Eat probiotic-rich foods, such as yogurt and milk products that are fermented.  Wash your hands well after each time you have watery poop.  Only take medicine as told by your doctor.  Take a warm bath to help lessen burning or pain from having watery poop. GET HELP RIGHT AWAY IF:   You cannot drink fluids without throwing up (vomiting).  You keep throwing up.  You have blood in your poop, or your poop looks black and tarry.  You do not pee (urinate) in 6-8 hours, or there is only a small amount of very dark pee.  You have belly (abdominal) pain that gets worse or stays in  the same spot (localizes).  You are weak, dizzy, confused, or light-headed.  You have a very bad headache.  Your watery poop gets worse or does not get better.  You have a fever or lasting symptoms for more than 2-3 days.  You have a fever and your symptoms suddenly get worse. MAKE SURE YOU:   Understand these instructions.  Will watch your condition.  Will get help right away if you are not doing well or get worse. Document Released: 06/29/2007 Document Revised: 05/27/2013 Document Reviewed: 09/18/2011 [facility] Patient Information 2015 [place], [place]. This information is not intended to replace advice given to you by your health care provider. Make sure you discuss any questions you have with your health care provider.    Potassium Content of Foods Potassium is a mineral found in many foods and drinks. It helps keep fluids and minerals balanced in your body and affects how steadily your heart beats. Potassium also helps control your blood pressure and keep your muscles and nervous system healthy. Certain health conditions and medicines may change the balance of potassium in your body. When this happens, you can help balance your level of potassium through the foods that you do or do not eat. Your health care provider or dietitian may recommend an amount of potassium  that you should have each day. The following lists of foods provide the amount of potassium (in parentheses) per serving in each item. HIGH IN POTASSIUM  The following foods and beverages have 200 mg or more of potassium per serving:  Apricots, 2 raw or 5 dry (200 mg).  Artichoke, 1 medium (345 mg).  Avocado, raw,  each (245 mg).  Banana, 1 medium (425 mg).  Beans, lima, or baked beans, canned,  cup (280 mg).  Beans, white, canned,  cup (595 mg).  Beef roast, 3 oz (320 mg).  Beef, ground, 3 oz (270 mg).  Beets, raw or cooked,  cup (260 mg).  Bran muffin, 2 oz (300 mg).  Broccoli,  cup (230 mg).  Brussels sprouts,  cup (250 mg).  Cantaloupe,  cup (215 mg).  Cereal, 100% bran,  cup (200-400 mg).  Cheeseburger, single, fast food, 1 each (225-400 mg).  Chicken, 3 oz (220 mg).  Clams, canned, 3 oz (535 mg).  Crab, 3 oz (225 mg).  Dates, 5 each (270 mg).  Dried beans and peas,  cup (300-475 mg).  Figs, dried, 2 each (260 mg).  Fish: halibut, tuna, cod, snapper, 3 oz (480 mg).  Fish: salmon, haddock, swordfish, perch, 3 oz (300 mg).  Fish, tuna, canned 3 oz (200 mg).  [place] fries, fast food, 3 oz (470 mg).  Granola with fruit and nuts,  cup (200 mg).  Grapefruit juice,  cup (200 mg).  Greens, beet,  cup (655 mg).  Honeydew melon,  cup (200 mg).  Kale, raw, 1 cup (300 mg).  Kiwi, 1 medium (240 mg).  Kohlrabi, rutabaga, parsnips,  cup (280 mg).  Lentils,  cup (365 mg).  Mango, 1 each (325 mg).  Milk, chocolate, 1 cup (420 mg).  Milk: nonfat, low-fat, whole, buttermilk, 1 cup (350-380 mg).  Molasses, 1 Tbsp (295 mg).  Mushrooms,  cup (280) mg.  Nectarine, 1 each (275 mg).  Nuts: almonds, peanuts, hazelnuts, [place], cashew, mixed, 1 oz (200 mg).  Nuts, pistachios, 1 oz (295 mg).  Orange, 1 each (240 mg).  Orange juice,  cup (235 mg).  Papaya, medium,  fruit (390 mg).  Peanut butter, chunky, 2 Tbsp (240  mg).  Peanut  butter, smooth, 2 Tbsp (210 mg).  Pear, 1 medium (200 mg).  Pomegranate, 1 whole (400 mg).  Pomegranate juice,  cup (215 mg).  Pork, 3 oz (350 mg).  Potato chips, salted, 1 oz (465 mg).  Potato, baked with skin, 1 medium (925 mg).  Potatoes, boiled,  cup (255 mg).  Potatoes, mashed,  cup (330 mg).  Prune juice,  cup (370 mg).  Prunes, 5 each (305 mg).  Pudding, chocolate,  cup (230 mg).  Pumpkin, canned,  cup (250 mg).  Raisins, seedless,  cup (270 mg).  Seeds, sunflower or pumpkin, 1 oz (240 mg).  Soy milk, 1 cup (300 mg).  Spinach,  cup (420 mg).  Spinach, canned,  cup (370 mg).  Sweet potato, baked with skin, 1 medium (450 mg).  Swiss chard,  cup (480 mg).  Tomato or vegetable juice,  cup (275 mg).  Tomato sauce or puree,  cup (400-550 mg).  Tomato, raw, 1 medium (290 mg).  Tomatoes, canned,  cup (200-300 mg).  [place], 3 oz (250 mg).  Wheat germ, 1 oz (250 mg).  Winter squash,  cup (250 mg).  Yogurt, plain or fruited, 6 oz (260-435 mg).  Zucchini,  cup (220 mg). MODERATE IN POTASSIUM The following foods and beverages have 50-200 mg of potassium per serving:  Apple, 1 each (150 mg).  Apple juice,  cup (150 mg).  Applesauce,  cup (90 mg).  Apricot nectar,  cup (140 mg).  Asparagus, small spears,  cup or 6 spears (155 mg).  Bagel, cinnamon raisin, 1 each (130 mg).  Bagel, egg or plain, 4 in., 1 each (70 mg).  Beans, green,  cup (90 mg).  Beans, yellow,  cup (190 mg).  Beer, regular, 12 oz (100 mg).  Beets, canned,  cup (125 mg).  Blackberries,  cup (115 mg).  Blueberries,  cup (60 mg).  Bread, whole wheat, 1 slice (70 mg).  Broccoli, raw,  cup (145 mg).  Cabbage,  cup (150 mg).  Carrots, cooked or raw,  cup (180 mg).  Cauliflower, raw,  cup (150 mg).  Celery, raw,  cup (155 mg).  Cereal, bran flakes, cup (120-150 mg).  Cheese, cottage,  cup (110 mg).  Cherries, 10 each (150  mg).  Chocolate, 1 oz bar (165 mg).  Coffee, brewed 6 oz (90 mg).  Corn,  cup or 1 ear (195 mg).  Cucumbers,  cup (80 mg).  Egg, large, 1 each (60 mg).  Eggplant,  cup (60 mg).  Endive, raw, cup (80 mg).  English muffin, 1 each (65 mg).  Fish, orange roughy, 3 oz (150 mg).  Frankfurter, beef or pork, 1 each (75 mg).  Fruit cocktail,  cup (115 mg).  Grape juice,  cup (170 mg).  Grapefruit,  fruit (175 mg).  Grapes,  cup (155 mg).  Greens: kale, turnip, collard,  cup (110-150 mg).  Ice cream or frozen yogurt, chocolate,  cup (175 mg).  Ice cream or frozen yogurt, vanilla,  cup (120-150 mg).  Lemons, limes, 1 each (80 mg).  Lettuce, all types, 1 cup (100 mg).  Mixed vegetables,  cup (150 mg).  Mushrooms, raw,  cup (110 mg).  Nuts: walnuts, pecans, or macadamia, 1 oz (125 mg).  Oatmeal,  cup (80 mg).  Okra,  cup (110 mg).  Onions, raw,  cup (120 mg).  Peach, 1 each (185 mg).  Peaches, canned,  cup (120 mg).  Pears, canned,  cup (120 mg).  Peas, green, frozen,  cup (90 mg).  Peppers, green,  cup (130 mg).  Peppers, red,  cup (160 mg).  Pineapple juice,  cup (165 mg).  Pineapple, fresh or canned,  cup (100 mg).  Plums, 1 each (105 mg).  Pudding, vanilla,  cup (150 mg).  Raspberries,  cup (90 mg).  Rhubarb,  cup (115 mg).  Rice, wild,  cup (80 mg).  Shrimp, 3 oz (155 mg).  Spinach, raw, 1 cup (170 mg).  Strawberries,  cup (125 mg).  Summer squash  cup (175-200 mg).  Swiss chard, raw, 1 cup (135 mg).  Tangerines, 1 each (140 mg).  Tea, brewed, 6 oz (65 mg).  Turnips,  cup (140 mg).  Watermelon,  cup (85 mg).  Wine, red, table, 5 oz (180 mg).  Wine, white, table, 5 oz (100 mg). LOW IN POTASSIUM The following foods and beverages have less than 50 mg of potassium per serving.  Bread, white, 1 slice (30 mg).  Carbonated beverages, 12 oz (less than 5 mg).  Cheese, 1 oz (20-30  mg).  Cranberries,  cup (45 mg).  Cranberry juice cocktail,  cup (20 mg).  Fats and oils, 1 Tbsp (less than 5 mg).  Hummus, 1 Tbsp (32 mg).  Nectar: papaya, mango, or pear,  cup (35 mg).  Rice, white or brown,  cup (50 mg).  Spaghetti or macaroni,  cup cooked (30 mg).  Tortilla, flour or corn, 1 each (50 mg).  Waffle, 4 in., 1 each (50 mg).  Water chestnuts,  cup (40 mg). Document Released: 08/24/2004 Document Revised: 01/15/2013 Document Reviewed: 12/07/2012 [facility] Patient Information 2015 [place], [place]. This information is not intended to replace advice given to you by your health care provider. Make sure you discuss any questions you have with your health care provider.

## 2014-08-05 NOTE — Progress Notes (Addendum)
[facility] ED CM received a call transferred from [facility] ED.  On the line was Ardana, pt's family member stating that [facility] without speaking with a doctor CM obtained at return number for her as [phone] CM reviewed EPIC d/c orders noting pt was ordered zofran 8 mg 1 tablet by mouth every 8 hours prn for nausea or vomiting CM called and consulted with Dr Frederich Cha order provided to change zofran from 8 mg to 4 mg 1 tablet by mouth every 8 hours prn for nausea or vomiting Cm spoke with [facility], pharmacy at [phone] to call in new order for zofran 4 mg 1 tablet by mouth every 8 hours prn for nausea or vomiting 2010 Gwyndolyn Saxon unable to get zofran 4 mg to process Noted rejection per [facility] pharmacist who questions if pt has had this rx filled recently and is at the quantity limit  2014 spoke with Dr Vanita Panda at [facility] ED x [phone]  2028 [facility] a [facility] to [facility] to try recommended zofran 4 mg ODT Spoke with Gwyndolyn Saxon at [phone]  2033 Spoke with EDP Vanita Panda at [facility] ED x [phone] about pt medication issues  2048 spoke with Dr Vanita Panda at [facility] ED who recommends CM contact pcp via in basket to assist pt on 08/06/14  CM sent an in basket message to Dr Kennith Maes explaining the pt unable to have zofran preauthorized on 08/05/14 via EPIC Pending response [id number] CM called [phone] and spoke with wife to inform her that [facility] with attempts to change zofran but each time [facility] unable to process order and EDP prefers pre authorization from pcp, pcp sent update and pending response Wife voices understanding Cm ask to speak with daughter to attempt explain to her also since wife voices understanding some English. Daughter not present per wife

## 2014-08-05 NOTE — ED Notes (Signed)
Pt. Stated, I started having stomach pain and vomiting . Wife stated, he drank some coffee this morning and it comes up.  In December he had a blockage and its like the same.

## 2014-08-07 ENCOUNTER — Other Ambulatory Visit: Payer: Self-pay | Admitting: Obstetrics and Gynecology

## 2014-08-07 MED ORDER — ONDANSETRON HCL 4 MG PO TABS: 8.0000 mg | ORAL_TABLET | Freq: Three times a day (TID) | ORAL | Status: DC | PRN

## 2014-08-07 NOTE — Progress Notes (Signed)
[facility] ED Cm received a call from Dr Gerarda Fraction who is now the assigned pcp for pt vs Dr Awanda Mink.  CM reviewed the concerns voiced from [facility] pharmacist, Gwyndolyn Saxon about pt zofran on 08/05/14, the attempts to change zofran doses amount and type and EDPs not wanting to change med to phenergan related to pt age   Dr Gerarda Fraction requests if any forms arrive for pre authorization etc to contact her at [phone]

## 2014-08-11 ENCOUNTER — Encounter: Payer: Self-pay | Admitting: Family Medicine

## 2014-08-11 ENCOUNTER — Ambulatory Visit (INDEPENDENT_AMBULATORY_CARE_PROVIDER_SITE_OTHER): Payer: No Typology Code available for payment source | Admitting: Family Medicine

## 2014-08-11 VITALS — BP 127/69 | HR 58 | Temp 98.1°F | Wt 177.0 lb

## 2014-08-11 DIAGNOSIS — R112 Nausea with vomiting, unspecified: Secondary | ICD-10-CM

## 2014-08-11 DIAGNOSIS — R1084 Generalized abdominal pain: Secondary | ICD-10-CM | POA: Diagnosis not present

## 2014-08-11 MED ORDER — PROMETHAZINE HCL 12.5 MG PO TABS: 12.5000 mg | ORAL_TABLET | Freq: Three times a day (TID) | ORAL | Status: DC | PRN

## 2014-08-11 NOTE — Patient Instructions (Signed)
Dear Alan Riley, Thank you for coming in to clinic today.  1. I'm glad you are feeling better, since 08/05/14 when you visited Emergency Room. 2. I have no concerns today. You are eating, drinking, having bowel movements and no abdominal pain. 3. Rx Phenergan take one tablet every 8 hours as needed for nausea and vomiting (IF you get sick again). However, if you need to take this medicine, important to call our office and follow-up with doctor or if worse go to ED again.  Your new Primary Doctor - Dr. Luiz Blare  Please schedule a follow-up appointment with Dr. Gerarda Fraction as needed within next 1 to 3 months  If you have any other questions or concerns, please feel free to call the clinic to contact me. You may also schedule an earlier appointment if necessary.  However, if your symptoms get significantly worse, please go to the Emergency Department to seek immediate medical attention.  [name], [place]

## 2014-08-11 NOTE — Progress Notes (Signed)
   Subjective:    Patient ID: Alan Riley, male    DOB: 1945/04/22, 69 y.o.   MRN: [id number]  History provided by both patient and wife.  HPI  ABDOMINAL PAIN, ED FOLLOW-UP: - Known significant PMH colon cancer s/p hemicolectomy and cholecystectomy - Presents today for ED follow-up from visit on 08/05/14 at [facility] for abdominal with vomiting, at that time was acute worsening pain within 24 hrs, previously present x 2 days, mostly constant 8/10 stabbing, some diarrhea and emesis initially then constipated without passing stool or minimal flatus. Concern for SBO given h/o colon CA. Labs and CT Abd/Pelvis obtained, essentially normal without evidence of obstruction, clinically improved and had small BM. Discharged home. - Currently doing well. No complaints. Symptoms had resolved soon after 7/12. No recurrence. Tolerating PO well. Regular BMs. Back to normal activity. Trying to improve diet. Took some Zantac given by ED with improve, now stopped taking. Never filled Zofran due to cost. - Denies any abdominal pain currently, nausea, vomiting, diarrhea, constipation, fevers/chills, CP, SOB  I have reviewed and updated the following as appropriate: allergies and current medications  Social Hx: - Never smoker  Review of Systems  See above HPI    Objective:   Physical Exam  BP 127/69 mmHg  Pulse 58  Temp(Src) 98.1 F (36.7 C) (Oral)  Wt 177 lb (80.287 kg)  Gen - elderly 14 yr M, currently well-appearing, comfortable and cooperative, NAD HEENT - oropharynx clear, MMM Heart - RRR, no murmurs heard Lungs - CTAB, no wheezing, crackles, or rhonchi. Normal work of breathing. Abd - soft, NTND, no rebound or guarding, no masses, +active BS. Well healed >10 yr old abd vertical incision Ext - non-tender, no edema, peripheral pulses intact +2 b/l Skin - warm, dry, no rashes     Assessment & Plan:   See specific A&P problem list for details.

## 2014-10-22 ENCOUNTER — Other Ambulatory Visit: Payer: Self-pay | Admitting: Family Medicine

## 2014-10-22 ENCOUNTER — Other Ambulatory Visit: Payer: Self-pay

## 2014-10-23 LAB — CMP12+LP+TP+TSH+6AC+PSA+CBC…
ALBUMIN: 4.4 g/dL (ref 3.6–4.8)
ALT: 23 IU/L (ref 0–44)
AST: 23 IU/L (ref 0–40)
Albumin/Globulin Ratio: 1.8 (ref 1.1–2.5)
Alkaline Phosphatase: 63 IU/L (ref 39–117)
BUN/Creatinine Ratio: 11 (ref 10–22)
BUN: 8 mg/dL (ref 8–27)
Basophils Absolute: 0 10*3/uL (ref 0.0–0.2)
Basos: 0 %
Bilirubin Total: 0.8 mg/dL (ref 0.0–1.2)
CHOLESTEROL TOTAL: 141 mg/dL (ref 100–199)
CREATININE: 0.76 mg/dL (ref 0.76–1.27)
Calcium: 9.3 mg/dL (ref 8.6–10.2)
Chloride: 100 mmol/L (ref 97–108)
Chol/HDL Ratio: 4.4 ratio units (ref 0.0–5.0)
EOS (ABSOLUTE): 0 10*3/uL (ref 0.0–0.4)
Eos: 0 %
Estimated CHD Risk: 0.9 times avg. (ref 0.0–1.0)
FREE THYROXINE INDEX: 1.8 (ref 1.2–4.9)
GFR calc Af Amer: 108 mL/min/{1.73_m2} (ref >59)
GFR calc non Af Amer: 94 mL/min/{1.73_m2} (ref >59)
GGT: 55 IU/L (ref 0–65)
GLOBULIN, TOTAL: 2.5 g/dL (ref 1.5–4.5)
Glucose: 117 mg/dL — ABNORMAL HIGH (ref 65–99)
HDL: 32 mg/dL — ABNORMAL LOW (ref >39)
Hematocrit: 42.8 % (ref 37.5–51.0)
Hemoglobin: 14.7 g/dL (ref 12.6–17.7)
IMMATURE GRANS (ABS): 0 10*3/uL (ref 0.0–0.1)
IRON: 103 ug/dL (ref 38–169)
Immature Granulocytes: 0 %
LDH: 185 IU/L (ref 121–224)
LDL Calculated: 63 mg/dL (ref 0–99)
LYMPHS: 19 %
Lymphocytes Absolute: 1.3 10*3/uL (ref 0.7–3.1)
MCH: 31.7 pg (ref 26.6–33.0)
MCHC: 34.3 g/dL (ref 31.5–35.7)
MCV: 92 fL (ref 79–97)
MONOS ABS: 0.7 10*3/uL (ref 0.1–0.9)
Monocytes: 10 %
NEUTROS ABS: 5 10*3/uL (ref 1.4–7.0)
Neutrophils: 71 %
PHOSPHORUS: 2.4 mg/dL — AB (ref 2.5–4.5)
Platelets: 175 10*3/uL (ref 150–379)
Potassium: 4 mmol/L (ref 3.5–5.2)
Prostate Specific Ag, Serum: 1.1 ng/mL (ref 0.0–4.0)
RBC: 4.63 x10E6/uL (ref 4.14–5.80)
RDW: 13.8 % (ref 12.3–15.4)
Sodium: 142 mmol/L (ref 134–144)
T3 UPTAKE RATIO: 27 % (ref 24–39)
T4 TOTAL: 6.5 ug/dL (ref 4.5–12.0)
TOTAL PROTEIN: 6.9 g/dL (ref 6.0–8.5)
TSH: 7.2 u[IU]/mL — ABNORMAL HIGH (ref 0.450–4.500)
Triglycerides: 232 mg/dL — ABNORMAL HIGH (ref 0–149)
Uric Acid: 6.1 mg/dL (ref 3.7–8.6)
VLDL Cholesterol Cal: 46 mg/dL — ABNORMAL HIGH (ref 5–40)
WBC: 7 10*3/uL (ref 3.4–10.8)

## 2014-10-23 LAB — HGB A1C W/O EAG: HEMOGLOBIN A1C: 5.8 % — AB (ref 4.8–5.6)

## 2014-11-11 ENCOUNTER — Ambulatory Visit: Payer: Self-pay

## 2014-11-11 DIAGNOSIS — E038 Other specified hypothyroidism: Secondary | ICD-10-CM

## 2014-11-11 DIAGNOSIS — R899 Unspecified abnormal finding in specimens from other organs, systems and tissues: Secondary | ICD-10-CM

## 2014-11-11 NOTE — Progress Notes (Unsigned)
Needed to repeat TSH to recheck recent results.

## 2014-11-30 ENCOUNTER — Other Ambulatory Visit: Payer: Self-pay | Admitting: Family Medicine

## 2014-12-22 ENCOUNTER — Encounter: Payer: Self-pay | Admitting: Obstetrics and Gynecology

## 2014-12-22 ENCOUNTER — Ambulatory Visit (INDEPENDENT_AMBULATORY_CARE_PROVIDER_SITE_OTHER): Payer: Medicare Other | Admitting: Obstetrics and Gynecology

## 2014-12-22 VITALS — BP 119/67 | HR 59 | Temp 97.7°F | Ht 72.0 in | Wt 178.0 lb

## 2014-12-22 DIAGNOSIS — E039 Hypothyroidism, unspecified: Secondary | ICD-10-CM

## 2014-12-22 DIAGNOSIS — Z1159 Encounter for screening for other viral diseases: Secondary | ICD-10-CM

## 2014-12-22 DIAGNOSIS — Z0000 Encounter for general adult medical examination without abnormal findings: Secondary | ICD-10-CM | POA: Diagnosis not present

## 2014-12-22 DIAGNOSIS — E038 Other specified hypothyroidism: Secondary | ICD-10-CM

## 2014-12-22 NOTE — Progress Notes (Signed)
    Subjective: Chief Complaint  Patient presents with  . Annual Exam    HPI: Alan Riley is a 69 y.o. male who presents for preventative visit.  Acute Concerns:  #Blood work obtained at job concerning to patient as his TSH was high and so was his A1c. Has family history of diabetes. Patient denies any symptoms of hypothyroidism including fatigue and cole intolerance.    Diet: Wife cooks most meals. Does have a sweet tooth.   Exercise: Minimal exercise  POA/Living Will: None  ROS reviewed and were negative unless otherwise noted in HPI.  Past Medical, Surgical, Social, and Family History Reviewed & Updated per EMR.  Social:  Social History   Social History  . Marital Status: Married    Spouse Name: N/A  . Number of Children: N/A  . Years of Education: N/A   Occupational History  . Not on file.   Social History Main Topics  . Smoking status: Never Smoker   . Smokeless tobacco: Never Used  . Alcohol Use: Yes     Comment: occassional  . Drug Use: No  . Sexual Activity: Yes    Birth Control/ Protection: None   Other Topics Concern  . Not on file   Social History Narrative    Immunization:  Tdap/TD: Due but declined  Influenza: Up to Date  Pneumococcal: Due  Herpes Zoster: Due but declined  Cancer Screening:  Colonoscopy: Up to date; history of colon cancer   Objective: BP 119/67 mmHg  Pulse 59  Temp(Src) 97.7 F (36.5 C) (Oral)  Ht 6' (1.829 m)  Wt 178 lb (80.74 kg)  BMI 24.14 kg/m2  Physical Exam  Constitutional: He is well-developed, well-nourished, and in no distress.  HENT:  Head: Normocephalic and atraumatic.  Mouth/Throat: Oropharynx is clear and moist.  Eyes: Conjunctivae and EOM are normal. Pupils are equal, round, and reactive to light.  Neck: Normal range of motion. Neck supple. No thyromegaly present.  Cardiovascular: Normal rate, regular rhythm and intact distal pulses.   Pulmonary/Chest: Effort normal and breath sounds normal.    Abdominal: Soft. Bowel sounds are normal. He exhibits no mass. There is no tenderness.  Surgical scars present.   Musculoskeletal: Normal range of motion. He exhibits no edema.  Neurological: He is alert.  Non-focal  Skin: Skin is warm and dry.  Psychiatric: Mood and affect normal.    Assessment/Plan: Please see problem based Assessment and Plan   Orders Placed This Encounter  Procedures  . Hepatitis C antibody screen    Luiz Blare, DO 12/22/2014, 9:00 AM PGY-2, [place]

## 2014-12-22 NOTE — Patient Instructions (Addendum)
Please return in 3 months to have your TSH rechecked. Right now no need for treatment since you are asymptomatic  I am getting some labs on you today and will send you those results.  To help lower your sugars would recommend lifestyle changes such as exercise and watching sugar intake  Health Maintenance, Male A healthy lifestyle and preventative care can promote health and wellness.  Maintain regular health, dental, and eye exams.  Eat a healthy diet. Foods like vegetables, fruits, whole grains, low-fat dairy products, and lean protein foods contain the nutrients you need and are low in calories. Decrease your intake of foods high in solid fats, added sugars, and salt. Get information about a proper diet from your health care provider, if necessary.  Regular physical exercise is one of the most important things you can do for your health. Most adults should get at least 150 minutes of moderate-intensity exercise (any activity that increases your heart rate and causes you to sweat) each week. In addition, most adults need muscle-strengthening exercises on 2 or more days a week.   Maintain a healthy weight. The body mass index (BMI) is a screening tool to identify possible weight problems. It provides an estimate of body fat based on height and weight. Your health care provider can find your BMI and can help you achieve or maintain a healthy weight. For males 20 years and older:  A BMI below 18.5 is considered underweight.  A BMI of 18.5 to 24.9 is normal.  A BMI of 25 to 29.9 is considered overweight.  A BMI of 30 and above is considered obese.  Maintain normal blood lipids and cholesterol by exercising and minimizing your intake of saturated fat. Eat a balanced diet with plenty of fruits and vegetables. Blood tests for lipids and cholesterol should begin at age 50 and be repeated every 5 years. If your lipid or cholesterol levels are high, you are over age 59, or you are at high risk  for heart disease, you may need your cholesterol levels checked more frequently.Ongoing high lipid and cholesterol levels should be treated with medicines if diet and exercise are not working.  If you smoke, find out from your health care provider how to quit. If you do not use tobacco, do not start.  Lung cancer screening is recommended for adults aged 51-80 years who are at high risk for developing lung cancer because of a history of smoking. A yearly low-dose CT scan of the lungs is recommended for people who have at least a 30-pack-year history of smoking and are current smokers or have quit within the past 15 years. A pack year of smoking is smoking an average of 1 pack of cigarettes a day for 1 year (for example, a 30-pack-year history of smoking could mean smoking 1 pack a day for 30 years or 2 packs a day for 15 years). Yearly screening should continue until the smoker has stopped smoking for at least 15 years. Yearly screening should be stopped for people who develop a health problem that would prevent them from having lung cancer treatment.  If you choose to drink alcohol, do not have more than 2 drinks per day. One drink is considered to be 12 oz (360 mL) of beer, 5 oz (150 mL) of wine, or 1.5 oz (45 mL) of liquor.  Avoid the use of street drugs. Do not share needles with anyone. Ask for help if you need support or instructions about stopping the use  of drugs.  High blood pressure causes heart disease and increases the risk of stroke. High blood pressure is more likely to develop in:  People who have blood pressure in the end of the normal range (100-139/85-89 mm Hg).  People who are overweight or obese.  People who are African American.  If you are 62-49 years of age, have your blood pressure checked every 3-5 years. If you are 67 years of age or older, have your blood pressure checked every year. You should have your blood pressure measured twice--once when you are at a hospital or  clinic, and once when you are not at a hospital or clinic. Record the average of the two measurements. To check your blood pressure when you are not at a hospital or clinic, you can use:  An automated blood pressure machine at a pharmacy.  A home blood pressure monitor.  If you are 3-43 years old, ask your health care provider if you should take aspirin to prevent heart disease.  Diabetes screening involves taking a blood sample to check your fasting blood sugar level. This should be done once every 3 years after age 48 if you are at a normal weight and without risk factors for diabetes. Testing should be considered at a younger age or be carried out more frequently if you are overweight and have at least 1 risk factor for diabetes.  Colorectal cancer can be detected and often prevented. Most routine colorectal cancer screening begins at the age of 48 and continues through age 12. However, your health care provider may recommend screening at an earlier age if you have risk factors for colon cancer. On a yearly basis, your health care provider may provide home test kits to check for hidden blood in the stool. A small camera at the end of a tube may be used to directly examine the colon (sigmoidoscopy or colonoscopy) to detect the earliest forms of colorectal cancer. Talk to your health care provider about this at age 53 when routine screening begins. A direct exam of the colon should be repeated every 5-10 years through age 77, unless early forms of precancerous polyps or small growths are found.  People who are at an increased risk for hepatitis B should be screened for this virus. You are considered at high risk for hepatitis B if:  You were born in a country where hepatitis B occurs often. Talk with your health care provider about which countries are considered high risk.  Your parents were born in a high-risk country and you have not received a shot to protect against hepatitis B (hepatitis B  vaccine).  You have HIV or AIDS.  You use needles to inject street drugs.  You live with, or have sex with, someone who has hepatitis B.  You are a man who has sex with other men (MSM).  You get hemodialysis treatment.  You take certain medicines for conditions like cancer, organ transplantation, and autoimmune conditions.  Hepatitis C blood testing is recommended for all people born from [birth year] through 1964 and any individual with known risk factors for hepatitis C.  Healthy men should no longer receive prostate-specific antigen (PSA) blood tests as part of routine cancer screening. Talk to your health care provider about prostate cancer screening.  Testicular cancer screening is not recommended for adolescents or adult males who have no symptoms. Screening includes self-exam, a health care provider exam, and other screening tests. Consult with your health care provider about any symptoms you  have or any concerns you have about testicular cancer.  Practice safe sex. Use condoms and avoid high-risk sexual practices to reduce the spread of sexually transmitted infections (STIs).  You should be screened for STIs, including gonorrhea and chlamydia if:  You are sexually active and are younger than 24 years.  You are older than 24 years, and your health care provider tells you that you are at risk for this type of infection.  Your sexual activity has changed since you were last screened, and you are at an increased risk for chlamydia or gonorrhea. Ask your health care provider if you are at risk.  If you are at risk of being infected with HIV, it is recommended that you take a prescription medicine daily to prevent HIV infection. This is called pre-exposure prophylaxis (PrEP). You are considered at risk if:  You are a man who has sex with other men (MSM).  You are a heterosexual man who is sexually active with multiple partners.  You take drugs by injection.  You are sexually active  with a partner who has HIV.  Talk with your health care provider about whether you are at high risk of being infected with HIV. If you choose to begin PrEP, you should first be tested for HIV. You should then be tested every 3 months for as long as you are taking PrEP.  Use sunscreen. Apply sunscreen liberally and repeatedly throughout the day. You should seek shade when your shadow is shorter than you. Protect yourself by wearing long sleeves, pants, a wide-brimmed hat, and sunglasses year round whenever you are outdoors.  Tell your health care provider of new moles or changes in moles, especially if there is a change in shape or color. Also, tell your health care provider if a mole is larger than the size of a pencil eraser.  A one-time screening for abdominal aortic aneurysm (AAA) and surgical repair of large AAAs by ultrasound is recommended for men aged 60-75 years who are current or former smokers.  Stay current with your vaccines (immunizations).   This information is not intended to replace advice given to you by your health care provider. Make sure you discuss any questions you have with your health care provider.   Document Released: 07/09/2007 Document Revised: 01/31/2014 Document Reviewed: 06/07/2010 Elsevier Interactive Patient Education [employer].

## 2014-12-22 NOTE — Assessment & Plan Note (Signed)
Preventative visit today. Patient doing well. Has had flu vaccine. Declined other vaccines at this time. Obtained HCV screening. Handout given.

## 2014-12-22 NOTE — Assessment & Plan Note (Signed)
TSH on 10/22/14 was 7.2 with normal free T3, T4. Patient is asymptomatic. Repeat labs obtained at work showed decreased TSH 60 6.8 a month later. Believe he has subclinical hypothyroidism. No treatment at this time since he is asymptomatic. Patient to follow-up for recheck in 3 months. Discussed with patient return precautions.

## 2014-12-23 ENCOUNTER — Encounter: Payer: Self-pay | Admitting: Obstetrics and Gynecology

## 2014-12-23 LAB — HEPATITIS C ANTIBODY: HCV AB: NEGATIVE

## 2015-03-24 ENCOUNTER — Other Ambulatory Visit: Payer: Self-pay

## 2015-03-24 ENCOUNTER — Other Ambulatory Visit: Payer: No Typology Code available for payment source

## 2015-03-24 DIAGNOSIS — E038 Other specified hypothyroidism: Secondary | ICD-10-CM

## 2015-03-24 DIAGNOSIS — E039 Hypothyroidism, unspecified: Secondary | ICD-10-CM

## 2015-03-24 LAB — TSH: TSH: 5.69 m[IU]/L — AB (ref 0.40–4.50)

## 2015-03-24 NOTE — Progress Notes (Signed)
tsh done today Javarian Jakubiak 

## 2015-03-26 ENCOUNTER — Encounter: Payer: Self-pay | Admitting: Obstetrics and Gynecology

## 2015-04-17 ENCOUNTER — Encounter: Payer: Self-pay | Admitting: Obstetrics and Gynecology

## 2015-04-17 ENCOUNTER — Ambulatory Visit (INDEPENDENT_AMBULATORY_CARE_PROVIDER_SITE_OTHER): Payer: No Typology Code available for payment source | Admitting: Obstetrics and Gynecology

## 2015-04-17 VITALS — BP 119/65 | HR 63 | Temp 98.1°F | Wt 176.7 lb

## 2015-04-17 DIAGNOSIS — R7303 Prediabetes: Secondary | ICD-10-CM | POA: Insufficient documentation

## 2015-04-17 DIAGNOSIS — E7800 Pure hypercholesterolemia, unspecified: Secondary | ICD-10-CM

## 2015-04-17 DIAGNOSIS — E039 Hypothyroidism, unspecified: Secondary | ICD-10-CM

## 2015-04-17 DIAGNOSIS — Z0000 Encounter for general adult medical examination without abnormal findings: Secondary | ICD-10-CM | POA: Diagnosis not present

## 2015-04-17 DIAGNOSIS — Z85038 Personal history of other malignant neoplasm of large intestine: Secondary | ICD-10-CM

## 2015-04-17 DIAGNOSIS — E038 Other specified hypothyroidism: Secondary | ICD-10-CM | POA: Diagnosis not present

## 2015-04-17 LAB — POCT GLYCOSYLATED HEMOGLOBIN (HGB A1C): HEMOGLOBIN A1C: 5.4

## 2015-04-17 NOTE — Patient Instructions (Addendum)
Will send you a letter about results Follow-up in 75mo for check-up or as needed    Diabetes and Foot Care Diabetes may cause you to have problems because of poor blood supply (circulation) to your feet and legs. This may cause the skin on your feet to become thinner, break easier, and heal more slowly. Your skin may become dry, and the skin may peel and crack. You may also have nerve damage in your legs and feet causing decreased feeling in them. You may not notice minor injuries to your feet that could lead to infections or more serious problems. Taking care of your feet is one of the most important things you can do for yourself.  HOME CARE INSTRUCTIONS  Wear shoes at all times, even in the house. Do not go barefoot. Bare feet are easily injured.  Check your feet daily for blisters, cuts, and redness. If you cannot see the bottom of your feet, use a mirror or ask someone for help.  Wash your feet with warm water (do not use hot water) and mild soap. Then pat your feet and the areas between your toes until they are completely dry. Do not soak your feet as this can dry your skin.  Apply a moisturizing lotion or petroleum jelly (that does not contain alcohol and is unscented) to the skin on your feet and to dry, brittle toenails. Do not apply lotion between your toes.  Trim your toenails straight across. Do not dig under them or around the cuticle. File the edges of your nails with an emery board or nail file.  Do not cut corns or calluses or try to remove them with medicine.  Wear clean socks or stockings every day. Make sure they are not too tight. Do not wear knee-high stockings since they may decrease blood flow to your legs.  Wear shoes that fit properly and have enough cushioning. To break in new shoes, wear them for just a few hours a day. This prevents you from injuring your feet. Always look in your shoes before you put them on to be sure there are no objects inside.  Do not cross  your legs. This may decrease the blood flow to your feet.  If you find a minor scrape, cut, or break in the skin on your feet, keep it and the skin around it clean and dry. These areas may be cleansed with mild soap and water. Do not cleanse the area with peroxide, alcohol, or iodine.  When you remove an adhesive bandage, be sure not to damage the skin around it.  If you have a wound, look at it several times a day to make sure it is healing.  Do not use heating pads or hot water bottles. They may burn your skin. If you have lost feeling in your feet or legs, you may not know it is happening until it is too late.  Make sure your health care provider performs a complete foot exam at least annually or more often if you have foot problems. Report any cuts, sores, or bruises to your health care provider immediately. SEEK MEDICAL CARE IF:   You have an injury that is not healing.  You have cuts or breaks in the skin.  You have an ingrown nail.  You notice redness on your legs or feet.  You feel burning or tingling in your legs or feet.  You have pain or cramps in your legs and feet.  Your legs or feet are  numb.  Your feet always feel cold. SEEK IMMEDIATE MEDICAL CARE IF:   There is increasing redness, swelling, or pain in or around a wound.  There is a red line that goes up your leg.  Pus is coming from a wound.  You develop a fever or as directed by your health care provider.  You notice a bad smell coming from an ulcer or wound.   This information is not intended to replace advice given to you by your health care provider. Make sure you discuss any questions you have with your health care provider.   Document Released: 01/08/2000 Document Revised: 09/12/2012 Document Reviewed: 06/19/2012 Elsevier Interactive Patient Education [employer].

## 2015-04-17 NOTE — Progress Notes (Signed)
     Subjective: Chief Complaint  Patient presents with  . Annual Exam     HPI: Alan Riley is a 70 y.o. presenting to clinic today to discuss the following:  #Wellness visit -patient presents for wellness visit and blood work -states that he wants to make sure everything is okay -only acute concern is about his thyroid since labs were abnormal previously -denies any symptoms from thyroid including weight changes, tremor, fatigue, cold intolerance, etc.  Significant PMH - s/p colon cancer(T3 N2, stage IIIC, status post hemicolectomy on 01/15/2003).  Diet: Wife cooks most meals. Does have a sweet tooth.  Exercise: Minimal exercise  ROS noted in HPI. Also full 12-point review obtained and all negative.  Past Medical, Surgical, Social, and Family History Reviewed & Updated per EMR.   Objective: BP 119/65 mmHg  Pulse 63  Temp(Src) 98.1 F (36.7 C) (Oral)  Wt 176 lb 11.2 oz (80.151 kg) Vitals and nursing notes reviewed  Physical Exam  Constitutional: He is oriented to person, place, and time and well-developed, well-nourished, and in no distress.  HENT:  Head: Normocephalic and atraumatic.  Mouth/Throat: Oropharynx is clear and moist.  Eyes: Conjunctivae and EOM are normal. Pupils are equal, round, and reactive to light.  Neck: Normal range of motion. Neck supple. No thyromegaly present.  Cardiovascular: Normal rate, regular rhythm, normal heart sounds and intact distal pulses.   Pulmonary/Chest: Effort normal and breath sounds normal.  Abdominal: Soft. Bowel sounds are normal. He exhibits no mass. There is no tenderness.  Musculoskeletal: Normal range of motion. He exhibits no edema or tenderness.  Lymphadenopathy:    He has no cervical adenopathy.  Neurological: He is alert and oriented to person, place, and time. He exhibits normal muscle tone. Gait normal.  Non-focal  Skin: Skin is warm and dry.     Diabetic Foot Exam - Simple   Simple Foot Form  Diabetic  Foot exam was performed with the following findings:  Yes 04/17/2015 10:00 AM  Visual Inspection  No deformities, no ulcerations, no other skin breakdown bilaterally:  Yes  Sensation Testing  See comments:  Yes  Pulse Check  Posterior Tibialis and Dorsalis pulse intact bilaterally:  Yes  Comments  Decreased sensation to monofilament testing on bilateral heels. Also calluses appreciated.      Assessment/Plan: Please see problem based Assessment and Plan   Orders Placed This Encounter  Procedures  . Lipid panel  . Basic Metabolic Panel  . POCT glycosylated hemoglobin (Hb A1C)     Luiz Blare, DO 04/17/2015, 9:10 AM PGY-2, [place]

## 2015-04-18 LAB — BASIC METABOLIC PANEL
BUN: 8 mg/dL (ref 7–25)
CALCIUM: 8.9 mg/dL (ref 8.6–10.3)
CO2: 28 mmol/L (ref 20–31)
CREATININE: 0.78 mg/dL (ref 0.70–1.25)
Chloride: 105 mmol/L (ref 98–110)
GLUCOSE: 102 mg/dL — AB (ref 65–99)
Potassium: 4.7 mmol/L (ref 3.5–5.3)
Sodium: 142 mmol/L (ref 135–146)

## 2015-04-18 LAB — LIPID PANEL
Cholesterol: 117 mg/dL — ABNORMAL LOW (ref 125–200)
HDL: 35 mg/dL — AB (ref >=40)
LDL CALC: 62 mg/dL (ref <130)
Total CHOL/HDL Ratio: 3.3 Ratio (ref <=5.0)
Triglycerides: 98 mg/dL (ref <150)
VLDL: 20 mg/dL (ref <30)

## 2015-04-20 ENCOUNTER — Encounter: Payer: Self-pay | Admitting: Obstetrics and Gynecology

## 2015-04-23 ENCOUNTER — Encounter: Payer: Self-pay | Admitting: Obstetrics and Gynecology

## 2015-04-23 NOTE — Assessment & Plan Note (Signed)
H/o impaired glucose. Last A1c was 5.8 last year. Will repeat today. Patient to continue lifestyle modifications.

## 2015-04-23 NOTE — Assessment & Plan Note (Signed)
Patient with elevated levels of TSH and normal T4. He is asymtpomatic. Continue monitoring without treatment. TSH last checked on 03/24/15 - 5.69. Was still meeting criteria for subclinical hypothyroidism. Discussed management of this with patient and wife. Would only treat if he is symptomatic or TSH is above 10.

## 2015-04-23 NOTE — Assessment & Plan Note (Signed)
Wellness visit today. Patient doing well without concerns. Performed diabetic foot exam. Collecting routine labs to monitor disease states. Handout given. Patient can follow-up in 6 months if everything is stable.

## 2015-04-23 NOTE — Assessment & Plan Note (Signed)
Stable and resolved. Last saw oncologist in 2016. Recommended routine screening again from here on out. No special follow-up needed. Last CEA was negative.

## 2015-04-23 NOTE — Assessment & Plan Note (Signed)
Currently on Lipitor due to increased CVD risk. Denies any side effects. Will recollect lipid panel today. May need increase in dose.

## 2015-06-11 ENCOUNTER — Other Ambulatory Visit: Payer: Self-pay | Admitting: Obstetrics and Gynecology

## 2015-10-05 ENCOUNTER — Encounter: Payer: Self-pay | Admitting: Sports Medicine

## 2015-10-05 ENCOUNTER — Ambulatory Visit (INDEPENDENT_AMBULATORY_CARE_PROVIDER_SITE_OTHER): Payer: No Typology Code available for payment source | Admitting: Sports Medicine

## 2015-10-05 VITALS — BP 134/69 | Ht 67.72 in | Wt 172.0 lb

## 2015-10-05 DIAGNOSIS — M25511 Pain in right shoulder: Secondary | ICD-10-CM

## 2015-10-05 MED ORDER — METHYLPREDNISOLONE ACETATE 40 MG/ML IJ SUSP
40.0000 mg | Freq: Once | INTRAMUSCULAR | Status: AC
  Administered 2015-10-05: 40 mg via INTRA_ARTICULAR

## 2015-10-05 NOTE — Progress Notes (Signed)
   Subjective:    Patient ID: Alan Riley, male    DOB: [birth year], 70 y.o.   MRN: [id number]  HPI Alan Riley is a 70 y.o. male that presents to [facility] for right shoulder pain.  He is accompanied by his wife, who provides interpretation for this visit.  He reports that pain has been present for about 2 months.  He points to the anterior-lateral aspect of his right shoulder at the point of maximum pain.  He denies weakness, numbness or previous injury to the shoulder.  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  He has been doing this for several years.  He has tried Voltaren gel and Tylenol with very little improvement in symptoms.     Review of Systems Per HPI    Objective:   Physical Exam Gen: well appearing elderly male, NAD MSK: Prominent ac joint appreciated bilaterally, Full AROM of UE; +pain at about 160 degrees aBduction and flexion.  No TTP to clavicle, +TTP to anterior aspect of right shoulder, 5/5 SITS testing, +empty can, +crossover test Neuro: light touch sensation in tact Skin: no erythema, edema, or eccymosis     Assessment & Plan:   Right shoulder pain Suspect bursitis vs impingement.  Though could have something in the rotator cuff itself but no weakness on today's exam to suggest this.  Patient amenable to shoulder injection w/ steroid today.  He tolerated this well.  See note for procedure details.  Recommend returning in 1 month for reassessment if no improvement in symptoms.  Could consider ultrasound of shoulder at that time to assess rotator cuff integrity.  Shoua Ressler M. Lajuana Ripple, DO PGY-3, [facility]  Patient seen and evaluated with the resident. I agree with the above plan of care. Patient's history and exam are consistent with rotator cuff impingement/tendinopathy. Rotator cuff tear cannot be completely excluded. We will try a subacromial cortisone injection and I'll see the patient in follow-up in 4 weeks for  reevaluation. If symptoms persist, consider imaging in the form of musculoskeletal ultrasound at that time.  Consent obtained and verified. Time-out conducted. Noted no overlying erythema, induration, or other signs of local infection. Skin prepped in a sterile fashion. Topical analgesic spray: Ethyl chloride. Joint: right shoulder (subacromial) Needle: 25g 1.5 inch Completed without difficulty. Meds: 3cc 1% xylocaine, 1cc (40mg ) depomedrol  Advised to call if fevers/chills, erythema, induration, drainage, or persistent bleeding.

## 2015-10-05 NOTE — Assessment & Plan Note (Signed)
Suspect bursitis vs impingement.  Though could have something in the rotator cuff itself but no weakness on today's exam to suggest this.  Patient amenable to shoulder injection w/ steroid today.  He tolerated this well.  See note for procedure details.  Recommend returning in 1 month for reassessment if no improvement in symptoms.  Could consider ultrasound of shoulder at that time to assess rotator cuff integrity.

## 2015-10-12 ENCOUNTER — Ambulatory Visit (INDEPENDENT_AMBULATORY_CARE_PROVIDER_SITE_OTHER): Payer: No Typology Code available for payment source | Admitting: Obstetrics and Gynecology

## 2015-10-12 ENCOUNTER — Encounter: Payer: Self-pay | Admitting: Obstetrics and Gynecology

## 2015-10-12 VITALS — BP 132/66 | HR 65 | Temp 99.0°F | Ht 68.0 in | Wt 181.2 lb

## 2015-10-12 DIAGNOSIS — Z0000 Encounter for general adult medical examination without abnormal findings: Secondary | ICD-10-CM | POA: Diagnosis not present

## 2015-10-12 DIAGNOSIS — M25511 Pain in right shoulder: Secondary | ICD-10-CM

## 2015-10-12 DIAGNOSIS — R7303 Prediabetes: Secondary | ICD-10-CM

## 2015-10-12 NOTE — Progress Notes (Signed)
     Subjective: Chief Complaint  Patient presents with  . Follow-up     HPI: Alan Riley is a 70 y.o. presenting to clinic today For his six-month follow-up. Patient has no acute concerns today. Recently went to sports medicine clinic for right shoulder pain and received an injection. He states that he feels somewhat improved. H/o prediabetes. Patient is working on lifestyle changes. States his diet is mainly fruits and vegetables.    Health Maintenance: Due for flu, Tdap, Zostavax.    ROS noted in HPI.  Past Medical, Surgical, Social, and Family History Reviewed & Updated per EMR. Smoking status - Never Smoker   Objective: BP 132/66   Pulse 65   Temp 99 F (37.2 C) (Oral)   Ht 5\' 8"  (1.727 m)   Wt 181 lb 3.2 oz (82.2 kg)   BMI 27.55 kg/m  Vitals and nursing notes reviewed  Physical Exam  Constitutional: He is well-developed, well-nourished, and in no distress.  Cardiovascular: Normal rate, regular rhythm and normal heart sounds.   Pulmonary/Chest: Effort normal and breath sounds normal.  Musculoskeletal: Normal range of motion. He exhibits no edema or tenderness.  Skin: Skin is warm and dry.  Psychiatric: Mood and affect normal.    Assessment/Plan: Please see problem based Assessment and [place], DO 10/12/2015, 2:19 PM PGY-3, [place]

## 2015-10-12 NOTE — Assessment & Plan Note (Signed)
Last A1c 5.8. Continues to work on lifestyle modifications. Weight increase noticed; discussed weight loss to improve sugars. Will recheck A1c at his next physical exam in 6 months.

## 2015-10-12 NOTE — Assessment & Plan Note (Signed)
Improved since steroid injection about a week ago. Has follow-up appointment in one month. No further treatment needed at this time.

## 2015-10-12 NOTE — Assessment & Plan Note (Signed)
Declined flu shot at this visit states that he will receive through his job. Other health maintenance vaccines also declined including Tdap, Zostavax, and pneumococcal. Will continue to counsel patient and hopefully obtain at annual physical next year.

## 2015-10-12 NOTE — Patient Instructions (Signed)
Follow up in 6 months 

## 2015-10-20 ENCOUNTER — Other Ambulatory Visit: Payer: Self-pay | Admitting: Family Medicine

## 2015-10-21 LAB — CMP12+LP+TP+TSH+6AC+PSA+CBC…
A/G RATIO: 1.9 (ref 1.2–2.2)
ALT: 22 IU/L (ref 0–44)
AST: 19 IU/L (ref 0–40)
Albumin: 4.8 g/dL (ref 3.6–4.8)
Alkaline Phosphatase: 55 IU/L (ref 39–117)
BUN/Creatinine Ratio: 13 (ref 10–24)
BUN: 10 mg/dL (ref 8–27)
Basophils Absolute: 0 10*3/uL (ref 0.0–0.2)
Basos: 0 %
Bilirubin Total: 0.8 mg/dL (ref 0.0–1.2)
CREATININE: 0.76 mg/dL (ref 0.76–1.27)
Calcium: 9.1 mg/dL (ref 8.6–10.2)
Chloride: 101 mmol/L (ref 96–106)
Chol/HDL Ratio: 3.3 ratio units (ref 0.0–5.0)
Cholesterol, Total: 150 mg/dL (ref 100–199)
EOS (ABSOLUTE): 0 10*3/uL (ref 0.0–0.4)
Eos: 0 %
Estimated CHD Risk: <0.5 times avg. (ref 0.0–1.0)
FREE THYROXINE INDEX: 1.9 (ref 1.2–4.9)
GFR calc Af Amer: 108 mL/min/{1.73_m2} (ref >59)
GFR calc non Af Amer: 93 mL/min/{1.73_m2} (ref >59)
GGT: 50 IU/L (ref 0–65)
GLUCOSE: 99 mg/dL (ref 65–99)
Globulin, Total: 2.5 g/dL (ref 1.5–4.5)
HDL: 46 mg/dL (ref >39)
Hematocrit: 45.6 % (ref 37.5–51.0)
Hemoglobin: 15.2 g/dL (ref 12.6–17.7)
IMMATURE GRANS (ABS): 0 10*3/uL (ref 0.0–0.1)
Immature Granulocytes: 0 %
Iron: 128 ug/dL (ref 38–169)
LDH: 200 IU/L (ref 121–224)
LDL Calculated: 77 mg/dL (ref 0–99)
LYMPHS: 16 %
Lymphocytes Absolute: 1.2 10*3/uL (ref 0.7–3.1)
MCH: 31.7 pg (ref 26.6–33.0)
MCHC: 33.3 g/dL (ref 31.5–35.7)
MCV: 95 fL (ref 79–97)
MONOS ABS: 0.6 10*3/uL (ref 0.1–0.9)
Monocytes: 7 %
NEUTROS PCT: 77 %
Neutrophils Absolute: 5.9 10*3/uL (ref 1.4–7.0)
PLATELETS: 190 10*3/uL (ref 150–379)
POTASSIUM: 4.1 mmol/L (ref 3.5–5.2)
Phosphorus: 3.2 mg/dL (ref 2.5–4.5)
Prostate Specific Ag, Serum: 1.3 ng/mL (ref 0.0–4.0)
RBC: 4.8 x10E6/uL (ref 4.14–5.80)
RDW: 13.5 % (ref 12.3–15.4)
Sodium: 143 mmol/L (ref 134–144)
T3 Uptake Ratio: 30 % (ref 24–39)
T4 TOTAL: 6.3 ug/dL (ref 4.5–12.0)
TOTAL PROTEIN: 7.3 g/dL (ref 6.0–8.5)
TSH: 6.61 u[IU]/mL — ABNORMAL HIGH (ref 0.450–4.500)
Triglycerides: 136 mg/dL (ref 0–149)
URIC ACID: 5.7 mg/dL (ref 3.7–8.6)
VLDL Cholesterol Cal: 27 mg/dL (ref 5–40)
WBC: 7.7 10*3/uL (ref 3.4–10.8)

## 2015-10-21 LAB — HGB A1C W/O EAG: Hgb A1c MFr Bld: 5.7 % — ABNORMAL HIGH (ref 4.8–5.6)

## 2015-11-06 ENCOUNTER — Encounter: Payer: Self-pay | Admitting: Obstetrics and Gynecology

## 2015-11-06 ENCOUNTER — Ambulatory Visit (INDEPENDENT_AMBULATORY_CARE_PROVIDER_SITE_OTHER): Payer: No Typology Code available for payment source | Admitting: Obstetrics and Gynecology

## 2015-11-06 VITALS — BP 141/78 | HR 72 | Temp 98.5°F | Wt 184.2 lb

## 2015-11-06 DIAGNOSIS — E039 Hypothyroidism, unspecified: Secondary | ICD-10-CM

## 2015-11-06 DIAGNOSIS — R7303 Prediabetes: Secondary | ICD-10-CM

## 2015-11-06 DIAGNOSIS — Z23 Encounter for immunization: Secondary | ICD-10-CM | POA: Diagnosis not present

## 2015-11-06 DIAGNOSIS — E038 Other specified hypothyroidism: Secondary | ICD-10-CM

## 2015-11-06 LAB — GLUCOSE, CAPILLARY: GLUCOSE-CAPILLARY: 126 mg/dL — AB (ref 65–99)

## 2015-11-06 NOTE — Assessment & Plan Note (Signed)
POC glucose 126. Non-fasting. Continue to monitor.

## 2015-11-06 NOTE — Progress Notes (Signed)
     Subjective: Chief Complaint  Patient presents with  . Thyroid Problem    lab results show too high      HPI: Alan Riley is a 70 y.o. presenting to clinic today to discuss the following:  #Hyperglycemia Last A1c 2 weeks ago 5.7 - prediabetes POC glucose today 126  #Thyroid Concern that thyroid too high Received blood work from his job Lab work same as blood work from 2 weeks ago [place] h/o subclinical hypothyroidism Wanted to come in today to be checked out Denies symptoms of constipation, tremor, hair loss, weight changes  Health Maintenance: flu vaccine due    ROS noted in HPI.  Past Medical, Surgical, Social, and Family History Reviewed & Updated per EMR. Smoking status - Never smoker   Objective: BP (!) 141/78   Pulse 72   Temp 98.5 F (36.9 C) (Oral)   Wt 184 lb 3.2 oz (83.6 kg)   BMI 28.01 kg/m  Vitals and nursing notes reviewed  Physical Exam  Constitutional: He is well-developed, well-nourished, and in no distress.  Neck: Normal range of motion. Neck supple. No thyromegaly present.  Cardiovascular: Normal rate, regular rhythm and normal heart sounds.   Pulmonary/Chest: Effort normal and breath sounds normal.     Results for orders placed or performed in visit on 11/06/15 (from the past 72 hour(s))  Glucose, capillary     Status: Abnormal   Collection Time: 11/06/15 10:36 AM  Result Value Ref Range   Glucose-Capillary 126 (H) 65 - 99 mg/dL    Assessment/Plan: Please see problem based Assessment and Plan  Health Maintainance: flu vaccine given    Orders Placed This Encounter  Procedures  . Flu Vaccine QUAD 36+ mos IM  . Glucose, capillary     Luiz Blare, DO 11/06/2015, 9:50 AM PGY-3, [place]

## 2015-11-06 NOTE — Patient Instructions (Signed)
Labs look fine I will continue to monitor yearly Flu vaccine given today

## 2015-11-06 NOTE — Assessment & Plan Note (Signed)
Reassured and educated patient again on subclinical hypothyroidism. TSH 6.6 with normal T3, T4. Asymptomatic. Will continue to observe on yearly bases.

## 2015-12-09 ENCOUNTER — Other Ambulatory Visit: Payer: Self-pay | Admitting: Obstetrics and Gynecology

## 2016-01-06 ENCOUNTER — Telehealth: Payer: Self-pay | Admitting: Obstetrics and Gynecology

## 2016-01-06 NOTE — Telephone Encounter (Signed)
Will forward to MD to make aware. Naksh Radi,CMA  

## 2016-01-06 NOTE — Telephone Encounter (Signed)
Pt's wife needs dates changed on FMLA paperwork from 01-18-16 thru 01-31-16 to 01-18-16 thru 02-02-16. Any questions call Sharyn Lull [phone] or [phone]. Please advise. Thanks! ep

## 2016-01-07 NOTE — Telephone Encounter (Signed)
Dates noted. She needs to contact her employer for date changes I can not change dates on form. Form completed and will be faxed tomorrow.

## 2016-01-12 ENCOUNTER — Ambulatory Visit (INDEPENDENT_AMBULATORY_CARE_PROVIDER_SITE_OTHER): Payer: No Typology Code available for payment source | Admitting: Family Medicine

## 2016-01-12 ENCOUNTER — Encounter: Payer: Self-pay | Admitting: Family Medicine

## 2016-01-12 VITALS — BP 116/78 | HR 78 | Temp 98.2°F | Ht 68.0 in | Wt 184.0 lb

## 2016-01-12 DIAGNOSIS — R413 Other amnesia: Secondary | ICD-10-CM | POA: Diagnosis not present

## 2016-01-12 NOTE — Patient Instructions (Signed)
It was great seeing you today! We have addressed the following issues today  1. Today we did a Mini Cog to test your memory, and you passed the test. I would like you to make a follow up with Dr.Phelps so you can follow up on the test results 2. Please Keep taking your fish oil, exercise and do crossword and word finding puzzle.   If we did any lab work today, and the results require attention, either me or my nurse will get in touch with you. If everything is normal, you will get a letter in mail. If you don't hear from [place] in two weeks, please give [place] a call. Otherwise, we look forward to seeing you again at your next visit. If you have any questions or concerns before then, please call the clinic at [phone].   Please bring all your medications to every doctors visit   Sign up for My Chart to have easy access to your labs results, and communication with your Primary care physician.     Please check-out at the front desk before leaving the clinic.    Take Care,  Management of Memory Problems  There are some general things you can do to help manage your memory problems.  Your memory may not in fact recover, but by using techniques and strategies you will be able to manage your memory difficulties better.  1)  Establish a routine.  Try to establish and then stick to a regular routine.  By doing this, you will get used to what to expect and you will reduce the need to rely on your memory.  Also, try to do things at the same time of day, such as taking your medication or checking your calendar first thing in the morning.  Think about think that you can do as a part of a regular routine and make a list.  Then enter them into a daily planner to remind you.  This will help you establish a routine.  2)  Organize your environment.  Organize your environment so that it is uncluttered.  Decrease visual stimulation.  Place everyday items such as keys or cell phone in the same place every  day (ie.  Basket next to front door)  Use post it notes with a brief message to yourself (ie. Turn off light, lock the door)  Use labels to indicate where things go (ie. Which cupboards are for food, dishes, etc.)  Keep a notepad and pen by the telephone to take messages  3)  Memory Aids  A diary or journal/notebook/daily planner  Making a list (shopping list, chore list, to do list that needs to be done)  Using an alarm as a reminder (kitchen timer or cell phone alarm)  Using cell phone to store information (Notes, Calendar, Reminders)  Calendar/White board placed in a prominent position  Post-it notes  In order for memory aids to be useful, you need to have good habits.  It's no good remembering to make a note in your journal if you don't remember to look in it.  Try setting aside a certain time of day to look in journal.  4)  Improving mood and managing fatigue.  There may be other factors that contribute to memory difficulties.  Factors, such as anxiety, depression and tiredness can affect memory.  Regular gentle exercise can help improve your mood and give you more energy.  Simple relaxation techniques may help relieve symptoms of anxiety  Try to get back  to completing activities or hobbies you enjoyed doing in the past.  Learn to pace yourself through activities to decrease fatigue.  Find out about some local support groups where you can share experiences with others.  Try and achieve 7-8 hours of sleep at night.

## 2016-01-12 NOTE — Progress Notes (Signed)
   Subjective:    Patient ID: Alan Riley, male    DOB: 1945/06/21, 70 y.o.   MRN: [id number]   CC: Memory problems   HPI: Patient is 70 yo with a past medical history significant for HLD, colon cancer and hypothyroidism who presents here with memory troubles.  Memory difficulties: History is obtained through wife since patient has limited [place]. Wife reports that patient got lost twice in the past month and have looked confused at times and have been trouble recognizing familiar faces.  Smoking status reviewed   ROS: all other systems were reviewed and are negative other than in the HPI   Past medical history, surgical, family, and social history reviewed and updated in the EMR as appropriate.  Objective:  BP 116/78   Pulse 78   Temp 98.2 F (36.8 C) (Oral)   Ht 5\' 8"  (1.727 m)   Wt 184 lb (83.5 kg)   SpO2 96%   BMI 27.98 kg/m   Vitals and nursing note reviewed  General: NAD, pleasant, able to participate in exam Cardiac: RRR, normal heart sounds, no murmurs. 2+ radial and PT pulses bilaterally Respiratory: CTAB, normal effort, No wheezes, rales or rhonchi Abdomen: soft, nontender, nondistended, no hepatic or splenomegaly, +BS Extremities: no edema or cyanosis. WWP. Skin: warm and dry, no rashes noted Neuro: alert and oriented x4, no focal deficits Psych: Normal affect and mood   Assessment & Plan:   #Memory troubles, subacute: Patient appears well, in good spirit and though difficult to communicate with him directly, was appropriate during consultation. Patient seem to be understanding direction and was able to answer questions appropriately. Patient performed well on the mini cog exam with a score of 5 out of 5. Patient had good word recall and did well on the clock drawing.  At this point, patient could be exhibiting early sign of dementia vs alzheimer. Given patient family history of Alzheimer/Dementia (unclear) will continue to closely monitor with  PCP.  --Would consider Mini mental Status for further screening --Consider HIV, RPR, TSH to rule out possible infectious and metabolic etiology --Please follow up with PCP for further evaluation     Marjie Skiff, MD [facility] Medicine Resident PGY-1

## 2016-02-19 ENCOUNTER — Encounter: Payer: Self-pay | Admitting: Obstetrics and Gynecology

## 2016-02-19 ENCOUNTER — Ambulatory Visit (INDEPENDENT_AMBULATORY_CARE_PROVIDER_SITE_OTHER): Payer: No Typology Code available for payment source | Admitting: Obstetrics and Gynecology

## 2016-02-19 DIAGNOSIS — R413 Other amnesia: Secondary | ICD-10-CM | POA: Diagnosis not present

## 2016-02-19 NOTE — Progress Notes (Signed)
     Subjective: Chief Complaint  Patient presents with  . Memory Loss     HPI: Alan Riley is a 72 y.o. presenting to clinic today to discuss the following:  #Memory Concerns Wife brings patient in for further evaluation of memory deficits States patient has FH of Alzheimer's dementia Wife states patient has been confused on several occasions. Even had incidents in the last year where he got lost and someone had to call her to come pick him up. Some issues recognizing familiar faces Was evaluated in clinic about 2 months ago  With mini cog which was normal Patient still working without difficulty Wife wants further evalaution  Past Medical History:  Diagnosis Date  . Hx of adenomatous colonic polyps   . Iron deficiency anemia secondary to blood loss (chronic)   . Malignant neoplasm of ascending colon ([place])   . Partial small bowel obstruction 12/29/2013    ROS noted in HPI.  Past Medical, Surgical, Social, and Family History Reviewed & Updated per EMR. History  Smoking Status  . Never Smoker  Smokeless Tobacco  . Never Used    Objective: BP (!) 124/58   Pulse 65   Temp 97.5 F (36.4 C) (Oral)   Ht 5\' 8"  (1.727 m)   Wt 186 lb (84.4 kg)   BMI 28.28 kg/m  Vitals and nursing notes reviewed  Physical Exam General: NAD, pleasant, able to participate in exam, well-appearing Cardiac: regular rate Respiratory: normal effort Neuro: alert and oriented x4, no focal deficits Psych: Normal affect and mood  No results found for this or any previous visit (from the past 72 hour(s)).  Assessment/Plan: Please see problem based Assessment and Plan PATIENT EDUCATION PROVIDED: See AVS    Luiz Blare, DO 02/19/2016, 4:44 PM PGY-3, [place]

## 2016-02-19 NOTE — Patient Instructions (Signed)

## 2016-02-23 DIAGNOSIS — R413 Other amnesia: Secondary | ICD-10-CM | POA: Insufficient documentation

## 2016-02-23 NOTE — Assessment & Plan Note (Signed)
Prior mini-cog negative. Attempted MOCA eval today but limited due to language barrier and wife not wanting to use interpretor. Patient may have memory difficulties but overall was able to comprehend and pass the [place]. Would recommend next evaluation be done with interpretor and without wife assistance for best results. Further work-up if continues to include CT head imaging, HIV, RPR. Counseled on exercises to help with memory. Handout given. Wife to keep record of episodes of memory difficulties. Would also recommend following up in geriatric clinic. Reassess in 6 months.

## 2016-06-07 ENCOUNTER — Other Ambulatory Visit: Payer: Self-pay | Admitting: Obstetrics and Gynecology

## 2016-10-05 ENCOUNTER — Ambulatory Visit: Payer: Self-pay | Admitting: Family Medicine

## 2016-10-17 ENCOUNTER — Ambulatory Visit: Payer: Self-pay | Admitting: *Deleted

## 2016-10-17 VITALS — BP 140/86 | Ht 68.0 in | Wt 175.0 lb

## 2016-10-17 DIAGNOSIS — Z0000 Encounter for general adult medical examination without abnormal findings: Secondary | ICD-10-CM

## 2016-10-17 NOTE — Progress Notes (Signed)
Be Well insurance premium discount evaluation: Labs Drawn. [employer]pl[employer] ROI form signed. Tobacco Free Attestation form signed.  Forms placed in paper chart.  Okay to route results to pcp. Sees [facility]. Typically sees [place], DO.  For result review, pt requests RN to review labs with pt and coworker Jovanka to help translate tomorrow.

## 2016-10-18 LAB — CMP12+LP+TP+TSH+6AC+PSA+CBC…
ALBUMIN: 4.5 g/dL (ref 3.5–4.8)
ALT: 18 IU/L (ref 0–44)
AST: 19 IU/L (ref 0–40)
Albumin/Globulin Ratio: 1.8 (ref 1.2–2.2)
Alkaline Phosphatase: 58 IU/L (ref 39–117)
BASOS ABS: 0 10*3/uL (ref 0.0–0.2)
BUN / CREAT RATIO: 11 (ref 10–24)
BUN: 8 mg/dL (ref 8–27)
Basos: 0 %
Bilirubin Total: 0.6 mg/dL (ref 0.0–1.2)
CALCIUM: 9.1 mg/dL (ref 8.6–10.2)
CHLORIDE: 100 mmol/L (ref 96–106)
Chol/HDL Ratio: 3.5 ratio (ref 0.0–5.0)
Cholesterol, Total: 130 mg/dL (ref 100–199)
Creatinine, Ser: 0.73 mg/dL — ABNORMAL LOW (ref 0.76–1.27)
EOS (ABSOLUTE): 0 10*3/uL (ref 0.0–0.4)
ESTIMATED CHD RISK: 0.6 times avg. (ref 0.0–1.0)
Eos: 0 %
FREE THYROXINE INDEX: 1.7 (ref 1.2–4.9)
GFR calc Af Amer: 109 mL/min/{1.73_m2} (ref >59)
GFR calc non Af Amer: 94 mL/min/{1.73_m2} (ref >59)
GGT: 46 IU/L (ref 0–65)
GLOBULIN, TOTAL: 2.5 g/dL (ref 1.5–4.5)
Glucose: 107 mg/dL — ABNORMAL HIGH (ref 65–99)
HDL: 37 mg/dL — ABNORMAL LOW (ref >39)
Hematocrit: 42.5 % (ref 37.5–51.0)
Hemoglobin: 14.4 g/dL (ref 13.0–17.7)
IMMATURE GRANULOCYTES: 0 %
IRON: 100 ug/dL (ref 38–169)
Immature Grans (Abs): 0 10*3/uL (ref 0.0–0.1)
LDH: 172 IU/L (ref 121–224)
LDL Calculated: 69 mg/dL (ref 0–99)
LYMPHS ABS: 1.1 10*3/uL (ref 0.7–3.1)
Lymphs: 16 %
MCH: 31.4 pg (ref 26.6–33.0)
MCHC: 33.9 g/dL (ref 31.5–35.7)
MCV: 93 fL (ref 79–97)
MONOS ABS: 0.6 10*3/uL (ref 0.1–0.9)
Monocytes: 8 %
NEUTROS PCT: 76 %
Neutrophils Absolute: 5.3 10*3/uL (ref 1.4–7.0)
PLATELETS: 200 10*3/uL (ref 150–379)
PROSTATE SPECIFIC AG, SERUM: 1.2 ng/mL (ref 0.0–4.0)
Phosphorus: 3.2 mg/dL (ref 2.5–4.5)
Potassium: 4.4 mmol/L (ref 3.5–5.2)
RBC: 4.59 x10E6/uL (ref 4.14–5.80)
RDW: 13.4 % (ref 12.3–15.4)
Sodium: 141 mmol/L (ref 134–144)
T3 UPTAKE RATIO: 28 % (ref 24–39)
T4 TOTAL: 6.1 ug/dL (ref 4.5–12.0)
TSH: 9.74 u[IU]/mL — AB (ref 0.450–4.500)
Total Protein: 7 g/dL (ref 6.0–8.5)
Triglycerides: 122 mg/dL (ref 0–149)
Uric Acid: 5.1 mg/dL (ref 3.7–8.6)
VLDL Cholesterol Cal: 24 mg/dL (ref 5–40)
WBC: 7 10*3/uL (ref 3.4–10.8)

## 2016-10-18 LAB — HGB A1C W/O EAG: Hgb A1c MFr Bld: 5.7 % — ABNORMAL HIGH (ref 4.8–5.6)

## 2016-10-21 NOTE — Progress Notes (Signed)
Results reviewed with pt with assistance of coworker translating at pt's request. A1c and glucose slightly elevated. Creatinine slightly decreased. Avoid OTCs and dehydration. HDL decreased. TSH worse than previous. Pt reports seeing doctor last year and doctor was ok with previous tsh level. Informed pt that MD may want to investigate more since it has worsened. Pt denies hyper/hypo-thyroid sx. All other labs WNL. BP elevated. Diet and exercise recommendations for HTN and wt management discussed. Routine f/u with pcp. Copy of labs provided to pt. Results routed to pcp per pt request. No further questions concerns.

## 2016-11-18 ENCOUNTER — Ambulatory Visit (INDEPENDENT_AMBULATORY_CARE_PROVIDER_SITE_OTHER): Payer: No Typology Code available for payment source | Admitting: Family Medicine

## 2016-11-18 ENCOUNTER — Encounter: Payer: Self-pay | Admitting: Family Medicine

## 2016-11-18 VITALS — BP 130/62 | HR 72 | Temp 98.4°F | Ht 68.0 in | Wt 179.0 lb

## 2016-11-18 DIAGNOSIS — E038 Other specified hypothyroidism: Secondary | ICD-10-CM

## 2016-11-18 DIAGNOSIS — E039 Hypothyroidism, unspecified: Secondary | ICD-10-CM | POA: Diagnosis not present

## 2016-11-18 DIAGNOSIS — Z0000 Encounter for general adult medical examination without abnormal findings: Secondary | ICD-10-CM | POA: Diagnosis not present

## 2016-11-18 DIAGNOSIS — Z23 Encounter for immunization: Secondary | ICD-10-CM

## 2016-11-18 NOTE — Progress Notes (Signed)
    Subjective:    Patient ID: Alan Riley, male    DOB: 09/26/1945, 71 y.o.   MRN: [id number]   CC: here for physical exam  Doing well, has a part time job doing Nurse, adult work. He is physically active at his job and around the house without difficulty. He gets labs through his job and has persistently elevated TSH on labs with normal T3, T4.  He feels well overall.  He denies alcohol use, tobacco use, illicit drug use He has a PMH of colon cancer that is in remission s/p hemicolectomy No allergies Takes lipitor, fish oil, OTC advil/tylenol as needed  Elevated TSH- persistently elevated on labs he gets for work. He is asymptomatic- denies constipation, fatigue, cold intolerance, dry skin, depressed mood.   Review of Systems- see HPI, additionally, no CP, SOB, DOE, unintentional weight loss, changes in bowel habits, blood in stool   Objective:  BP 130/62   Pulse 72   Temp 98.4 F (36.9 C) (Oral)   Ht 5\' 8"  (1.727 m)   Wt 179 lb (81.2 kg)   SpO2 98%   BMI 27.22 kg/m  Vitals and nursing note reviewed  General: well nourished, in no acute distress HEENT: normocephalic, PERRLA, EOMI. No scleral icterus or conjunctival pallor, TM's visualized bilaterally with normal cone of light, no nasal discharge, moist mucous membranes, good dentition without erythema or discharge noted in posterior oropharynx Neck: supple, non-tender, without lymphadenopathy Cardiac: RRR, clear S1 and S2, no murmurs, rubs, or gallops Respiratory: clear to auscultation bilaterally, no increased work of breathing Abdomen: soft, nontender, nondistended, no masses or organomegaly. Bowel sounds present Extremities: no edema or cyanosis. Warm, well perfused. 2+ radial and PT pulses bilaterally Skin: warm and dry, no rashes noted Neuro: alert and oriented, no focal deficits, strength 5/5 in upper and lower extremities bilaterally    Assessment & Plan:    Subclinical hypothyroidism  TSH elevated on recent  labs, T3/T4 normal, patient asymptomatic. Spoke with daughter on phone to explain labs to her, she voiced understanding of rationale to not treat  -continue to monitor TSH yearly -patient to follow up sooner if he has any symptoms of hypothyroidism -Patient verbalized understanding and agreement with plan.   Healthcare maintenance  Patient received flu vaccine today Declined pneumonia vaccine Labs drawn annually through his job    Return in about 1 year (around 11/18/2017).   Lucila Maine, DO Family Medicine Resident PGY-2

## 2016-11-18 NOTE — Assessment & Plan Note (Signed)
  Patient received flu vaccine today Declined pneumonia vaccine Labs drawn annually through his job

## 2016-11-18 NOTE — Assessment & Plan Note (Signed)
  TSH elevated on recent labs, T3/T4 normal, patient asymptomatic. Spoke with daughter on phone to explain labs to her, she voiced understanding of rationale to not treat  -continue to monitor TSH yearly -patient to follow up sooner if he has any symptoms of hypothyroidism -Patient verbalized understanding and agreement with plan.

## 2016-11-18 NOTE — Patient Instructions (Signed)
   It was very nice to meet you today.  We will keep an eye on your thyroid level every year. If you develop symptoms of feeling very cold, tired without good reason, constipation, dry skin, depression, please let me know sooner and we will look at the thyroid levels again.  Please return to be seen in 1 year for your next exam or sooner if you need medical care.   If you have questions or concerns please do not hesitate to call at [phone].  Lucila Maine, DO PGY-2, [facility] 11/18/2016 9:41 AM

## 2016-12-13 ENCOUNTER — Other Ambulatory Visit: Payer: Self-pay | Admitting: Obstetrics and Gynecology

## 2017-01-24 DIAGNOSIS — C4491 Basal cell carcinoma of skin, unspecified: Secondary | ICD-10-CM

## 2017-01-24 HISTORY — DX: Basal cell carcinoma of skin, unspecified: C44.91

## 2017-06-08 ENCOUNTER — Other Ambulatory Visit: Payer: Self-pay | Admitting: Family Medicine

## 2017-12-05 ENCOUNTER — Other Ambulatory Visit: Payer: Self-pay | Admitting: Family Medicine

## 2018-01-12 ENCOUNTER — Other Ambulatory Visit: Payer: Self-pay

## 2018-01-12 ENCOUNTER — Ambulatory Visit (INDEPENDENT_AMBULATORY_CARE_PROVIDER_SITE_OTHER): Payer: No Typology Code available for payment source | Admitting: Family Medicine

## 2018-01-12 ENCOUNTER — Encounter: Payer: Self-pay | Admitting: Family Medicine

## 2018-01-12 VITALS — BP 114/62 | HR 60 | Temp 98.7°F | Ht 68.0 in | Wt 175.2 lb

## 2018-01-12 DIAGNOSIS — R7303 Prediabetes: Secondary | ICD-10-CM

## 2018-01-12 DIAGNOSIS — E038 Other specified hypothyroidism: Secondary | ICD-10-CM

## 2018-01-12 DIAGNOSIS — Z23 Encounter for immunization: Secondary | ICD-10-CM | POA: Diagnosis not present

## 2018-01-12 DIAGNOSIS — Z0000 Encounter for general adult medical examination without abnormal findings: Secondary | ICD-10-CM

## 2018-01-12 DIAGNOSIS — E039 Hypothyroidism, unspecified: Secondary | ICD-10-CM

## 2018-01-12 DIAGNOSIS — E7800 Pure hypercholesterolemia, unspecified: Secondary | ICD-10-CM

## 2018-01-12 NOTE — Assessment & Plan Note (Signed)
  Check TSH today, pt asymptomatic

## 2018-01-12 NOTE — Assessment & Plan Note (Signed)
  Check BMP today, patient is fasting

## 2018-01-12 NOTE — Progress Notes (Signed)
    Subjective:    Patient ID: Alan Riley, male    DOB: 1945/01/31, 72 y.o.   MRN: [id number]   CC: physical exam   HPI: doing well overall. He had a basal cell carcinoma removed from his scalp last month and is scheduled to follow up with dermatology in February. No other health updates or changes in the past year. Reports walking daily for exercise with wife. No alcohol use, tobacco, drug use. He is taking lipitor and fish oil, vitamin D supplementations. Takes ibuprofen or tylenol as needed for aches/pains. He is due for flu shot today.   Smoking status reviewed- non-smoker  Review of Systems- see HPI, additionally, no recent illnesses, unintentional weight loss, chest pain, SOB, DOE. No constipation, depression, dry skin, hair loss   Objective:  BP 114/62   Pulse 60   Temp 98.7 F (37.1 C) (Oral)   Ht 5\' 8"  (1.727 m)   Wt 175 lb 4 oz (79.5 kg)   SpO2 96%   BMI 26.65 kg/m  Vitals and nursing note reviewed  General: well nourished, in no acute distress HEENT: normocephalic, TM's visualized bilaterally, no scleral icterus or conjunctival pallor, no nasal discharge, moist mucous membranes, good dentition without erythema or discharge noted in posterior oropharynx Neck: supple, non-tender, without lymphadenopathy Cardiac: RRR, clear S1 and S2, no murmurs, rubs, or gallops Respiratory: clear to auscultation bilaterally, no increased work of breathing Abdomen: soft, nontender, nondistended, no masses or organomegaly. Bowel sounds present Extremities: no edema or cyanosis Skin: warm and dry, no rashes noted Neuro: alert and oriented, no focal deficits   Assessment & Plan:    Pure hypercholesterolemia  Check lipid panel, CMP today as pt is on statin therapy  Prediabetes  Check BMP today, patient is fasting  Subclinical hypothyroidism  Check TSH today, pt asymptomatic  Flu shot given today  Return in about 1 year (around 01/13/2019), or as needed.   Lucila Maine, DO Family Medicine Resident PGY-3

## 2018-01-12 NOTE — Assessment & Plan Note (Signed)
  Check lipid panel, CMP today as pt is on statin therapy

## 2018-01-12 NOTE — Patient Instructions (Signed)
  Nice to see you today! I will send you a letter with your lab results. Keep up the walking for exercise.  If you have questions or concerns please do not hesitate to call at [phone].  Lucila Maine, DO PGY-3, [place] 01/12/2018 9:13 AM

## 2018-01-13 LAB — LIPID PANEL
Chol/HDL Ratio: 3.6 ratio (ref 0.0–5.0)
Cholesterol, Total: 119 mg/dL (ref 100–199)
HDL: 33 mg/dL — ABNORMAL LOW (ref >39)
LDL Calculated: 57 mg/dL (ref 0–99)
TRIGLYCERIDES: 145 mg/dL (ref 0–149)
VLDL CHOLESTEROL CAL: 29 mg/dL (ref 5–40)

## 2018-01-13 LAB — CMP14+EGFR
ALT: 18 IU/L (ref 0–44)
AST: 22 IU/L (ref 0–40)
Albumin/Globulin Ratio: 2.1 (ref 1.2–2.2)
Albumin: 4.4 g/dL (ref 3.5–4.8)
Alkaline Phosphatase: 59 IU/L (ref 39–117)
BUN/Creatinine Ratio: 9 — ABNORMAL LOW (ref 10–24)
BUN: 6 mg/dL — AB (ref 8–27)
Bilirubin Total: 0.7 mg/dL (ref 0.0–1.2)
CO2: 24 mmol/L (ref 20–29)
Calcium: 9.2 mg/dL (ref 8.6–10.2)
Chloride: 104 mmol/L (ref 96–106)
Creatinine, Ser: 0.68 mg/dL — ABNORMAL LOW (ref 0.76–1.27)
GFR calc Af Amer: 111 mL/min/{1.73_m2} (ref >59)
GFR, EST NON AFRICAN AMERICAN: 96 mL/min/{1.73_m2} (ref >59)
GLOBULIN, TOTAL: 2.1 g/dL (ref 1.5–4.5)
Glucose: 100 mg/dL — ABNORMAL HIGH (ref 65–99)
Potassium: 4.3 mmol/L (ref 3.5–5.2)
SODIUM: 142 mmol/L (ref 134–144)
Total Protein: 6.5 g/dL (ref 6.0–8.5)

## 2018-01-13 LAB — CBC
HEMOGLOBIN: 14.3 g/dL (ref 13.0–17.7)
Hematocrit: 41.6 % (ref 37.5–51.0)
MCH: 32.4 pg (ref 26.6–33.0)
MCHC: 34.4 g/dL (ref 31.5–35.7)
MCV: 94 fL (ref 79–97)
Platelets: 181 10*3/uL (ref 150–450)
RBC: 4.41 x10E6/uL (ref 4.14–5.80)
RDW: 12.4 % (ref 12.3–15.4)
WBC: 7 10*3/uL (ref 3.4–10.8)

## 2018-01-13 LAB — TSH: TSH: 7.95 u[IU]/mL — ABNORMAL HIGH (ref 0.450–4.500)

## 2018-01-22 ENCOUNTER — Encounter: Payer: Self-pay | Admitting: Family Medicine

## 2018-01-22 NOTE — Progress Notes (Signed)
  Letter mailed to patient with normal lab results  Alan Maine, DO PGY-3, [facility] 01/22/2018 9:30 AM

## 2018-04-19 NOTE — Progress Notes (Signed)
Seen by PCP 12.20.2020. Labs drawn then. TSH still elevated but improved. Subclinical, asymptomatic. No treatment indicated or initiated. No A1c drawn. Fasting glucose WNL. Values used for 2020 Be Well eval.

## 2018-04-20 NOTE — Progress Notes (Signed)
noted 

## 2018-05-31 ENCOUNTER — Other Ambulatory Visit: Payer: Self-pay | Admitting: Family Medicine

## 2018-09-06 ENCOUNTER — Ambulatory Visit: Payer: Self-pay | Admitting: Emergency Medicine

## 2018-09-20 DIAGNOSIS — L821 Other seborrheic keratosis: Secondary | ICD-10-CM | POA: Diagnosis not present

## 2018-09-20 DIAGNOSIS — D1801 Hemangioma of skin and subcutaneous tissue: Secondary | ICD-10-CM | POA: Diagnosis not present

## 2018-09-20 DIAGNOSIS — Z85828 Personal history of other malignant neoplasm of skin: Secondary | ICD-10-CM | POA: Diagnosis not present

## 2018-09-20 DIAGNOSIS — D225 Melanocytic nevi of trunk: Secondary | ICD-10-CM | POA: Diagnosis not present

## 2018-09-20 DIAGNOSIS — B353 Tinea pedis: Secondary | ICD-10-CM | POA: Diagnosis not present

## 2018-10-24 ENCOUNTER — Other Ambulatory Visit: Payer: Self-pay

## 2018-10-24 ENCOUNTER — Ambulatory Visit (INDEPENDENT_AMBULATORY_CARE_PROVIDER_SITE_OTHER): Payer: Medicare HMO | Admitting: Family Medicine

## 2018-10-24 ENCOUNTER — Encounter: Payer: Self-pay | Admitting: Family Medicine

## 2018-10-24 VITALS — BP 122/60 | HR 62 | Temp 98.5°F | Ht 68.0 in | Wt 173.8 lb

## 2018-10-24 DIAGNOSIS — Z23 Encounter for immunization: Secondary | ICD-10-CM

## 2018-10-24 DIAGNOSIS — R7303 Prediabetes: Secondary | ICD-10-CM | POA: Diagnosis not present

## 2018-10-24 DIAGNOSIS — R7989 Other specified abnormal findings of blood chemistry: Secondary | ICD-10-CM | POA: Diagnosis not present

## 2018-10-24 DIAGNOSIS — E038 Other specified hypothyroidism: Secondary | ICD-10-CM

## 2018-10-24 DIAGNOSIS — E7800 Pure hypercholesterolemia, unspecified: Secondary | ICD-10-CM | POA: Diagnosis not present

## 2018-10-24 DIAGNOSIS — E039 Hypothyroidism, unspecified: Secondary | ICD-10-CM

## 2018-10-24 NOTE — Progress Notes (Signed)
New Patient Office Visit  Subjective:  Patient ID: Alan Riley, male    DOB: [birth year]  Age: 73 y.o. MRN: [id number]  CC:  Chief Complaint  Patient presents with  . Establish Care    blood work tomorrow for they are not fasting today     HPI Alan Riley presents for   Elevated Thyroid Patient's wife translates He has a history of elevated tsh but has not been treated No goiter No concerns or any fatigue  He had colon cancer and is being monitored for colon cancer recurrence by GI.  He has not issues or concerns currently  Prediabetes He previously had a history of elevated sugars but no diabetes He is prediabetes They ate today  Lab Results  Component Value Date   HGBA1C 5.7 (H) 10/17/2016     Past Medical History:  Diagnosis Date  . Hx of adenomatous colonic polyps   . Iron deficiency anemia secondary to blood loss (chronic)   . Malignant neoplasm of ascending colon ([place])   . Partial small bowel obstruction ([place]) 12/29/2013    Past Surgical History:  Procedure Laterality Date  . CHOLECYSTECTOMY  1991  . HEMICOLECTOMY  01/15/03    Family History  Problem Relation Age of Onset  . Diabetes Mother   . Diabetes Brother     Social History   Socioeconomic History  . Marital status: Married    Spouse name: Not on file  . Number of children: Not on file  . Years of education: Not on file  . Highest education level: Not on file  Occupational History  . Not on file  Social Needs  . Financial resource strain: Not on file  . Food insecurity    Worry: Not on file    Inability: Not on file  . Transportation needs    Medical: Not on file    Non-medical: Not on file  Tobacco Use  . Smoking status: Never Smoker  . Smokeless tobacco: Never Used  Substance and Sexual Activity  . Alcohol use: Yes    Comment: occassional  . Drug use: No  . Sexual activity: Yes    Birth control/protection: None  Lifestyle  . Physical activity    Days per week: Not  on file    Minutes per session: Not on file  . Stress: Not on file  Relationships  . Social Herbalist on phone: Not on file    Gets together: Not on file    Attends religious service: Not on file    Active member of club or organization: Not on file    Attends meetings of clubs or organizations: Not on file    Relationship status: Not on file  . Intimate partner violence    Fear of current or ex partner: Not on file    Emotionally abused: Not on file    Physically abused: Not on file    Forced sexual activity: Not on file  Other Topics Concern  . Not on file  Social History Narrative  . Not on file    ROS Review of Systems Review of Systems  Constitutional: Negative for activity change, appetite change, chills and fever.  HENT: Negative for congestion, nosebleeds, trouble swallowing and voice change.   Respiratory: Negative for cough, shortness of breath and wheezing.   Gastrointestinal: Negative for diarrhea, nausea and vomiting.  Genitourinary: Negative for difficulty urinating, dysuria, flank pain and hematuria.  Musculoskeletal: Negative for back pain, joint swelling and  neck pain.  Neurological: Negative for dizziness, speech difficulty, light-headedness and numbness.  See HPI. All other review of systems negative.   Objective:   Today's Vitals: BP 122/60   Pulse 62   Temp 98.5 F (36.9 C) (Oral)   Ht _0  (1.727 m)   Wt 173 lb 12.8 oz (78.8 kg)   SpO2 97%   BMI 26.43 kg/m   Physical Exam  Physical Exam  Constitutional: Oriented to person, place, and time. Appears well-developed and well-nourished.  HENT:  Head: Normocephalic and atraumatic.  Eyes: Conjunctivae and EOM are normal.  Neck: Thyroid normal, neck supple Cardiovascular: Normal rate, regular rhythm, normal heart sounds and intact distal pulses.  No murmur heard. Pulmonary/Chest: Effort normal and breath sounds normal. No stridor. No respiratory distress. Has no wheezes.  Abdomen:  nondistended, normoactive bs, soft, nontender Neurological: Is alert and oriented to person, place, and time.  Skin: Skin is warm. Capillary refill takes less than 2 seconds.  Psychiatric: Has a normal mood and affect. Behavior is normal. Judgment and thought content normal.   Assessment & Plan:   Problem List Items Addressed This Visit      Endocrine   Subclinical hypothyroidism   Relevant Orders   TSH     Other   Pure hypercholesterolemia   Relevant Orders   Lipid panel   Prediabetes - discussed that will screen today for diabetes, continue exercise   Relevant Orders   CMP14+EGFR   Hemoglobin A1c    Other Visit Diagnoses    Need for prophylactic vaccination and inoculation against influenza    -  Primary   Relevant Orders   Flu Vaccine QUAD High Dose(Fluad) (Completed)   Need for prophylactic vaccination against Streptococcus pneumoniae (pneumococcus)       Abnormal TSH    - no diagnosis of hypothyroidism and no active symptoms   Relevant Orders   T4, Free      Outpatient Encounter Medications as of 10/24/2018  Medication Sig  . acetaminophen (TYLENOL) 325 MG tablet Take 2 tablets (650 mg total) by mouth every 6 (six) hours as needed.  Marland Kitchen atorvastatin (LIPITOR) 20 MG tablet TAKE 1 TABLET BY MOUTH EVERY DAY  . Cholecalciferol (VITAMIN D PO) Take by mouth.  . fish oil-omega-3 fatty acids 1000 MG capsule Take 2 g by mouth daily.  Marland Kitchen ibuprofen (ADVIL,MOTRIN) 200 MG tablet Take 200 mg by mouth every 6 (six) hours as needed for moderate pain.   No facility-administered encounter medications on file as of 10/24/2018.     Follow-up: No follow-ups on file.   Forrest Moron, MD

## 2018-10-24 NOTE — Patient Instructions (Addendum)
Today you received a flu shot You are due for a Pneumonia shot Today we checked the thyroid which was abnormal at the last checks If the thyroid is just borderline we can continue to watch it because he does not have any goiter or thyroid symptoms.  You can get your Medicare Wellness Exam check in 2021   If you have lab work done today you will be contacted with your lab results within the next 2 weeks.  If you have not heard from [place] then please contact us. The fastest way to get your results is to register for My Chart.   IF you received an x-ray today, you will receive an invoice from [facility]. Please contact [facility] at [phone] with questions or concerns regarding your invoice.   IF you received labwork today, you will receive an invoice from [place]. Please contact [facility] at [phone] with questions or concerns regarding your invoice.   Our billing staff will not be able to assist you with questions regarding bills from these companies.  You will be contacted with the lab results as soon as they are available. The fastest way to get your results is to activate your My Chart account. Instructions are located on the last page of this paperwork. If you have not heard from [place] regarding the results in 2 weeks, please contact this office.      Hypothyroidism  Hypothyroidism is when the thyroid gland does not make enough of certain hormones (it is underactive). The thyroid gland is a small gland located in the lower front part of the neck, just in front of the windpipe (trachea). This gland makes hormones that help control how the body uses food for energy (metabolism) as well as how the heart and brain function. These hormones also play a role in keeping your bones strong. When the thyroid is underactive, it produces too little of the hormones thyroxine (T4) and triiodothyronine (T3). What are the causes? This condition may be caused by:  Hashimoto's  disease. This is a disease in which the body's disease-fighting system (immune system) attacks the thyroid gland. This is the most common cause.  Viral infections.  Pregnancy.  Certain medicines.  Birth defects.  Past radiation treatments to the head or neck for cancer.  Past treatment with radioactive iodine.  Past exposure to radiation in the environment.  Past surgical removal of part or all of the thyroid.  Problems with a gland in the center of the brain (pituitary gland).  Lack of enough iodine in the diet. What increases the risk? You are more likely to develop this condition if:  You are male.  You have a family history of thyroid conditions.  You use a medicine called lithium.  You take medicines that affect the immune system (immunosuppressants). What are the signs or symptoms? Symptoms of this condition include:  Feeling as though you have no energy (lethargy).  Not being able to tolerate cold.  Weight gain that is not explained by a change in diet or exercise habits.  Lack of appetite.  Dry skin.  Coarse hair.  Menstrual irregularity.  Slowing of thought processes.  Constipation.  Sadness or depression. How is this diagnosed? This condition may be diagnosed based on:  Your symptoms, your medical history, and a physical exam.  Blood tests. You may also have imaging tests, such as an ultrasound or MRI. How is this treated? This condition is treated with medicine that replaces the thyroid hormones that your body  does not make. After you begin treatment, it may take several weeks for symptoms to go away. Follow these instructions at home:  Take over-the-counter and prescription medicines only as told by your health care provider.  If you start taking any new medicines, tell your health care provider.  Keep all follow-up visits as told by your health care provider. This is important. ? As your condition improves, your dosage of thyroid  hormone medicine may change. ? You will need to have blood tests regularly so that your health care provider can monitor your condition. Contact a health care provider if:  Your symptoms do not get better with treatment.  You are taking thyroid replacement medicine and you: ? Sweat a lot. ? Have tremors. ? Feel anxious. ? Lose weight rapidly. ? Cannot tolerate heat. ? Have emotional swings. ? Have diarrhea. ? Feel weak. Get help right away if you have:  Chest pain.  An irregular heartbeat.  A rapid heartbeat.  Difficulty breathing. Summary  Hypothyroidism is when the thyroid gland does not make enough of certain hormones (it is underactive).  When the thyroid is underactive, it produces too little of the hormones thyroxine (T4) and triiodothyronine (T3).  The most common cause is Hashimoto's disease, a disease in which the body's disease-fighting system (immune system) attacks the thyroid gland. The condition can also be caused by viral infections, medicine, pregnancy, or past radiation treatment to the head or neck.  Symptoms may include weight gain, dry skin, constipation, feeling as though you do not have energy, and not being able to tolerate cold.  This condition is treated with medicine to replace the thyroid hormones that your body does not make. This information is not intended to replace advice given to you by your health care provider. Make sure you discuss any questions you have with your health care provider. Document Released: 01/10/2005 Document Revised: 12/23/2016 Document Reviewed: 12/21/2016 Elsevier Patient Education  2020 [employer].

## 2018-10-25 LAB — CMP14+EGFR
ALT: 18 IU/L (ref 0–44)
AST: 20 IU/L (ref 0–40)
Albumin/Globulin Ratio: 1.8 (ref 1.2–2.2)
Albumin: 4.5 g/dL (ref 3.7–4.7)
Alkaline Phosphatase: 67 IU/L (ref 39–117)
BUN/Creatinine Ratio: 8 — ABNORMAL LOW (ref 10–24)
BUN: 6 mg/dL — ABNORMAL LOW (ref 8–27)
Bilirubin Total: 0.6 mg/dL (ref 0.0–1.2)
CO2: 24 mmol/L (ref 20–29)
Calcium: 9.1 mg/dL (ref 8.6–10.2)
Chloride: 103 mmol/L (ref 96–106)
Creatinine, Ser: 0.73 mg/dL — ABNORMAL LOW (ref 0.76–1.27)
GFR calc Af Amer: 107 mL/min/{1.73_m2} (ref >59)
GFR calc non Af Amer: 93 mL/min/{1.73_m2} (ref >59)
Globulin, Total: 2.5 g/dL (ref 1.5–4.5)
Glucose: 113 mg/dL — ABNORMAL HIGH (ref 65–99)
Potassium: 4.5 mmol/L (ref 3.5–5.2)
Sodium: 140 mmol/L (ref 134–144)
Total Protein: 7 g/dL (ref 6.0–8.5)

## 2018-10-25 LAB — LIPID PANEL
Chol/HDL Ratio: 3.8 ratio (ref 0.0–5.0)
Cholesterol, Total: 124 mg/dL (ref 100–199)
HDL: 33 mg/dL — ABNORMAL LOW (ref >39)
LDL Chol Calc (NIH): 67 mg/dL (ref 0–99)
Triglycerides: 133 mg/dL (ref 0–149)
VLDL Cholesterol Cal: 24 mg/dL (ref 5–40)

## 2018-10-25 LAB — TSH: TSH: 8.43 u[IU]/mL — ABNORMAL HIGH (ref 0.450–4.500)

## 2018-10-25 LAB — HEMOGLOBIN A1C
Est. average glucose Bld gHb Est-mCnc: 111 mg/dL
Hgb A1c MFr Bld: 5.5 % (ref 4.8–5.6)

## 2018-10-25 LAB — T4, FREE: Free T4: 0.96 ng/dL (ref 0.82–1.77)

## 2018-12-14 ENCOUNTER — Other Ambulatory Visit: Payer: Self-pay | Admitting: Family Medicine

## 2018-12-14 NOTE — Telephone Encounter (Signed)
Forwarding medication refill request to the clinical pool for review. 

## 2018-12-14 NOTE — Telephone Encounter (Signed)
Son calling to request refill  atorvastatin (LIPITOR) 20 MG tablet   [id number] [address], [place] - [place] [phone] (Phone) [phone] (Fax)

## 2018-12-19 ENCOUNTER — Other Ambulatory Visit: Payer: Self-pay | Admitting: Family Medicine

## 2018-12-19 MED ORDER — ATORVASTATIN CALCIUM 20 MG PO TABS: 20.0000 mg | ORAL_TABLET | Freq: Every day | ORAL | 0 refills | Status: DC

## 2018-12-19 NOTE — Telephone Encounter (Signed)
Copied from [place] [id number]. Topic: Quick Communication - Rx Refill/Question >> Dec 19, 2018 12:21 PM Leward Quan A wrote: Medication: atorvastatin (LIPITOR) 20 MG tablet   Request made on 12/14/2018 please advise   Has the patient contacted their pharmacy? Yes.   (Agent: If no, request that the patient contact the pharmacy for the refill.) (Agent: If yes, when and what did the pharmacy advise?)  Preferred Pharmacy (with phone number or street name): [id number] [address], [place] - [place] [phone] (Phone) [phone] (Fax)    Agent: Please be advised that RX refills may take up to 3 business days. We ask that you follow-up with your pharmacy.

## 2018-12-21 ENCOUNTER — Encounter: Payer: Self-pay | Admitting: Family Medicine

## 2018-12-21 ENCOUNTER — Other Ambulatory Visit: Payer: Self-pay

## 2018-12-21 ENCOUNTER — Ambulatory Visit (INDEPENDENT_AMBULATORY_CARE_PROVIDER_SITE_OTHER): Payer: Medicare HMO | Admitting: Family Medicine

## 2018-12-21 VITALS — BP 131/70 | HR 63 | Temp 98.2°F | Resp 17 | Ht 68.0 in | Wt 171.6 lb

## 2018-12-21 DIAGNOSIS — E7800 Pure hypercholesterolemia, unspecified: Secondary | ICD-10-CM

## 2018-12-21 MED ORDER — ATORVASTATIN CALCIUM 20 MG PO TABS: 20.0000 mg | ORAL_TABLET | Freq: Every day | ORAL | 3 refills | Status: DC

## 2018-12-21 NOTE — Progress Notes (Signed)
Established Patient Office Visit  Subjective:  Patient ID: Alan Riley, male    DOB: Jun 17, 1945  Age: 73 y.o. MRN: [id number]  CC:  Chief Complaint  Patient presents with   Medication Refill    lipitor    HPI Alan Riley presents for   Pt is here with his wife and reports that he did not get his lab results He needs refills of the lipitor He denies any side effects from the medication His cholesterol levels are good He is compliant with his medication and his diet Lab Results  Component Value Date   CHOL 124 10/24/2018   CHOL 119 01/12/2018   CHOL 130 10/17/2016   Lab Results  Component Value Date   HDL 33 (L) 10/24/2018   HDL 33 (L) 01/12/2018   HDL 37 (L) 10/17/2016   Lab Results  Component Value Date   LDLCALC 67 10/24/2018   [place] 57 01/12/2018   LDLCALC 69 10/17/2016   Lab Results  Component Value Date   TRIG 133 10/24/2018   TRIG 145 01/12/2018   TRIG 122 10/17/2016   Lab Results  Component Value Date   CHOLHDL 3.8 10/24/2018   CHOLHDL 3.6 01/12/2018   CHOLHDL 3.5 10/17/2016   No results found for: LDLDIRECT   Past Medical History:  Diagnosis Date   Hx of adenomatous colonic polyps    Iron deficiency anemia secondary to blood loss (chronic)    Malignant neoplasm of ascending colon ([facility])    Partial small bowel obstruction ([place]) 12/29/2013    Past Surgical History:  Procedure Laterality Date   CHOLECYSTECTOMY  1991   HEMICOLECTOMY  01/15/03    Family History  Problem Relation Age of Onset   Diabetes Mother    Diabetes Brother     Social History   Socioeconomic History   Marital status: Married    Spouse name: Not on file   Number of children: Not on file   Years of education: Not on file   Highest education level: Not on file  Occupational History   Not on file  Social Needs   Financial resource strain: Not on file   Food insecurity    Worry: Not on file    Inability: Not on file   Transportation  needs    Medical: Not on file    Non-medical: Not on file  Tobacco Use   Smoking status: Never Smoker   Smokeless tobacco: Never Used  Substance and Sexual Activity   Alcohol use: Yes    Comment: occassional   Drug use: No   Sexual activity: Yes    Birth control/protection: None  Lifestyle   Physical activity    Days per week: Not on file    Minutes per session: Not on file   Stress: Not on file  Relationships   Social connections    Talks on phone: Not on file    Gets together: Not on file    Attends religious service: Not on file    Active member of club or organization: Not on file    Attends meetings of clubs or organizations: Not on file    Relationship status: Not on file   Intimate partner violence    Fear of current or ex partner: Not on file    Emotionally abused: Not on file    Physically abused: Not on file    Forced sexual activity: Not on file  Other Topics Concern   Not on file  Social History Narrative  Not on file    Outpatient Medications Prior to Visit  Medication Sig Dispense Refill   acetaminophen (TYLENOL) 325 MG tablet Take 2 tablets (650 mg total) by mouth every 6 (six) hours as needed. 60 tablet 0   Cholecalciferol (VITAMIN D PO) Take by mouth.     fish oil-omega-3 fatty acids 1000 MG capsule Take 2 g by mouth daily.     ibuprofen (ADVIL,MOTRIN) 200 MG tablet Take 200 mg by mouth every 6 (six) hours as needed for moderate pain.     atorvastatin (LIPITOR) 20 MG tablet Take 1 tablet (20 mg total) by mouth daily. 90 tablet 0   No facility-administered medications prior to visit.     No Known Allergies  ROS Review of Systems    Objective:    Physical Exam  BP 131/70 (BP Location: Right Arm, Patient Position: Sitting, Cuff Size: Normal)    Pulse 63    Temp 98.2 F (36.8 C) (Oral)    Resp 17    Ht _0  (1.727 m)    Wt 171 lb 9.6 oz (77.8 kg)    SpO2 97%    BMI 26.09 kg/m  Wt Readings from Last 3 Encounters:  12/21/18 171  lb 9.6 oz (77.8 kg)  10/24/18 173 lb 12.8 oz (78.8 kg)  01/12/18 175 lb 4 oz (79.5 kg)     Health Maintenance Due  Topic Date Due   TETANUS/TDAP  05/02/1965   PNA vac Low Risk Adult (1 of 2 - PCV13) 05/03/2011    There are no preventive care reminders to display for this patient.  Lab Results  Component Value Date   TSH 8.430 (H) 10/24/2018   Lab Results  Component Value Date   WBC 7.0 01/12/2018   HGB 14.3 01/12/2018   HCT 41.6 01/12/2018   MCV 94 01/12/2018   PLT 181 01/12/2018   Lab Results  Component Value Date   NA 140 10/24/2018   K 4.5 10/24/2018   CHLORIDE 107 01/30/2014   CO2 24 10/24/2018   GLUCOSE 113 (H) 10/24/2018   BUN 6 (L) 10/24/2018   CREATININE 0.73 (L) 10/24/2018   BILITOT 0.6 10/24/2018   ALKPHOS 67 10/24/2018   AST 20 10/24/2018   ALT 18 10/24/2018   PROT 7.0 10/24/2018   ALBUMIN 4.5 10/24/2018   CALCIUM 9.1 10/24/2018   ANIONGAP 10 08/05/2014   EGFR >90 01/30/2014   Lab Results  Component Value Date   CHOL 124 10/24/2018   Lab Results  Component Value Date   HDL 33 (L) 10/24/2018   Lab Results  Component Value Date   LDLCALC 67 10/24/2018   Lab Results  Component Value Date   TRIG 133 10/24/2018   Lab Results  Component Value Date   CHOLHDL 3.8 10/24/2018   Lab Results  Component Value Date   HGBA1C 5.5 10/24/2018      Assessment & Plan:   Problem List Items Addressed This Visit      Other   Pure hypercholesterolemia - Primary   Relevant Medications   atorvastatin (LIPITOR) 20 MG tablet     Patient reassured of his labs being in good range. Continue current meds Reviewed and printed labs today Deactivated mychart since it is not being checked  Meds ordered this encounter  Medications   atorvastatin (LIPITOR) 20 MG tablet    Sig: Take 1 tablet (20 mg total) by mouth daily.    Dispense:  90 tablet    Refill:  3  Follow-up: Return for September 2021 for labs and med check .    Forrest Moron, MD

## 2018-12-21 NOTE — Patient Instructions (Addendum)
  1. Thyroid Written by Forrest Moron, MD on 10/30/2018 5:25 AM Although the TSH levels are abnormal the T4 is normal so we do not have to use medications. A normal T4 means the thyroid gland is still working. If the TSH goes above 10 then we consider some medication to supplement the thyroid. At this time the recommendation is not treating patients over 70 with additional thyroid meds unless they are feeling sick or their TSH is greater than 10.  Lab Results  Component Value Date   TSH 8.430 (H) 10/24/2018   T4TOTAL 6.1 10/17/2016    2. The rest of the results were good Lab Results  Component Value Date   HGBA1C 5.5 10/24/2018   No prediabetes was noted   3. Your cholesterol was very good and the medication lipitor is working. Keep up the good work and continue a healthy diet. Lab Results  Component Value Date   CHOL 124 10/24/2018   CHOL 119 01/12/2018   CHOL 130 10/17/2016   Lab Results  Component Value Date   HDL 33 (L) 10/24/2018   HDL 33 (L) 01/12/2018   HDL 37 (L) 10/17/2016   Lab Results  Component Value Date   LDLCALC 67 10/24/2018   LDLCALC 57 01/12/2018   LDLCALC 69 10/17/2016   Lab Results  Component Value Date   TRIG 133 10/24/2018   TRIG 145 01/12/2018   TRIG 122 10/17/2016   Lab Results  Component Value Date   CHOLHDL 3.8 10/24/2018   CHOLHDL 3.6 01/12/2018   CHOLHDL 3.5 10/17/2016   No results found for: LDLDIRECT  4. Your kidney function and liver function are very good.    If you have lab work done today you will be contacted with your lab results within the next 2 weeks.  If you have not heard from [place] then please contact us. The fastest way to get your results is to register for My Chart.   IF you received an x-ray today, you will receive an invoice from [facility]. Please contact [facility] at [phone] with questions or concerns regarding your invoice.   IF you received labwork today, you will receive an  invoice from [place]. Please contact [facility] at [phone] with questions or concerns regarding your invoice.   Our billing staff will not be able to assist you with questions regarding bills from these companies.  You will be contacted with the lab results as soon as they are available. The fastest way to get your results is to activate your My Chart account. Instructions are located on the last page of this paperwork. If you have not heard from [place] regarding the results in 2 weeks, please contact this office.

## 2019-02-14 ENCOUNTER — Telehealth: Payer: Self-pay | Admitting: *Deleted

## 2019-02-14 NOTE — Telephone Encounter (Signed)
Declined AWV  °

## 2019-06-04 DIAGNOSIS — Z85038 Personal history of other malignant neoplasm of large intestine: Secondary | ICD-10-CM | POA: Diagnosis not present

## 2019-06-04 DIAGNOSIS — D334 Benign neoplasm of spinal cord: Secondary | ICD-10-CM | POA: Diagnosis not present

## 2019-06-04 DIAGNOSIS — E7800 Pure hypercholesterolemia, unspecified: Secondary | ICD-10-CM | POA: Diagnosis not present

## 2019-06-04 DIAGNOSIS — M47812 Spondylosis without myelopathy or radiculopathy, cervical region: Secondary | ICD-10-CM | POA: Diagnosis not present

## 2019-06-04 DIAGNOSIS — R29898 Other symptoms and signs involving the musculoskeletal system: Secondary | ICD-10-CM | POA: Diagnosis not present

## 2019-06-04 DIAGNOSIS — Z85828 Personal history of other malignant neoplasm of skin: Secondary | ICD-10-CM | POA: Diagnosis not present

## 2019-06-06 ENCOUNTER — Other Ambulatory Visit: Payer: Self-pay | Admitting: Family Medicine

## 2019-06-06 DIAGNOSIS — D334 Benign neoplasm of spinal cord: Secondary | ICD-10-CM

## 2019-06-13 ENCOUNTER — Other Ambulatory Visit: Payer: Self-pay

## 2019-06-13 ENCOUNTER — Ambulatory Visit
Admission: RE | Admit: 2019-06-13 | Discharge: 2019-06-13 | Disposition: A | Discharge status: Home / Self Care | Payer: Medicare HMO | Source: Ambulatory Visit | Attending: Family Medicine | Admitting: Family Medicine

## 2019-06-13 DIAGNOSIS — D334 Benign neoplasm of spinal cord: Secondary | ICD-10-CM

## 2019-06-14 DIAGNOSIS — M545 Low back pain: Secondary | ICD-10-CM | POA: Diagnosis not present

## 2019-06-14 DIAGNOSIS — M5440 Lumbago with sciatica, unspecified side: Secondary | ICD-10-CM | POA: Diagnosis not present

## 2019-06-14 DIAGNOSIS — M5136 Other intervertebral disc degeneration, lumbar region: Secondary | ICD-10-CM | POA: Diagnosis not present

## 2019-06-14 DIAGNOSIS — D334 Benign neoplasm of spinal cord: Secondary | ICD-10-CM | POA: Diagnosis not present

## 2019-06-17 ENCOUNTER — Other Ambulatory Visit: Payer: Self-pay | Admitting: Family Medicine

## 2019-06-17 DIAGNOSIS — M5136 Other intervertebral disc degeneration, lumbar region: Secondary | ICD-10-CM

## 2019-06-17 DIAGNOSIS — D334 Benign neoplasm of spinal cord: Secondary | ICD-10-CM

## 2019-06-17 DIAGNOSIS — M5440 Lumbago with sciatica, unspecified side: Secondary | ICD-10-CM

## 2019-06-18 ENCOUNTER — Ambulatory Visit
Admission: RE | Admit: 2019-06-18 | Discharge: 2019-06-18 | Disposition: A | Discharge status: Home / Self Care | Payer: Medicare HMO | Source: Ambulatory Visit | Attending: Family Medicine | Admitting: Family Medicine

## 2019-06-18 ENCOUNTER — Other Ambulatory Visit: Payer: Self-pay

## 2019-06-18 DIAGNOSIS — D334 Benign neoplasm of spinal cord: Secondary | ICD-10-CM

## 2019-06-18 DIAGNOSIS — M4802 Spinal stenosis, cervical region: Secondary | ICD-10-CM | POA: Diagnosis not present

## 2019-06-18 IMAGING — MR MR CERVICAL SPINE WO/W CM
4 of 8 series · 23 of 48 positions shown · IV contrast (15ml multihance)
Comparison: Cervical spine radiographs [DATE]

CLINICAL DATA: Benign neoplasm of spinal cord with prior resection.
Right arm and hand weakness. History of colon cancer.

EXAM:
MRI CERVICAL SPINE WITHOUT AND WITH CONTRAST
TECHNIQUE: Multiplanar and multiecho pulse sequences of the cervical spine, to
include the craniocervical junction and cervicothoracic junction,
were obtained without and with intravenous contrast.
CONTRAST:  15mL MULTIHANCE GADOBENATE DIMEGLUMINE 529 MG/ML IV SOLN

[Series 3: T1 · sagittal · 3.0mm · 0.41mm/px · 4 of 13 slices shown (1 of 2)]
[im 1/13]
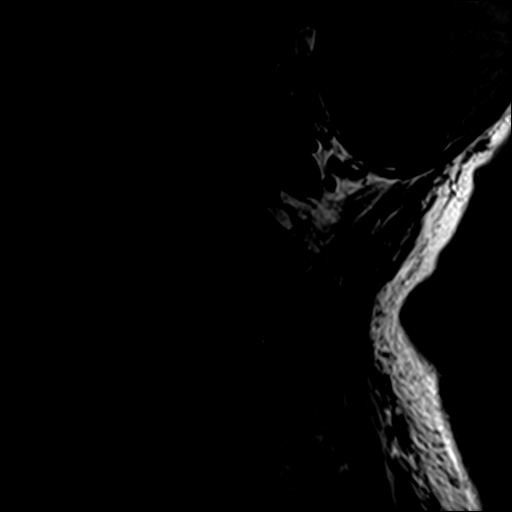
[im 5/13]
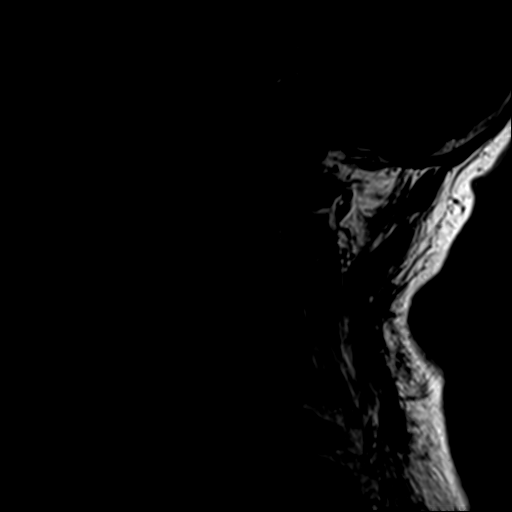
[im 9/13]
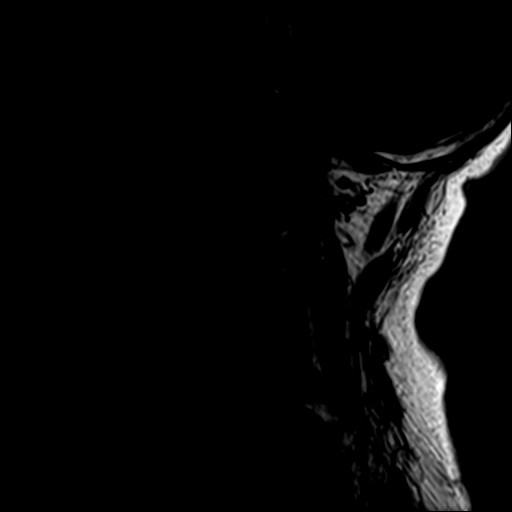
[im 13/13]
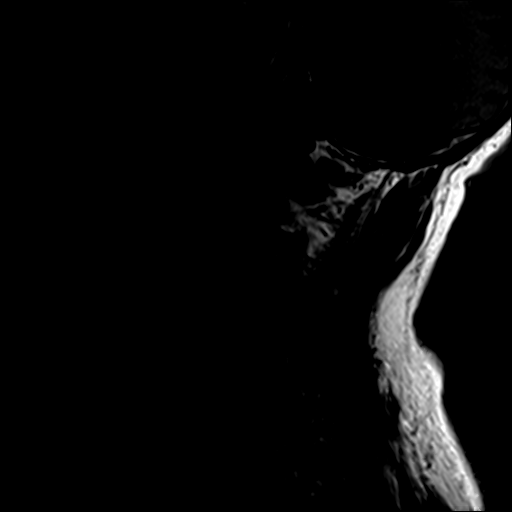

[Series 5: T2 · axial · 3.0mm · 0.70mm/px · z∈[-85,+25]mm · 8 of 31 slices shown (1 of 2)]
[im 1/31]
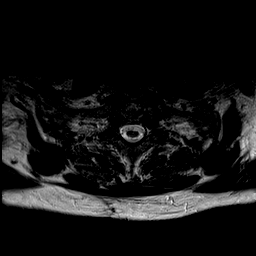
[im 5/31]
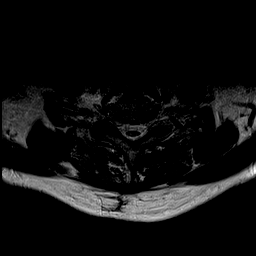
[im 9/31]
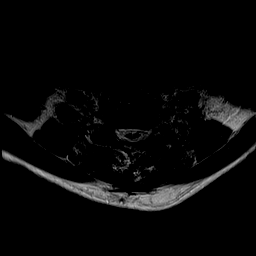
[im 13/31]
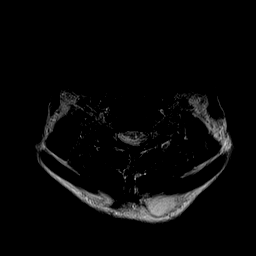
[im 18/31]
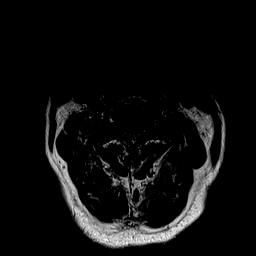
[im 22/31]
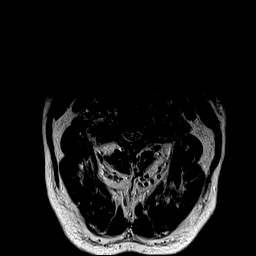
[im 26/31]
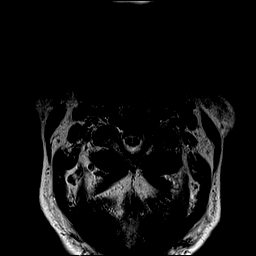
[im 31/31]
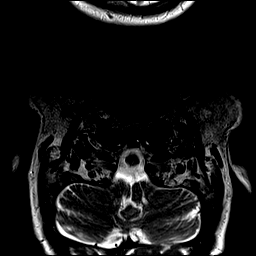

[Series 6: T1 · axial · 3.0mm · 0.35mm/px · z∈[-85,+7]mm · 7 of 31 slices shown (2 of 2)]
[im 1/31]
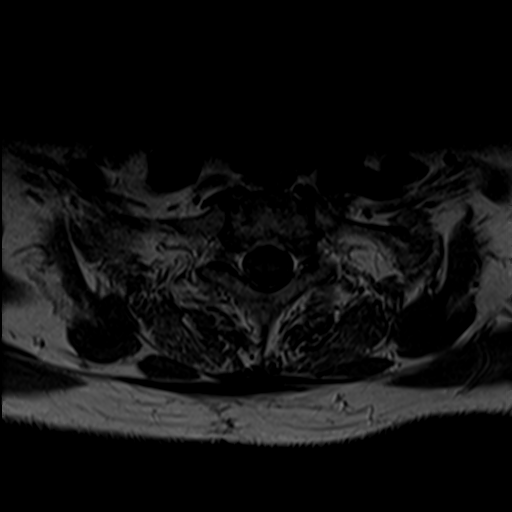
[im 5/31]
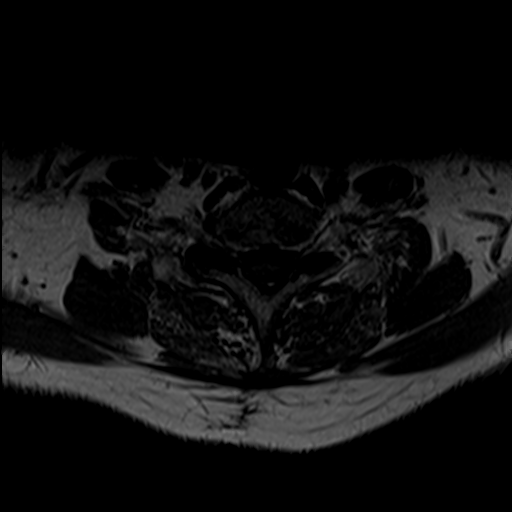
[im 9/31]
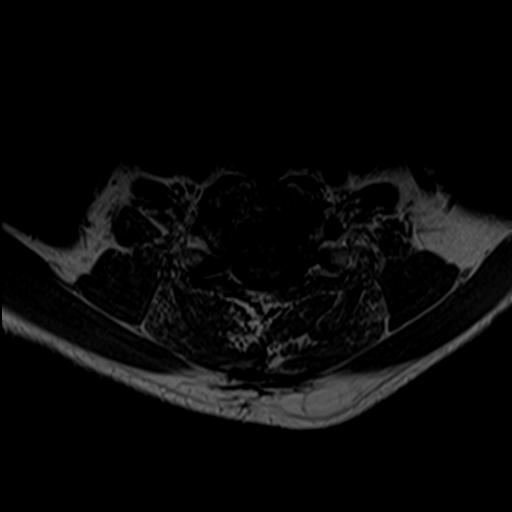
[im 13/31]
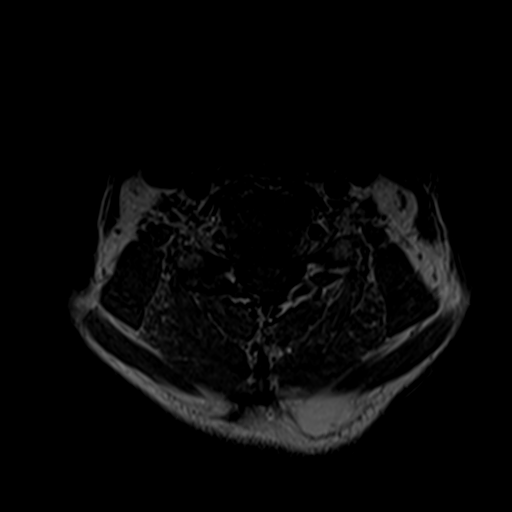
[im 18/31]
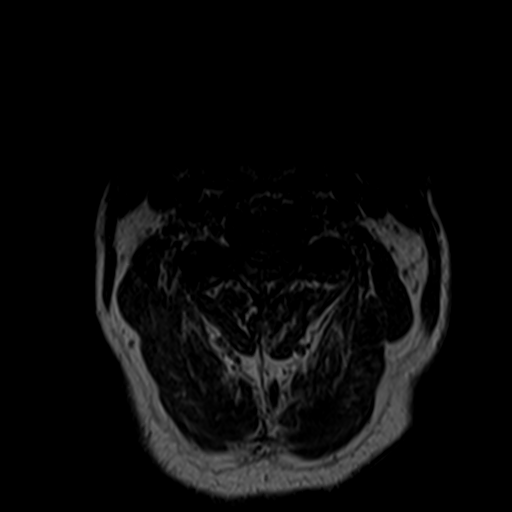
[im 22/31]
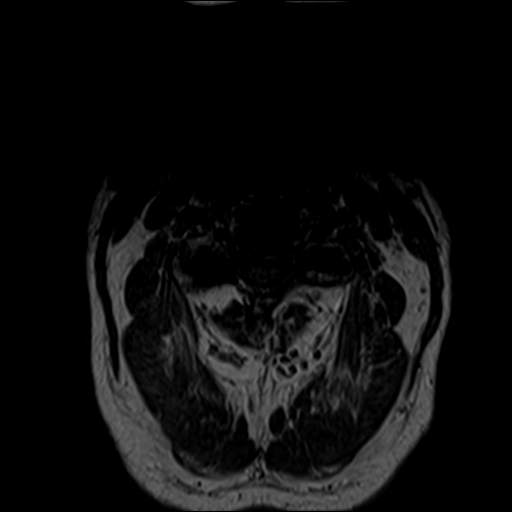
[im 26/31]
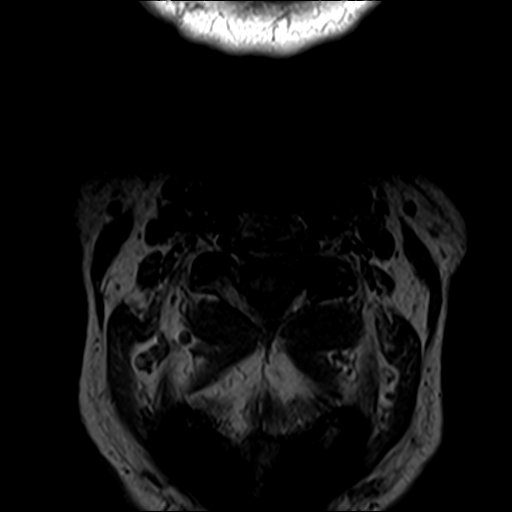

[Series 7: T2 · sagittal · 3.0mm · 0.41mm/px · 4 of 13 slices shown (2 of 2)]
[im 1/13]
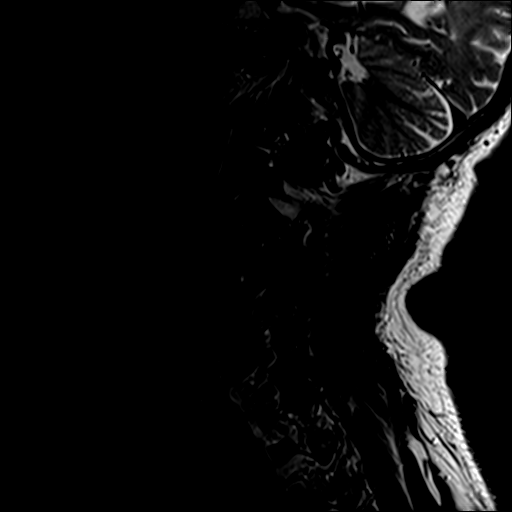
[im 5/13]
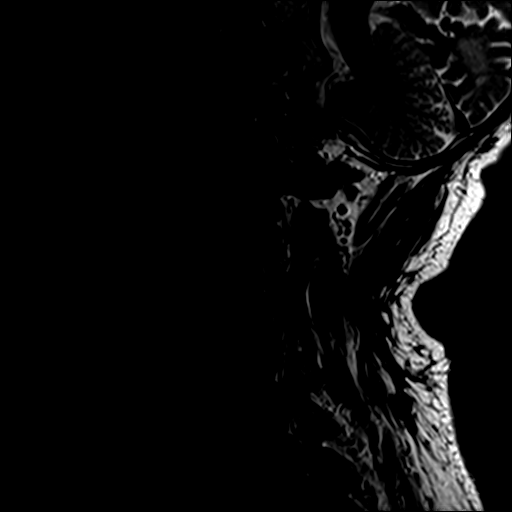
[im 9/13]
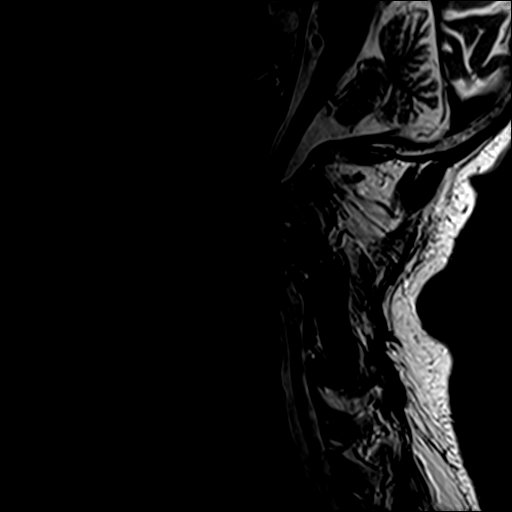
[im 13/13]
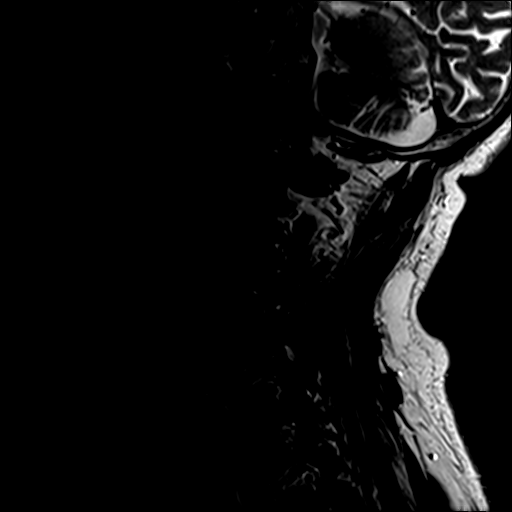

[23 of 48 positions shown; findings below may reference images not displayed]

FINDINGS: Alignment: Normal alignment with straightening of the cervical
lordosis

The patient was not able to hold still and there is significant
image degradation due to motion.

Vertebrae: Negative for fracture or mass. Normal enhancement of the
spine. Patchy bone marrow pattern with a benign appearance.

Cord: Cord compression at C4-5 without abnormal signal

Cord atrophy and extensive cord hyperintensity at C5 and C6
bilaterally. No significant spinal stenosis at this level with prior
laminectomy.

Posterior Fossa, vertebral arteries, paraspinal tissues: Negative
for mass or adenopathy in the neck. Normal flow voids in the
vertebral arteries. Posterior fossa negative.

Disc levels:

C2-3: Small central disc protrusion and bilateral facet
degeneration. Negative for stenosis

C3-4: Disc degeneration and diffuse uncinate spurring. Moderate
facet hypertrophy bilaterally. Moderate spinal stenosis and moderate
foraminal stenosis bilaterally

C4-5: Broad-based disc protrusion. Moderate facet hypertrophy.
Severe spinal stenosis with compression of the cord and moderate
foraminal encroachment bilaterally due to spurring.

C5-6: Posterior decompression. Disc degeneration with diffuse
uncinate spurring. Moderate foraminal stenosis bilaterally due to
spurring. Spinal canal adequately decompressed.

C6-7: Posterior laminectomy and decompression of the canal. Disc
degeneration and spurring with moderate foraminal encroachment
bilaterally due to spurring

C7-T1: Disc degeneration and uncinate spurring. Bilateral facet
degeneration. Moderate foraminal stenosis bilaterally.
IMPRESSION: 1. Negative for fracture or mass in the cervical spine. Negative for
metastatic disease
2. Severe multilevel degenerative change throughout the cervical
spine as above. Multilevel spinal and foraminal encroachment due to
spurring
3. Severe spinal stenosis C4-5 with cord compression and moderate
foraminal encroachment bilaterally
4. Posterior decompression at C5 and C6. Chronic myelomalacia in the
spinal cord at this level. No recurrent tumor.
5. These results were called by telephone at the time of
interpretation on [DATE] at [DATE] to provider WILLANDER ,
who verbally acknowledged these results.

## 2019-06-18 MED ORDER — GADOBENATE DIMEGLUMINE 529 MG/ML IV SOLN
15.0000 mL | Freq: Once | INTRAVENOUS | Status: AC | PRN
  Administered 2019-06-18: 15 mL via INTRAVENOUS

## 2019-06-20 ENCOUNTER — Other Ambulatory Visit: Payer: Self-pay

## 2019-06-20 ENCOUNTER — Other Ambulatory Visit (HOSPITAL_COMMUNITY): Payer: Self-pay | Admitting: Student

## 2019-06-20 ENCOUNTER — Ambulatory Visit (HOSPITAL_COMMUNITY)
Admission: RE | Admit: 2019-06-20 | Discharge: 2019-06-20 | Disposition: A | Discharge status: Home / Self Care | Payer: Medicare HMO | Source: Ambulatory Visit | Attending: Student | Admitting: Student

## 2019-06-20 DIAGNOSIS — G959 Disease of spinal cord, unspecified: Secondary | ICD-10-CM | POA: Insufficient documentation

## 2019-06-20 DIAGNOSIS — M5124 Other intervertebral disc displacement, thoracic region: Secondary | ICD-10-CM | POA: Diagnosis not present

## 2019-06-20 DIAGNOSIS — M48061 Spinal stenosis, lumbar region without neurogenic claudication: Secondary | ICD-10-CM | POA: Diagnosis not present

## 2019-06-20 DIAGNOSIS — M5126 Other intervertebral disc displacement, lumbar region: Secondary | ICD-10-CM | POA: Diagnosis not present

## 2019-06-20 IMAGING — MR MR THORACIC SPINE WO/W CM
6 of 10 series · 28 of 48 positions shown · IV contrast (gadavist)
Comparison: None.

CLINICAL DATA: Myelopathy. Remote history of prior resection of a
benign neoplasm of the spinal cord. Right arm and hand weakness.
History of colon cancer.

EXAM:
MRI THORACIC WITHOUT AND WITH CONTRAST
TECHNIQUE: Multiplanar and multiecho pulse sequences of the thoracic spine were
obtained without and with intravenous contrast.
CONTRAST:  7.8mL GADAVIST GADOBUTROL 1 MMOL/ML IV SOLN

[Series 14: T1 · sagittal · 6.0mm · 1.23mm/px · 2 of 10 slices shown (1 of 3)]
[im 1/10]
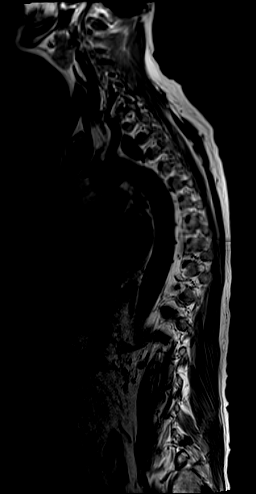
[im 10/10]
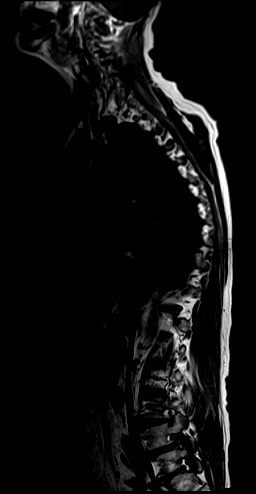

[Series 15: T2 · sagittal · 3.0mm · 0.76mm/px · 4 of 21 slices shown (1 of 2)]
[im 1/21]
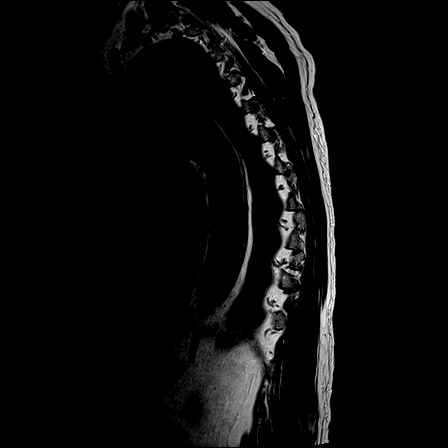
[im 7/21]
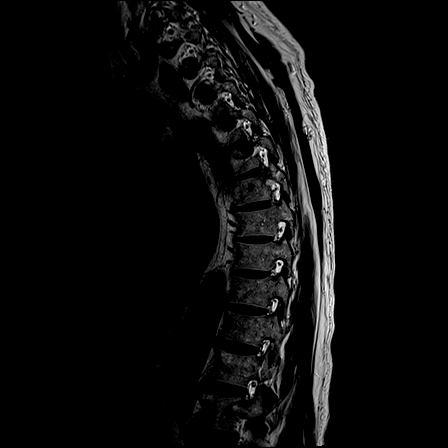
[im 14/21]
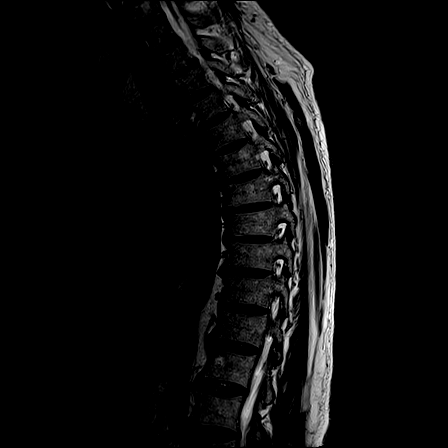
[im 21/21]
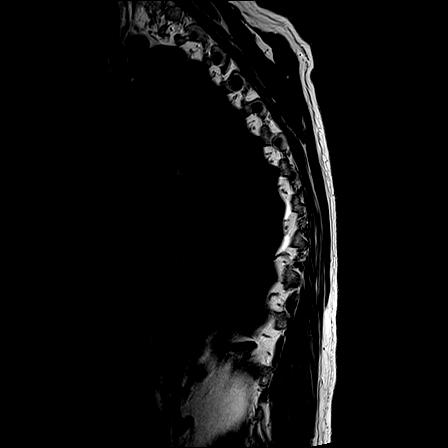

[Series 16: T1 · sagittal · 3.0mm · 0.76mm/px · 4 of 21 slices shown (2 of 3)]
[im 1/21]
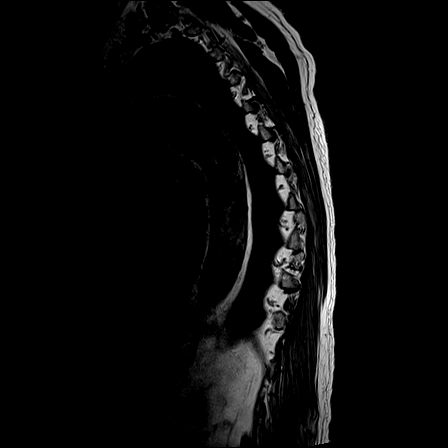
[im 7/21]
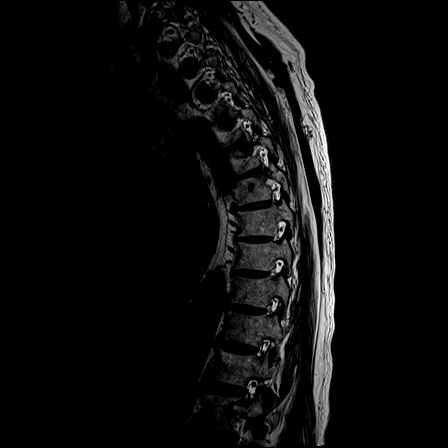
[im 14/21]
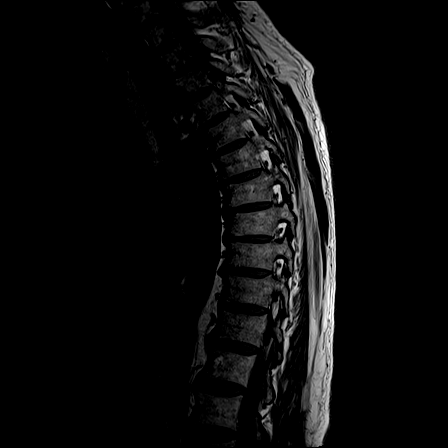
[im 21/21]
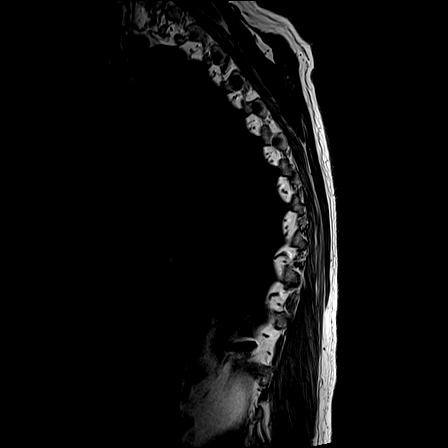

[Series 18: T2 · axial · 4.0mm · 0.74mm/px · z∈[-198,+39]mm · 7 of 39 slices shown (2 of 2)]
[im 1/39]
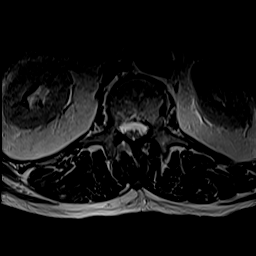
[im 7/39]
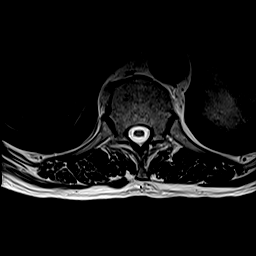
[im 13/39]
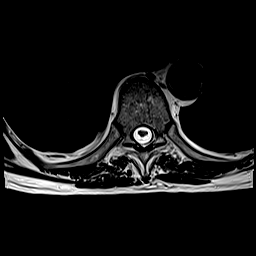
[im 20/39]
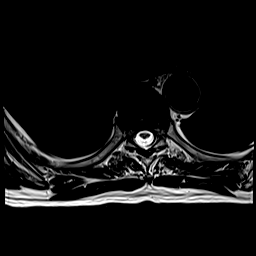
[im 26/39]
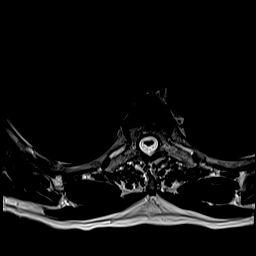
[im 32/39]
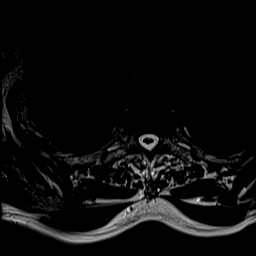
[im 39/39]
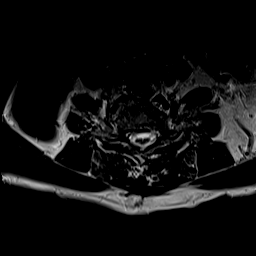

[Series 20: T1 · axial · non-contrast · 4.0mm · 0.37mm/px · z∈[-198,+39]mm · 7 of 39 slices shown (3 of 3)]
[im 1/39]
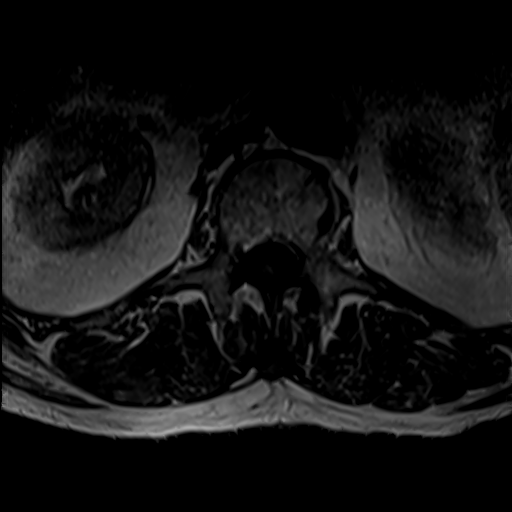
[im 7/39]
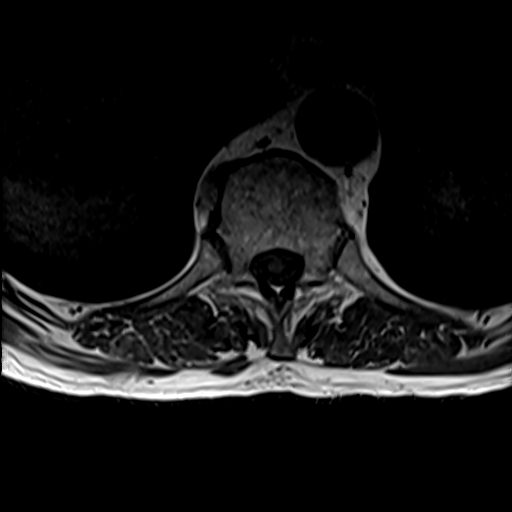
[im 13/39]
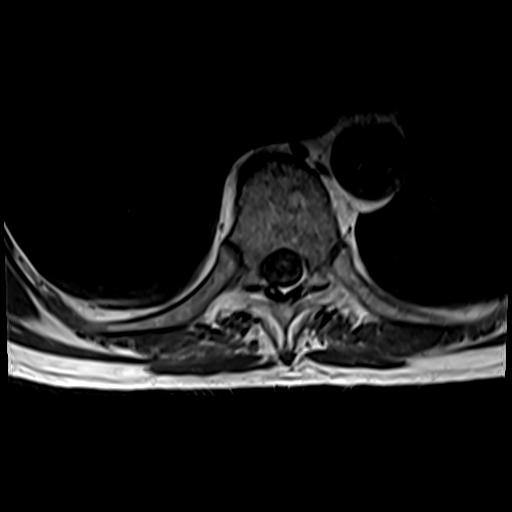
[im 20/39]
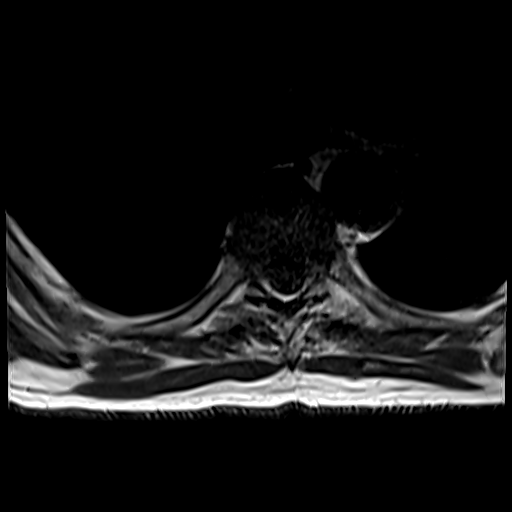
[im 26/39]
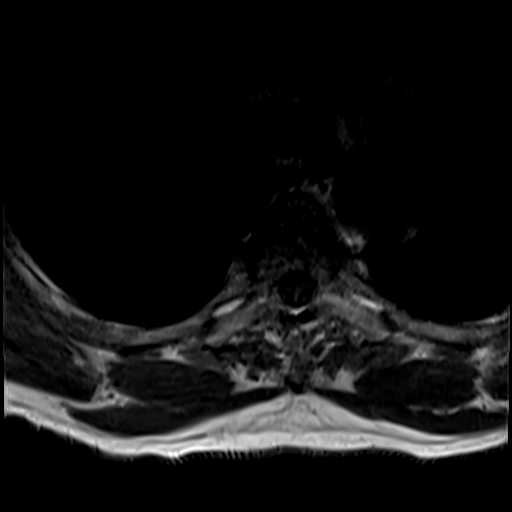
[im 32/39]
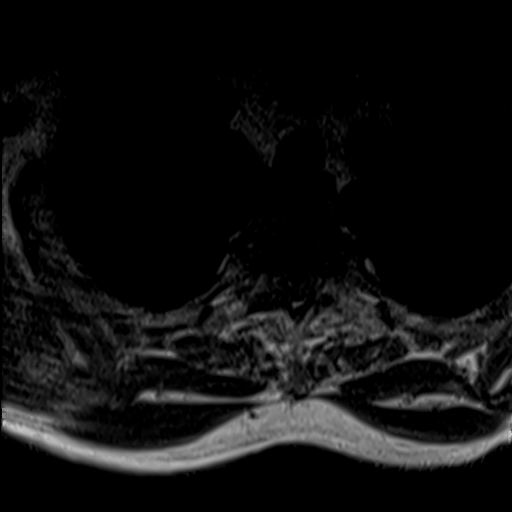
[im 39/39]
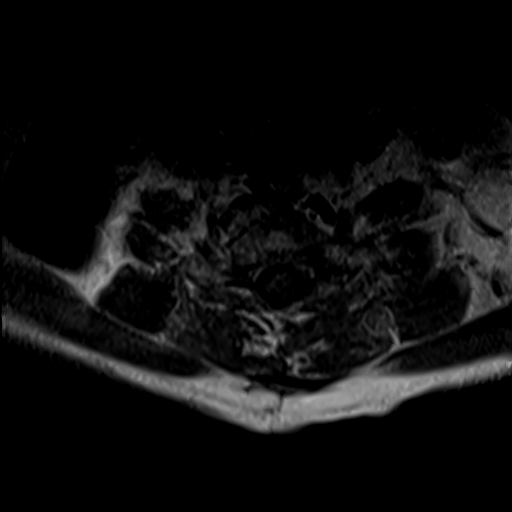

[Series 22: T1 fat-sat post-contrast · sagittal · 3.0mm · 0.76mm/px · 4 of 21 slices shown]
[im 1/21]
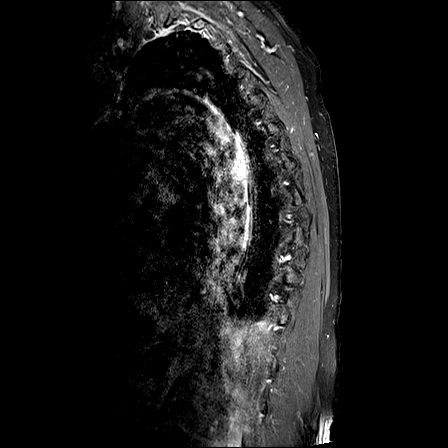
[im 7/21]
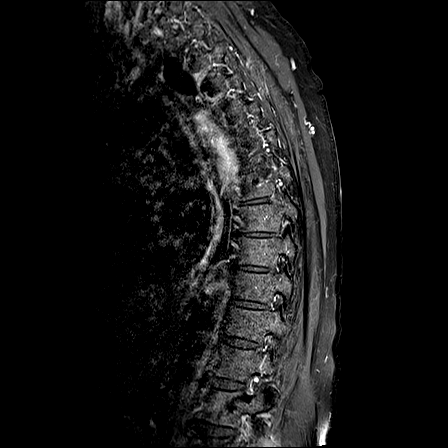
[im 14/21]
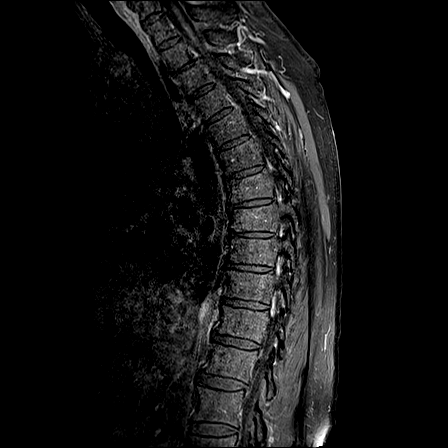
[im 21/21]
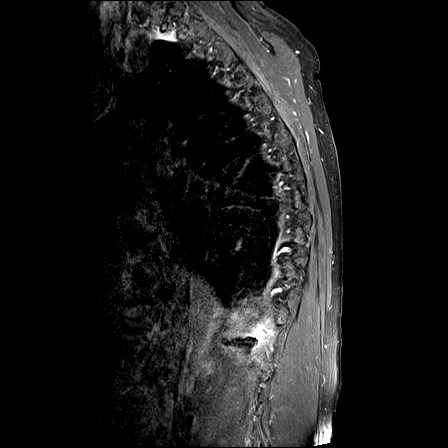

[28 of 48 positions shown; findings below may reference images not displayed]

FINDINGS: MRI THORACIC SPINE FINDINGS

Alignment:  Physiologic.

Vertebrae: No fracture, evidence of discitis, or bone lesion.

Cord: No myelopathy. Slight indentation upon the ventral aspect of
the spinal cord at T4-5 and T5-6 without discrete myelopathy. No
spinal or significant foraminal stenoses.

Paraspinal and other soft tissues: Negative.

Disc levels:

T1-2: Negative.

T2-3: Small broad-based disc bulge with no neural impingement.

T3-4: Normal.

T4-5: Small central subligamentous disc protrusion slightly
compressing the ventral aspect of the spinal cord without
myelopathy.

T5-6: Small central subligamentous disc protrusion slightly
compressing the ventral aspect the spinal cord without myelopathy.

T6-7 through T12-L1: Negative.
IMPRESSION: 1. Small central subligamentous disc protrusions at T4-5 and T5-6
slightly compressing the ventral aspect of the spinal cord without
myelopathy.
2. No other significant abnormalities.

## 2019-06-20 IMAGING — MR MR LUMBAR SPINE WO/W CM
6 of 7 series · 31 of 48 positions shown · IV contrast (gadavist)
Comparison: CT scan of the abdomen and pelvis dated [DATE]

CLINICAL DATA: Myelopathy.  Patient is unable to walk.

EXAM:
MRI LUMBAR SPINE WITHOUT AND WITH CONTRAST
TECHNIQUE: Multiplanar and multiecho pulse sequences of the lumbar spine were
obtained without and with intravenous contrast.
CONTRAST:  7.8mL GADAVIST GADOBUTROL 1 MMOL/ML IV SOLN

[Series 5: T2 · sagittal · 4.0mm · 0.73mm/px · 5 of 16 slices shown (1 of 2)]
[im 1/16]
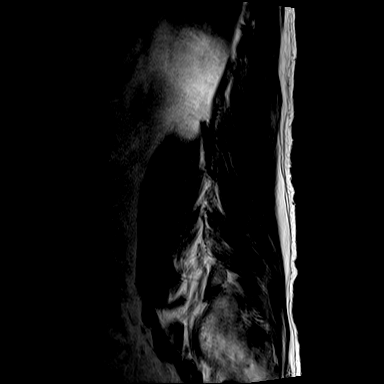
[im 4/16]
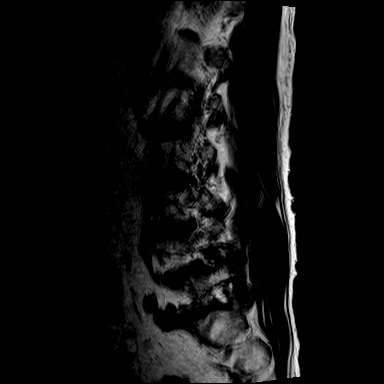
[im 8/16]
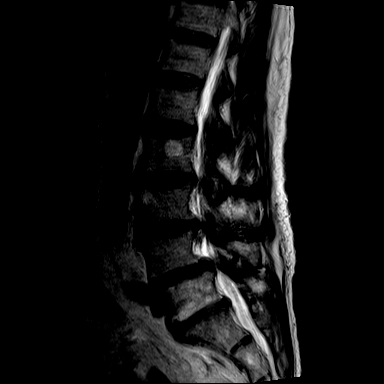
[im 12/16]
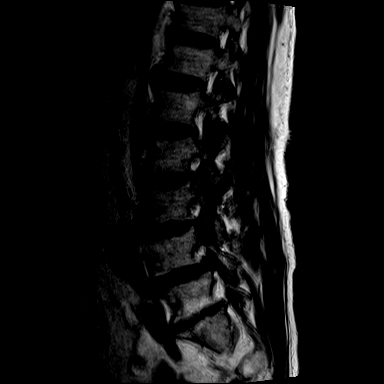
[im 16/16]
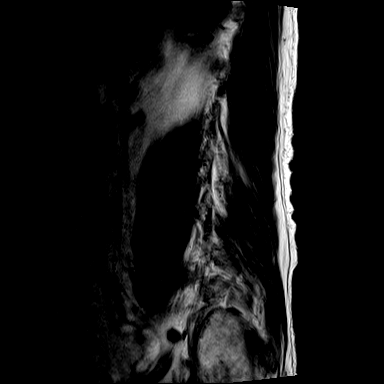

[Series 6: STIR · sagittal · 4.0mm · 0.55mm/px · 2 of 16 slices shown]
[im 1/16]
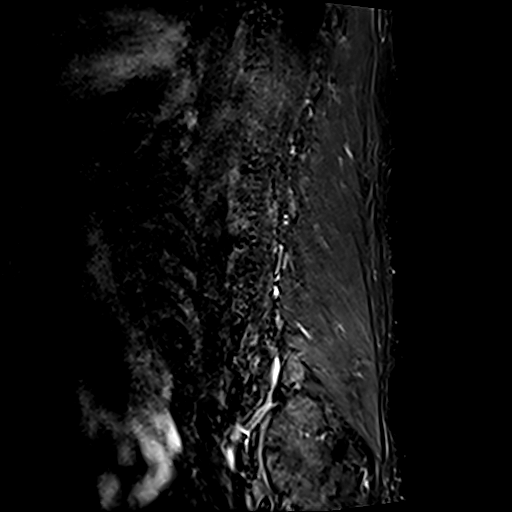
[im 4/16]
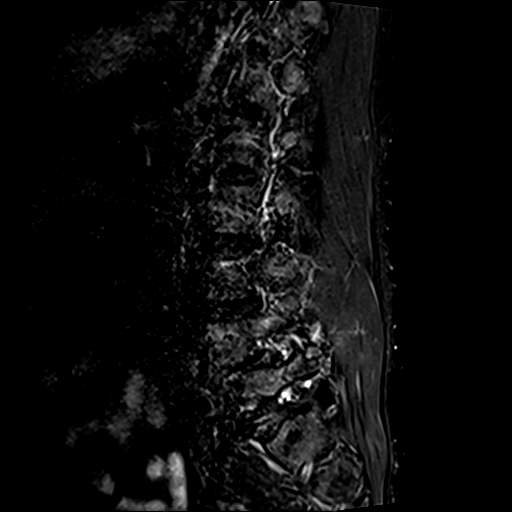

[Series 7: T1 · sagittal · 4.0mm · 0.88mm/px · 4 of 16 slices shown (1 of 2)]
[im 1/16]
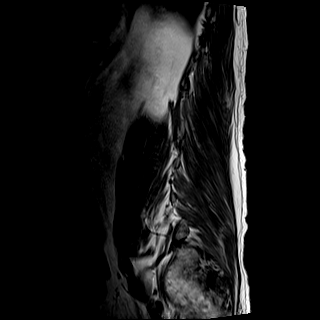
[im 6/16]
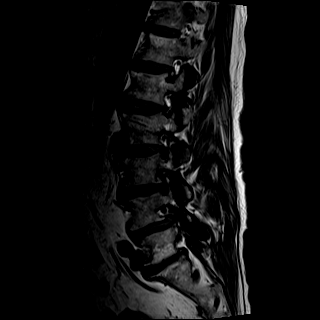
[im 11/16]
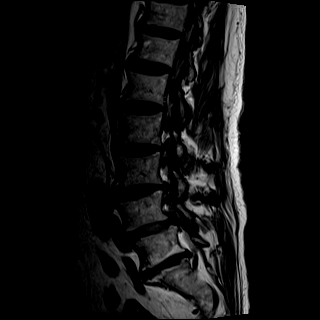
[im 16/16]
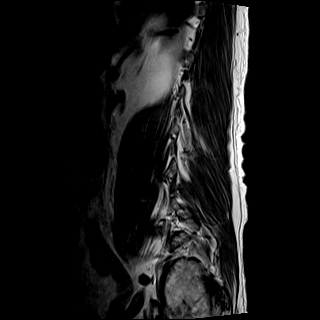

[Series 8: T2 · axial · 4.0mm · 0.86mm/px · z∈[-393,-185]mm · 8 of 36 slices shown (2 of 2)]
[im 1/36]
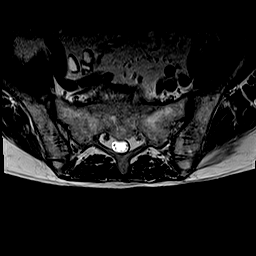
[im 4/36]
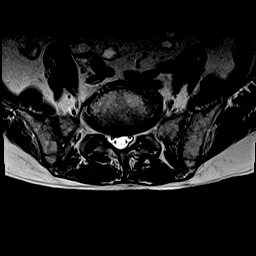
[im 12/36]
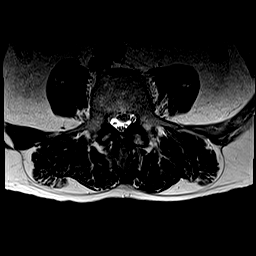
[im 16/36]
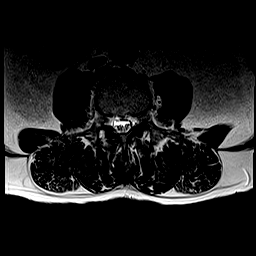
[im 20/36]
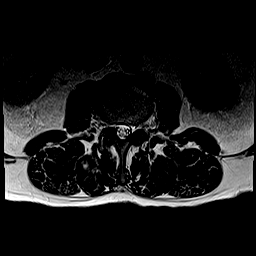
[im 24/36]
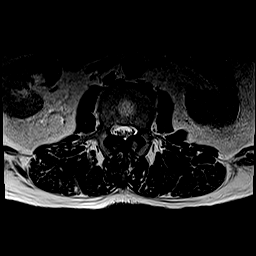
[im 32/36]
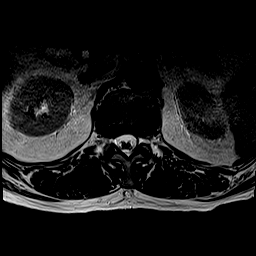
[im 36/36]
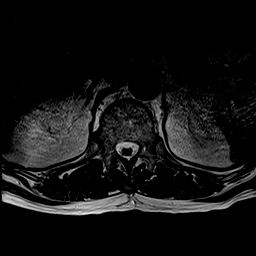

[Series 9: T1 · axial · 4.0mm · 0.43mm/px · z∈[-393,-185]mm · 8 of 36 slices shown (2 of 2)]
[im 1/36]
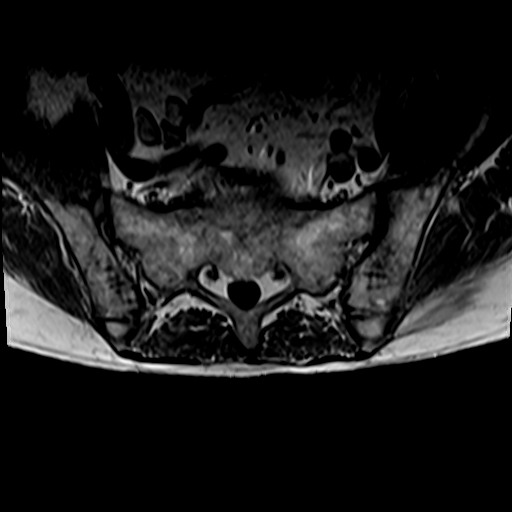
[im 4/36]
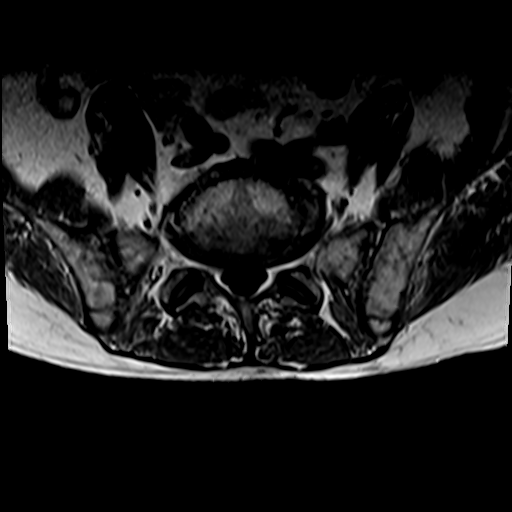
[im 12/36]
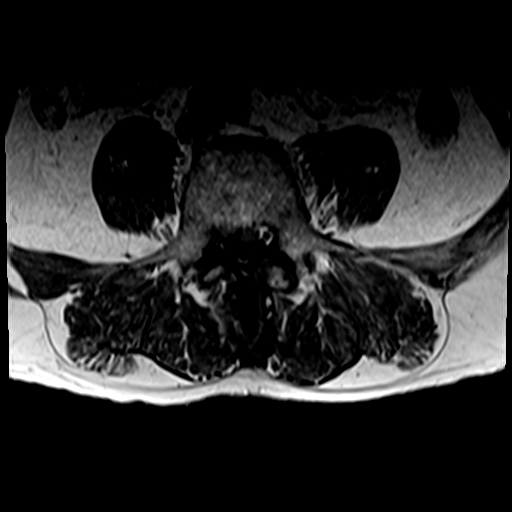
[im 16/36]
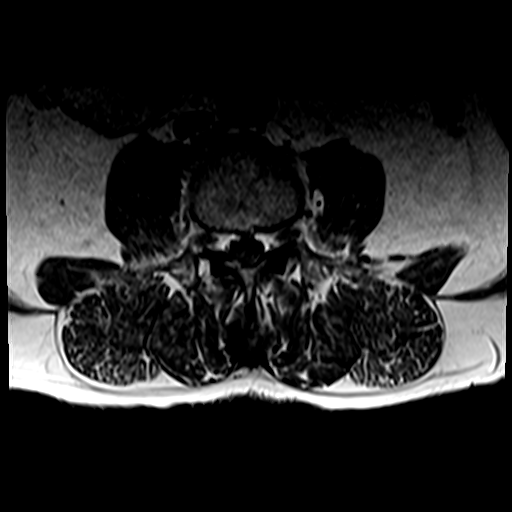
[im 20/36]
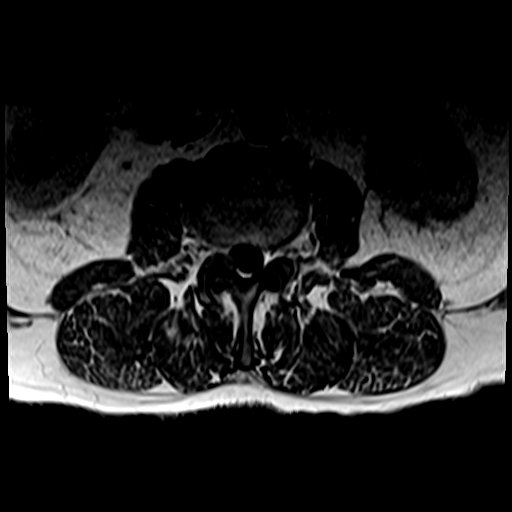
[im 24/36]
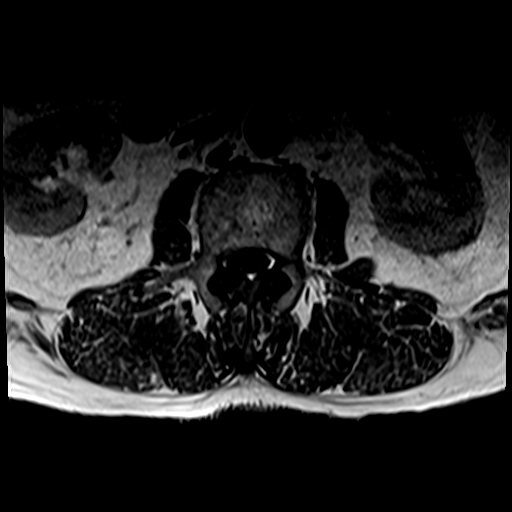
[im 32/36]
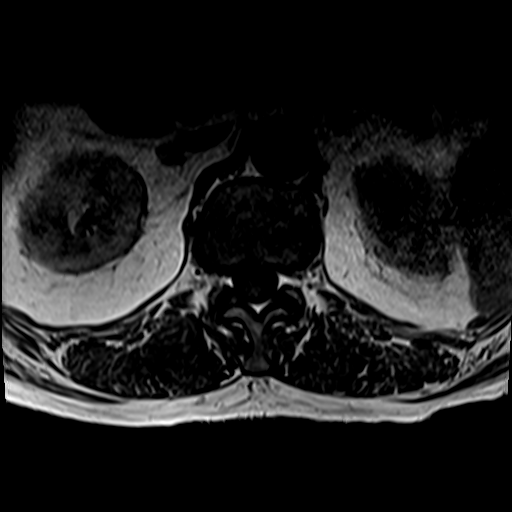
[im 36/36]
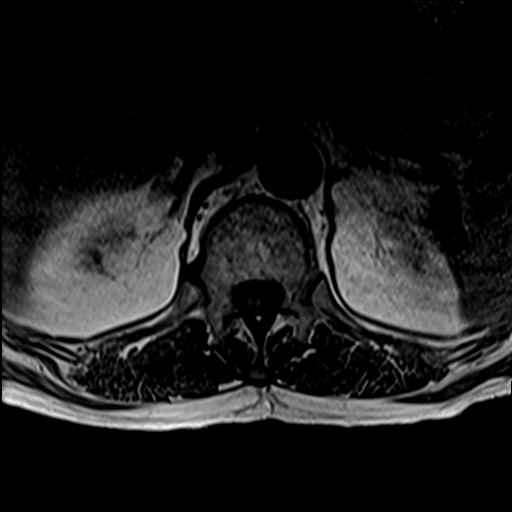

[Series 10: T1 fat-sat post-contrast · sagittal · 4.0mm · 1.09mm/px · 4 of 16 slices shown]
[im 1/16]
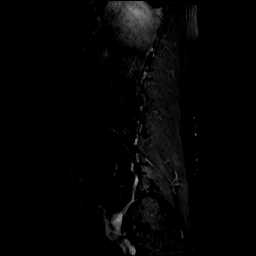
[im 6/16]
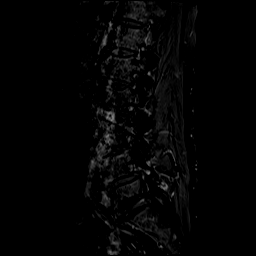
[im 11/16]
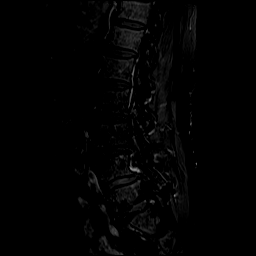
[im 16/16]
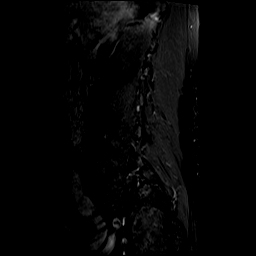

[31 of 48 positions shown; findings below may reference images not displayed]

FINDINGS: Segmentation:  5 lumbar type vertebra.

Alignment:  7 mm spondylolisthesis of L4 on L5.  Otherwise normal.

Vertebrae: No fracture, evidence of discitis, or significant bone
lesion. Benign hemangiomata in the L2 and L5 vertebral bodies.

Conus medullaris: Conus extends to the L1-2 level. Conus appears
normal.

Paraspinal and other soft tissues: Negative.

Disc levels:

T12-L1: Normal.

L1-2: Normal disc. Slight hypertrophy of the ligamentum flavum and
facet joints without neural impingement.

L2-3: Slight disc desiccation. Small disc bulges to the right and
left of midline with accompanying osteophytes. Slight hypertrophy of
the ligamentum flavum and facet joints create mild spinal stenosis
and slight compression of the left lateral recess visible on image
19 of series 8.

L3-4: There is a small broad-based disc protrusion asymmetric to the
right but also extending into the left lateral recess and left
neural foramen. There is a prominent overlying epidural hematoma
best seen on image 23 of series 9 which severely compresses the
thecal sac. Slight hypertrophy of the ligamentum flavum contributes
to the compression of the thecal sac.

L4-5: 7 mm spondylolisthesis due to severe bilateral facet
arthritis. Combine with hypertrophy of the ligamentum flavum this
creates moderately severe spinal stenosis and narrowing of both
lateral recesses. Moderate right foraminal stenosis.

L5-S1: Chronic disc space narrowing. Small broad-based disc bulge
with accompanying osteophytes without neural impingement. No
foraminal stenosis. No facet arthritis.
IMPRESSION: 1. Severe compression of the thecal sac at L3-4 due to a combination
of a broad-based disc protrusion, epidural hematoma anterior to the
thecal sac likely due to the disc protrusion and hypertrophy of the
ligamentum flavum.
2. Moderately severe spinal stenosis at L4-5 due to a combination of
hypertrophy of the ligamentum flavum and facet joints and a 7 mm
spondylolisthesis.
3. Mild spinal stenosis at L2-3 with slight compression of the left
lateral recess.

## 2019-06-20 MED ORDER — GADOBUTROL 1 MMOL/ML IV SOLN
7.8000 mL | Freq: Once | INTRAVENOUS | Status: AC | PRN
  Administered 2019-06-20: 7.8 mL via INTRAVENOUS

## 2019-06-21 ENCOUNTER — Other Ambulatory Visit: Payer: Self-pay | Admitting: Neurological Surgery

## 2019-06-21 ENCOUNTER — Encounter (HOSPITAL_COMMUNITY): Payer: Self-pay | Admitting: Neurological Surgery

## 2019-06-21 DIAGNOSIS — G959 Disease of spinal cord, unspecified: Secondary | ICD-10-CM | POA: Diagnosis not present

## 2019-06-21 DIAGNOSIS — M5126 Other intervertebral disc displacement, lumbar region: Secondary | ICD-10-CM | POA: Diagnosis not present

## 2019-06-21 NOTE — Progress Notes (Addendum)
Used [place] [employer] [phone] for PAT information. Spoke with patient's wife Sema.  Wife states patient denies shortness of breath, fever, cough or chest pain.  PCP - Dr Janie Morning Cardiologist - n/a  Chest x-ray - n/a EKG - DOS  Stress Test - n/a ECHO - n/a Cardiac Cath - n/a  STOP now taking any Aspirin (unless otherwise instructed by your surgeon), Aleve, Naproxen, Ibuprofen, Motrin, Advil, Goody's, BC's, all herbal medications, fish oil, and all vitamins.   Coronavirus Screening Covid test scheduled 06/22/19. Do you have any of the following symptoms:  Cough yes/no: No Fever (>100.69F)  yes/no: No Runny nose yes/no: No Sore throat yes/no: No Difficulty breathing/shortness of breath  yes/no: No  Have you traveled in the last 14 days and where? yes/no: No  Wife Sema verbalized understanding of instructions that were given via phone.

## 2019-06-22 ENCOUNTER — Other Ambulatory Visit (HOSPITAL_COMMUNITY)
Admission: RE | Admit: 2019-06-22 | Discharge: 2019-06-22 | Disposition: A | Discharge status: Home / Self Care | Payer: Medicare HMO | Source: Ambulatory Visit | Attending: Neurological Surgery | Admitting: Neurological Surgery

## 2019-06-22 DIAGNOSIS — E46 Unspecified protein-calorie malnutrition: Secondary | ICD-10-CM | POA: Diagnosis not present

## 2019-06-22 DIAGNOSIS — R030 Elevated blood-pressure reading, without diagnosis of hypertension: Secondary | ICD-10-CM | POA: Diagnosis not present

## 2019-06-22 DIAGNOSIS — E876 Hypokalemia: Secondary | ICD-10-CM | POA: Diagnosis not present

## 2019-06-22 DIAGNOSIS — D72829 Elevated white blood cell count, unspecified: Secondary | ICD-10-CM | POA: Diagnosis not present

## 2019-06-22 DIAGNOSIS — M50021 Cervical disc disorder at C4-C5 level with myelopathy: Secondary | ICD-10-CM | POA: Diagnosis present

## 2019-06-22 DIAGNOSIS — T380X5A Adverse effect of glucocorticoids and synthetic analogues, initial encounter: Secondary | ICD-10-CM | POA: Diagnosis not present

## 2019-06-22 DIAGNOSIS — M5126 Other intervertebral disc displacement, lumbar region: Secondary | ICD-10-CM | POA: Diagnosis present

## 2019-06-22 DIAGNOSIS — M4316 Spondylolisthesis, lumbar region: Secondary | ICD-10-CM | POA: Diagnosis not present

## 2019-06-22 DIAGNOSIS — D72828 Other elevated white blood cell count: Secondary | ICD-10-CM | POA: Diagnosis not present

## 2019-06-22 DIAGNOSIS — Z833 Family history of diabetes mellitus: Secondary | ICD-10-CM | POA: Diagnosis not present

## 2019-06-22 DIAGNOSIS — Z20822 Contact with and (suspected) exposure to covid-19: Secondary | ICD-10-CM | POA: Diagnosis present

## 2019-06-22 DIAGNOSIS — R2689 Other abnormalities of gait and mobility: Secondary | ICD-10-CM | POA: Diagnosis not present

## 2019-06-22 DIAGNOSIS — Z85038 Personal history of other malignant neoplasm of large intestine: Secondary | ICD-10-CM | POA: Diagnosis not present

## 2019-06-22 DIAGNOSIS — E785 Hyperlipidemia, unspecified: Secondary | ICD-10-CM | POA: Diagnosis not present

## 2019-06-22 DIAGNOSIS — R7303 Prediabetes: Secondary | ICD-10-CM | POA: Diagnosis not present

## 2019-06-22 DIAGNOSIS — G8918 Other acute postprocedural pain: Secondary | ICD-10-CM | POA: Diagnosis not present

## 2019-06-22 DIAGNOSIS — Z79899 Other long term (current) drug therapy: Secondary | ICD-10-CM | POA: Diagnosis not present

## 2019-06-22 DIAGNOSIS — M5416 Radiculopathy, lumbar region: Secondary | ICD-10-CM | POA: Diagnosis not present

## 2019-06-22 DIAGNOSIS — D72823 Leukemoid reaction: Secondary | ICD-10-CM | POA: Diagnosis not present

## 2019-06-22 DIAGNOSIS — M5412 Radiculopathy, cervical region: Secondary | ICD-10-CM | POA: Diagnosis present

## 2019-06-22 DIAGNOSIS — R0989 Other specified symptoms and signs involving the circulatory and respiratory systems: Secondary | ICD-10-CM | POA: Diagnosis not present

## 2019-06-22 DIAGNOSIS — R739 Hyperglycemia, unspecified: Secondary | ICD-10-CM | POA: Diagnosis not present

## 2019-06-22 DIAGNOSIS — Z01812 Encounter for preprocedural laboratory examination: Secondary | ICD-10-CM | POA: Insufficient documentation

## 2019-06-22 DIAGNOSIS — E7800 Pure hypercholesterolemia, unspecified: Secondary | ICD-10-CM | POA: Diagnosis not present

## 2019-06-22 DIAGNOSIS — E8809 Other disorders of plasma-protein metabolism, not elsewhere classified: Secondary | ICD-10-CM | POA: Diagnosis not present

## 2019-06-22 DIAGNOSIS — Z9889 Other specified postprocedural states: Secondary | ICD-10-CM | POA: Diagnosis not present

## 2019-06-22 DIAGNOSIS — M5020 Other cervical disc displacement, unspecified cervical region: Secondary | ICD-10-CM | POA: Diagnosis not present

## 2019-06-22 DIAGNOSIS — M4322 Fusion of spine, cervical region: Secondary | ICD-10-CM | POA: Diagnosis not present

## 2019-06-22 DIAGNOSIS — K5903 Drug induced constipation: Secondary | ICD-10-CM | POA: Diagnosis not present

## 2019-06-22 DIAGNOSIS — R339 Retention of urine, unspecified: Secondary | ICD-10-CM | POA: Diagnosis not present

## 2019-06-22 DIAGNOSIS — Z981 Arthrodesis status: Secondary | ICD-10-CM | POA: Diagnosis not present

## 2019-06-22 DIAGNOSIS — M4802 Spinal stenosis, cervical region: Secondary | ICD-10-CM | POA: Diagnosis not present

## 2019-06-22 DIAGNOSIS — M48061 Spinal stenosis, lumbar region without neurogenic claudication: Secondary | ICD-10-CM | POA: Diagnosis present

## 2019-06-22 LAB — SARS CORONAVIRUS 2 (TAT 6-24 HRS): SARS Coronavirus 2: NEGATIVE

## 2019-06-25 ENCOUNTER — Inpatient Hospital Stay (HOSPITAL_COMMUNITY): Payer: Medicare HMO | Admitting: Certified Registered Nurse Anesthetist

## 2019-06-25 ENCOUNTER — Inpatient Hospital Stay (HOSPITAL_COMMUNITY): Payer: Medicare HMO

## 2019-06-25 ENCOUNTER — Inpatient Hospital Stay (HOSPITAL_COMMUNITY)
Admission: RE | Admit: 2019-06-25 | Discharge: 2019-06-28 | DRG: 472 | Disposition: A | Discharge status: Rehabilitation Facility | Payer: Medicare HMO | Attending: Neurological Surgery | Admitting: Neurological Surgery

## 2019-06-25 ENCOUNTER — Encounter (HOSPITAL_COMMUNITY)
Admission: RE | Disposition: A | Discharge status: Still In Hospital | Payer: Self-pay | Source: Home / Self Care | Attending: Neurological Surgery

## 2019-06-25 ENCOUNTER — Encounter (HOSPITAL_COMMUNITY): Payer: Self-pay | Admitting: Neurological Surgery

## 2019-06-25 ENCOUNTER — Other Ambulatory Visit: Payer: Self-pay

## 2019-06-25 DIAGNOSIS — Z85038 Personal history of other malignant neoplasm of large intestine: Secondary | ICD-10-CM

## 2019-06-25 DIAGNOSIS — R030 Elevated blood-pressure reading, without diagnosis of hypertension: Secondary | ICD-10-CM | POA: Diagnosis not present

## 2019-06-25 DIAGNOSIS — T380X5A Adverse effect of glucocorticoids and synthetic analogues, initial encounter: Secondary | ICD-10-CM | POA: Diagnosis not present

## 2019-06-25 DIAGNOSIS — M5416 Radiculopathy, lumbar region: Secondary | ICD-10-CM | POA: Diagnosis not present

## 2019-06-25 DIAGNOSIS — Z833 Family history of diabetes mellitus: Secondary | ICD-10-CM | POA: Diagnosis not present

## 2019-06-25 DIAGNOSIS — M5126 Other intervertebral disc displacement, lumbar region: Secondary | ICD-10-CM | POA: Diagnosis present

## 2019-06-25 DIAGNOSIS — D72828 Other elevated white blood cell count: Secondary | ICD-10-CM | POA: Diagnosis not present

## 2019-06-25 DIAGNOSIS — Z79899 Other long term (current) drug therapy: Secondary | ICD-10-CM | POA: Diagnosis not present

## 2019-06-25 DIAGNOSIS — G8918 Other acute postprocedural pain: Secondary | ICD-10-CM

## 2019-06-25 DIAGNOSIS — M50021 Cervical disc disorder at C4-C5 level with myelopathy: Secondary | ICD-10-CM | POA: Diagnosis present

## 2019-06-25 DIAGNOSIS — Z9889 Other specified postprocedural states: Secondary | ICD-10-CM | POA: Diagnosis not present

## 2019-06-25 DIAGNOSIS — M48061 Spinal stenosis, lumbar region without neurogenic claudication: Secondary | ICD-10-CM | POA: Diagnosis present

## 2019-06-25 DIAGNOSIS — R739 Hyperglycemia, unspecified: Secondary | ICD-10-CM | POA: Diagnosis not present

## 2019-06-25 DIAGNOSIS — M5020 Other cervical disc displacement, unspecified cervical region: Secondary | ICD-10-CM | POA: Diagnosis not present

## 2019-06-25 DIAGNOSIS — K5903 Drug induced constipation: Secondary | ICD-10-CM | POA: Diagnosis present

## 2019-06-25 DIAGNOSIS — Z981 Arthrodesis status: Secondary | ICD-10-CM

## 2019-06-25 DIAGNOSIS — D72823 Leukemoid reaction: Secondary | ICD-10-CM | POA: Diagnosis not present

## 2019-06-25 DIAGNOSIS — R339 Retention of urine, unspecified: Secondary | ICD-10-CM | POA: Diagnosis present

## 2019-06-25 DIAGNOSIS — E876 Hypokalemia: Secondary | ICD-10-CM | POA: Diagnosis not present

## 2019-06-25 DIAGNOSIS — M4802 Spinal stenosis, cervical region: Secondary | ICD-10-CM | POA: Diagnosis present

## 2019-06-25 DIAGNOSIS — E785 Hyperlipidemia, unspecified: Secondary | ICD-10-CM | POA: Diagnosis present

## 2019-06-25 DIAGNOSIS — M5412 Radiculopathy, cervical region: Secondary | ICD-10-CM | POA: Diagnosis present

## 2019-06-25 DIAGNOSIS — Z20822 Contact with and (suspected) exposure to covid-19: Secondary | ICD-10-CM | POA: Diagnosis present

## 2019-06-25 DIAGNOSIS — R7303 Prediabetes: Secondary | ICD-10-CM | POA: Diagnosis not present

## 2019-06-25 DIAGNOSIS — E8809 Other disorders of plasma-protein metabolism, not elsewhere classified: Secondary | ICD-10-CM | POA: Diagnosis not present

## 2019-06-25 DIAGNOSIS — Z419 Encounter for procedure for purposes other than remedying health state, unspecified: Secondary | ICD-10-CM

## 2019-06-25 DIAGNOSIS — E46 Unspecified protein-calorie malnutrition: Secondary | ICD-10-CM | POA: Diagnosis not present

## 2019-06-25 DIAGNOSIS — R2689 Other abnormalities of gait and mobility: Secondary | ICD-10-CM | POA: Diagnosis not present

## 2019-06-25 DIAGNOSIS — R0989 Other specified symptoms and signs involving the circulatory and respiratory systems: Secondary | ICD-10-CM | POA: Diagnosis not present

## 2019-06-25 HISTORY — DX: Dependence on wheelchair: Z99.3

## 2019-06-25 HISTORY — DX: Hyperlipidemia, unspecified: E78.5

## 2019-06-25 HISTORY — PX: ANTERIOR CERVICAL DECOMP/DISCECTOMY FUSION: SHX1161

## 2019-06-25 HISTORY — PX: LUMBAR LAMINECTOMY/DECOMPRESSION MICRODISCECTOMY: SHX5026

## 2019-06-25 LAB — CBC
HCT: 45.3 % (ref 39.0–52.0)
Hemoglobin: 15.1 g/dL (ref 13.0–17.0)
MCH: 32.4 pg (ref 26.0–34.0)
MCHC: 33.3 g/dL (ref 30.0–36.0)
MCV: 97.2 fL (ref 80.0–100.0)
Platelets: 168 10*3/uL (ref 150–400)
RBC: 4.66 MIL/uL (ref 4.22–5.81)
RDW: 12.7 % (ref 11.5–15.5)
WBC: 8.4 10*3/uL (ref 4.0–10.5)
nRBC: 0 % (ref 0.0–0.2)

## 2019-06-25 LAB — BASIC METABOLIC PANEL
Anion gap: 11 (ref 5–15)
BUN: 9 mg/dL (ref 8–23)
CO2: 25 mmol/L (ref 22–32)
Calcium: 9 mg/dL (ref 8.9–10.3)
Chloride: 104 mmol/L (ref 98–111)
Creatinine, Ser: 0.61 mg/dL (ref 0.61–1.24)
GFR calc Af Amer: >60 mL/min (ref >60)
GFR calc non Af Amer: >60 mL/min (ref >60)
Glucose, Bld: 97 mg/dL (ref 70–99)
Potassium: 3.9 mmol/L (ref 3.5–5.1)
Sodium: 140 mmol/L (ref 135–145)

## 2019-06-25 LAB — SURGICAL PCR SCREEN
MRSA, PCR: NEGATIVE
Staphylococcus aureus: NEGATIVE

## 2019-06-25 IMAGING — RF DG LUMBAR SPINE 2-3V
1 series · 1 of 1 positions shown · non-contrast
Comparison: [DATE]

CLINICAL DATA: Cervical fusion and lumbar laminectomy

EXAM:
DG C-ARM 1-60 MIN; LUMBAR SPINE - 2-3 VIEW

[Series 1: unknown protocol · 0.14mm/px · 1 of 1 slices shown]
[im 1/1]
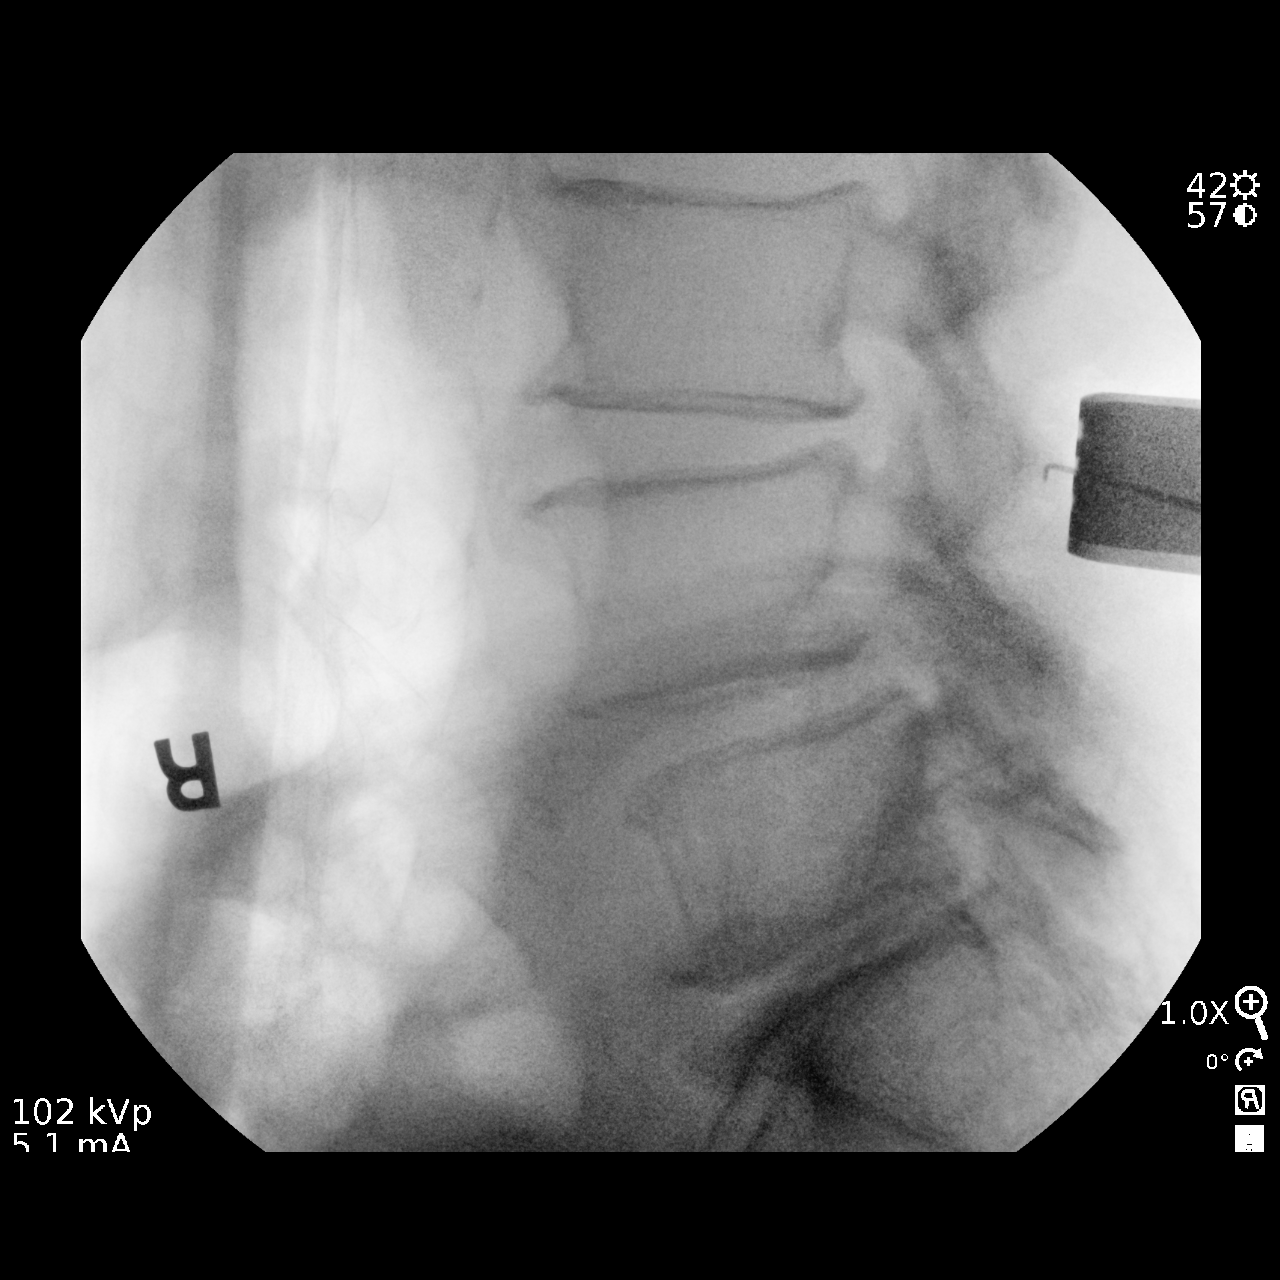

[1 of 1 positions shown; findings below may reference images not displayed]

FLUOROSCOPY TIME:  Fluoroscopy Time:  6 seconds

Radiation Exposure Index (if provided by the fluoroscopic device):
4.15 mGy

Number of Acquired Spot Images: 1
FINDINGS: Mild anterolisthesis is again noted of L4 on L5. surgical
instruments are noted just posterior to the L3-4 interspace.
IMPRESSION: Intraoperative localization at L3-4.

## 2019-06-25 IMAGING — RF DG C-ARM 1-60 MIN
1 series · 1 of 1 positions shown · non-contrast
Comparison: [DATE]

CLINICAL DATA: Cervical fusion and lumbar laminectomy

EXAM:
DG C-ARM 1-60 MIN; LUMBAR SPINE - 2-3 VIEW

[Series 1: unknown protocol · 0.14mm/px · 1 of 1 slices shown]
[im 1/1]
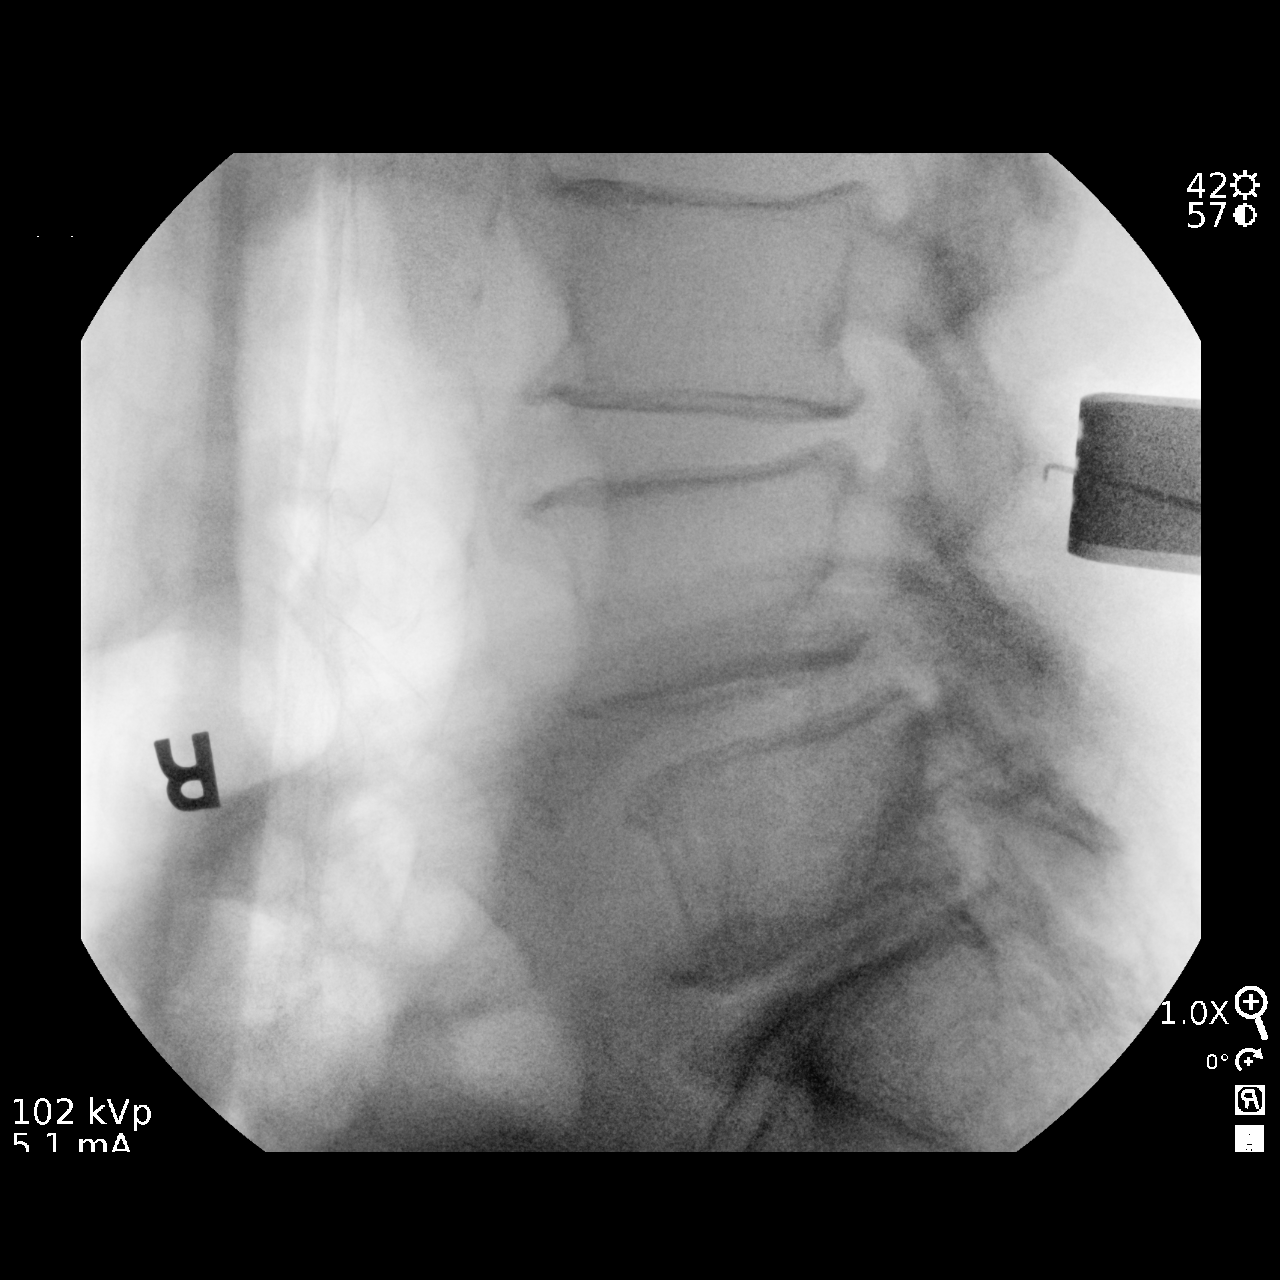

[1 of 1 positions shown; findings below may reference images not displayed]

FLUOROSCOPY TIME:  Fluoroscopy Time:  6 seconds

Radiation Exposure Index (if provided by the fluoroscopic device):
4.15 mGy

Number of Acquired Spot Images: 1
FINDINGS: Mild anterolisthesis is again noted of L4 on L5. surgical
instruments are noted just posterior to the L3-4 interspace.
IMPRESSION: Intraoperative localization at L3-4.

## 2019-06-25 IMAGING — RF DG CERVICAL SPINE 2 OR 3 VIEWS
1 series · 1 of 1 positions shown · non-contrast
Comparison: MRI from [DATE]

CLINICAL DATA: Cervical fusion

EXAM:
CERVICAL SPINE - 2-3 VIEW

[Series 1: unknown protocol · 0.14mm/px · 1 of 1 slices shown]
[im 1/1]
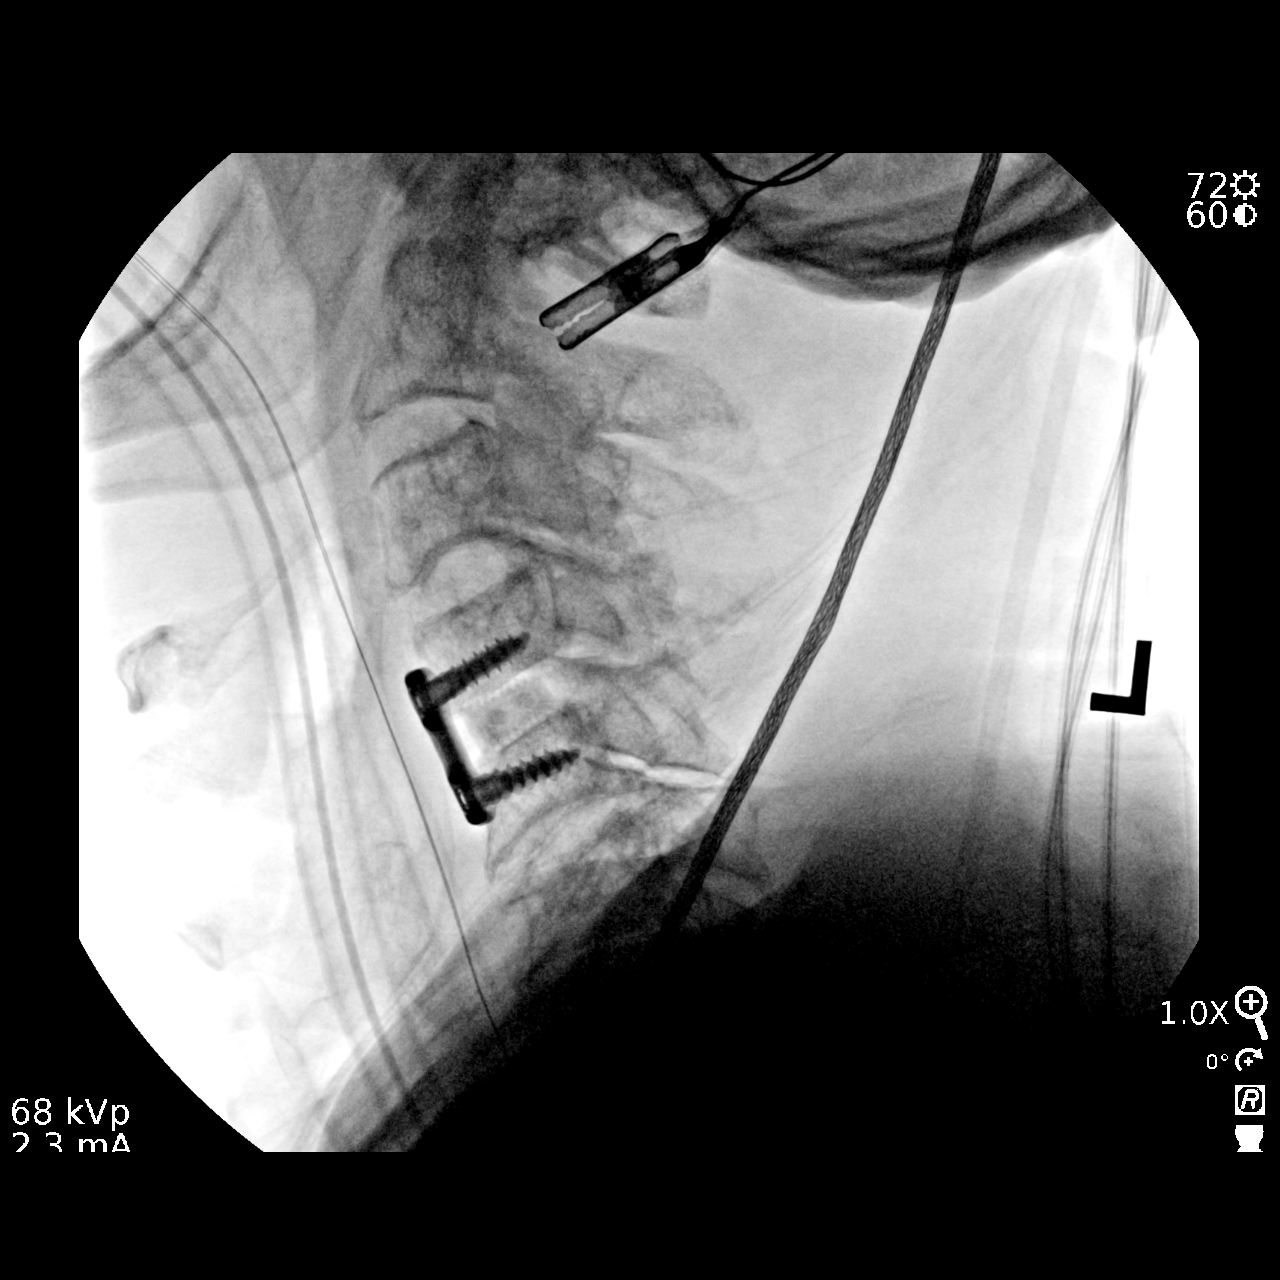

[1 of 1 positions shown; findings below may reference images not displayed]

FLUOROSCOPY TIME:  Radiation Exposure Index (as provided by the
fluoroscopic device): 4.56 mGy

If the device does not provide the exposure index:

Fluoroscopy Time:  12 seconds

Number of Acquired Images:  1
FINDINGS: Postsurgical changes are noted at C4-5 with interbody fusion and
anterior fixation. Numbering nomenclature is similar to that
utilized on prior MRI.
IMPRESSION: C4-5 fusion.

## 2019-06-25 SURGERY — ANTERIOR CERVICAL DECOMPRESSION/DISCECTOMY FUSION 1 LEVEL
Anesthesia: General | Site: Back

## 2019-06-25 MED ORDER — OXYCODONE HCL 5 MG PO TABS
5.0000 mg | ORAL_TABLET | ORAL | Status: DC | PRN
  Administered 2019-06-26: 5 mg via ORAL
  Filled 2019-06-25: qty 1

## 2019-06-25 MED ORDER — PROPOFOL 10 MG/ML IV BOLUS
INTRAVENOUS | Status: AC
  Filled 2019-06-25: qty 20

## 2019-06-25 MED ORDER — ROCURONIUM BROMIDE 10 MG/ML (PF) SYRINGE
PREFILLED_SYRINGE | INTRAVENOUS | Status: DC | PRN
  Administered 2019-06-25 (×2): 10 mg via INTRAVENOUS
  Administered 2019-06-25: 60 mg via INTRAVENOUS
  Administered 2019-06-25: 20 mg via INTRAVENOUS

## 2019-06-25 MED ORDER — ROCURONIUM BROMIDE 10 MG/ML (PF) SYRINGE
PREFILLED_SYRINGE | INTRAVENOUS | Status: AC
  Filled 2019-06-25: qty 60

## 2019-06-25 MED ORDER — THROMBIN 5000 UNITS EX SOLR
CUTANEOUS | Status: AC
  Filled 2019-06-25: qty 5000

## 2019-06-25 MED ORDER — LACTATED RINGERS IV SOLN: INTRAVENOUS | Status: DC

## 2019-06-25 MED ORDER — FENTANYL CITRATE (PF) 250 MCG/5ML IJ SOLN
INTRAMUSCULAR | Status: AC
  Filled 2019-06-25: qty 5

## 2019-06-25 MED ORDER — 0.9 % SODIUM CHLORIDE (POUR BTL) OPTIME
TOPICAL | Status: DC | PRN
  Administered 2019-06-25: 1000 mL

## 2019-06-25 MED ORDER — OXYCODONE HCL 5 MG PO TABS: 5.0000 mg | ORAL_TABLET | Freq: Once | ORAL | Status: DC | PRN

## 2019-06-25 MED ORDER — ACETAMINOPHEN 325 MG PO TABS
650.0000 mg | ORAL_TABLET | ORAL | Status: DC | PRN
  Administered 2019-06-27 (×2): 650 mg via ORAL
  Filled 2019-06-25 (×2): qty 2

## 2019-06-25 MED ORDER — PROPOFOL 10 MG/ML IV BOLUS
INTRAVENOUS | Status: DC | PRN
  Administered 2019-06-25: 150 mg via INTRAVENOUS

## 2019-06-25 MED ORDER — SENNA 8.6 MG PO TABS
1.0000 | ORAL_TABLET | Freq: Two times a day (BID) | ORAL | Status: DC
  Administered 2019-06-26 – 2019-06-28 (×4): 8.6 mg via ORAL
  Filled 2019-06-25 (×6): qty 1

## 2019-06-25 MED ORDER — DEXAMETHASONE SODIUM PHOSPHATE 10 MG/ML IJ SOLN
INTRAMUSCULAR | Status: AC
  Filled 2019-06-25: qty 1

## 2019-06-25 MED ORDER — ORAL CARE MOUTH RINSE: 15.0000 mL | Freq: Once | OROMUCOSAL | Status: AC

## 2019-06-25 MED ORDER — FENTANYL CITRATE (PF) 100 MCG/2ML IJ SOLN: 25.0000 ug | INTRAMUSCULAR | Status: DC | PRN

## 2019-06-25 MED ORDER — BUPIVACAINE HCL (PF) 0.25 % IJ SOLN
INTRAMUSCULAR | Status: DC | PRN
  Administered 2019-06-25: 5 mL

## 2019-06-25 MED ORDER — CEFAZOLIN SODIUM-DEXTROSE 2-4 GM/100ML-% IV SOLN
2.0000 g | Freq: Three times a day (TID) | INTRAVENOUS | Status: AC
  Administered 2019-06-26 (×2): 2 g via INTRAVENOUS
  Filled 2019-06-25 (×2): qty 100

## 2019-06-25 MED ORDER — MIDAZOLAM HCL 2 MG/2ML IJ SOLN
INTRAMUSCULAR | Status: DC | PRN
  Administered 2019-06-25: 2 mg via INTRAVENOUS

## 2019-06-25 MED ORDER — HEMOSTATIC AGENTS (NO CHARGE) OPTIME
TOPICAL | Status: DC | PRN
  Administered 2019-06-25 (×2): 1 via TOPICAL

## 2019-06-25 MED ORDER — BUPIVACAINE HCL (PF) 0.25 % IJ SOLN
INTRAMUSCULAR | Status: AC
  Filled 2019-06-25: qty 30

## 2019-06-25 MED ORDER — SUGAMMADEX SODIUM 200 MG/2ML IV SOLN
INTRAVENOUS | Status: DC | PRN
  Administered 2019-06-25: 200 mg via INTRAVENOUS

## 2019-06-25 MED ORDER — PHENOL 1.4 % MT LIQD: 1.0000 | OROMUCOSAL | Status: DC | PRN

## 2019-06-25 MED ORDER — POTASSIUM CHLORIDE IN NACL 20-0.9 MEQ/L-% IV SOLN
INTRAVENOUS | Status: DC
  Filled 2019-06-25: qty 1000

## 2019-06-25 MED ORDER — FENTANYL CITRATE (PF) 250 MCG/5ML IJ SOLN
INTRAMUSCULAR | Status: DC | PRN
  Administered 2019-06-25: 50 ug via INTRAVENOUS
  Administered 2019-06-25 (×3): 100 ug via INTRAVENOUS
  Administered 2019-06-25: 50 ug via INTRAVENOUS

## 2019-06-25 MED ORDER — ONDANSETRON HCL 4 MG/2ML IJ SOLN: 4.0000 mg | Freq: Four times a day (QID) | INTRAMUSCULAR | Status: DC | PRN

## 2019-06-25 MED ORDER — SODIUM CHLORIDE 0.9 % IV SOLN
250.0000 mL | INTRAVENOUS | Status: DC
  Administered 2019-06-26: 250 mL via INTRAVENOUS

## 2019-06-25 MED ORDER — CHLORHEXIDINE GLUCONATE 0.12 % MT SOLN
15.0000 mL | Freq: Once | OROMUCOSAL | Status: AC
  Administered 2019-06-25: 15 mL via OROMUCOSAL
  Filled 2019-06-25: qty 15

## 2019-06-25 MED ORDER — DEXAMETHASONE SODIUM PHOSPHATE 4 MG/ML IJ SOLN
4.0000 mg | Freq: Four times a day (QID) | INTRAMUSCULAR | Status: DC
  Administered 2019-06-26 (×2): 4 mg via INTRAVENOUS
  Filled 2019-06-25 (×3): qty 1

## 2019-06-25 MED ORDER — THROMBIN 5000 UNITS EX SOLR
CUTANEOUS | Status: DC | PRN
  Administered 2019-06-25: 10000 [IU] via TOPICAL

## 2019-06-25 MED ORDER — MORPHINE SULFATE (PF) 2 MG/ML IV SOLN: 2.0000 mg | INTRAVENOUS | Status: DC | PRN

## 2019-06-25 MED ORDER — MENTHOL 3 MG MT LOZG: 1.0000 | LOZENGE | OROMUCOSAL | Status: DC | PRN

## 2019-06-25 MED ORDER — OXYCODONE HCL 5 MG/5ML PO SOLN: 5.0000 mg | Freq: Once | ORAL | Status: DC | PRN

## 2019-06-25 MED ORDER — DEXAMETHASONE SODIUM PHOSPHATE 10 MG/ML IJ SOLN
INTRAMUSCULAR | Status: DC | PRN
  Administered 2019-06-25: 10 mg via INTRAVENOUS

## 2019-06-25 MED ORDER — VECURONIUM BROMIDE 10 MG IV SOLR
INTRAVENOUS | Status: AC
  Filled 2019-06-25: qty 10

## 2019-06-25 MED ORDER — LIDOCAINE 2% (20 MG/ML) 5 ML SYRINGE
INTRAMUSCULAR | Status: AC
  Filled 2019-06-25: qty 5

## 2019-06-25 MED ORDER — THROMBIN 5000 UNITS EX SOLR
CUTANEOUS | Status: AC
  Filled 2019-06-25: qty 15000

## 2019-06-25 MED ORDER — DEXAMETHASONE 4 MG PO TABS
4.0000 mg | ORAL_TABLET | Freq: Four times a day (QID) | ORAL | Status: DC
  Administered 2019-06-26 – 2019-06-28 (×9): 4 mg via ORAL
  Filled 2019-06-25 (×9): qty 1

## 2019-06-25 MED ORDER — SODIUM CHLORIDE 0.9% FLUSH
3.0000 mL | Freq: Two times a day (BID) | INTRAVENOUS | Status: DC
  Administered 2019-06-26 – 2019-06-28 (×5): 3 mL via INTRAVENOUS

## 2019-06-25 MED ORDER — SODIUM CHLORIDE 0.9% FLUSH: 3.0000 mL | INTRAVENOUS | Status: DC | PRN

## 2019-06-25 MED ORDER — BUPIVACAINE-EPINEPHRINE 0.5% -1:200000 IJ SOLN
INTRAMUSCULAR | Status: AC
  Filled 2019-06-25: qty 1

## 2019-06-25 MED ORDER — ONDANSETRON HCL 4 MG/2ML IJ SOLN
INTRAMUSCULAR | Status: AC
  Filled 2019-06-25: qty 2

## 2019-06-25 MED ORDER — ACETAMINOPHEN 650 MG RE SUPP: 650.0000 mg | RECTAL | Status: DC | PRN

## 2019-06-25 MED ORDER — EPINEPHRINE PF 1 MG/ML IJ SOLN
INTRAMUSCULAR | Status: AC
  Filled 2019-06-25: qty 1

## 2019-06-25 MED ORDER — THROMBIN 5000 UNITS EX SOLR
OROMUCOSAL | Status: DC | PRN
  Administered 2019-06-25 (×2): 5 mL via TOPICAL

## 2019-06-25 MED ORDER — ONDANSETRON HCL 4 MG/2ML IJ SOLN
INTRAMUSCULAR | Status: DC | PRN
  Administered 2019-06-25: 4 mg via INTRAVENOUS

## 2019-06-25 MED ORDER — ONDANSETRON HCL 4 MG PO TABS: 4.0000 mg | ORAL_TABLET | Freq: Four times a day (QID) | ORAL | Status: DC | PRN

## 2019-06-25 MED ORDER — VECURONIUM BROMIDE 10 MG IV SOLR
INTRAVENOUS | Status: DC | PRN
  Administered 2019-06-25 (×2): 2 mg via INTRAVENOUS

## 2019-06-25 MED ORDER — LIDOCAINE 2% (20 MG/ML) 5 ML SYRINGE
INTRAMUSCULAR | Status: DC | PRN
  Administered 2019-06-25: 70 mg via INTRAVENOUS

## 2019-06-25 MED ORDER — CEFAZOLIN SODIUM 1 G IJ SOLR
INTRAMUSCULAR | Status: AC
  Filled 2019-06-25: qty 40

## 2019-06-25 MED ORDER — MIDAZOLAM HCL 2 MG/2ML IJ SOLN
INTRAMUSCULAR | Status: AC
  Filled 2019-06-25: qty 2

## 2019-06-25 MED ORDER — METHOCARBAMOL 500 MG PO TABS
500.0000 mg | ORAL_TABLET | Freq: Four times a day (QID) | ORAL | Status: DC | PRN
  Filled 2019-06-25: qty 1

## 2019-06-25 MED ORDER — SODIUM CHLORIDE 0.9 % IV SOLN
INTRAVENOUS | Status: DC | PRN
  Administered 2019-06-25: 500 mL

## 2019-06-25 MED ORDER — METHOCARBAMOL 1000 MG/10ML IJ SOLN
500.0000 mg | Freq: Four times a day (QID) | INTRAVENOUS | Status: DC | PRN
  Filled 2019-06-25: qty 5

## 2019-06-25 MED ORDER — CEFAZOLIN SODIUM-DEXTROSE 2-3 GM-%(50ML) IV SOLR
INTRAVENOUS | Status: DC | PRN
  Administered 2019-06-25: 2 g via INTRAVENOUS

## 2019-06-25 SURGICAL SUPPLY — 64 items
BAG DECANTER FOR FLEXI CONT (MISCELLANEOUS) ×6 IMPLANT
BAND RUBBER #18 3X1/16 STRL (MISCELLANEOUS) ×12 IMPLANT
BASKET BONE COLLECTION (BASKET) IMPLANT
BENZOIN TINCTURE PRP APPL 2/3 (GAUZE/BANDAGES/DRESSINGS) ×8 IMPLANT
BIT DRILL 14MM (INSTRUMENTS) IMPLANT
BUR CARBIDE MATCH 3.0 (BURR) ×8 IMPLANT
CANISTER SUCT 3000ML PPV (MISCELLANEOUS) ×8 IMPLANT
CARTRIDGE OIL MAESTRO DRILL (MISCELLANEOUS) ×4 IMPLANT
CLOSURE STERI-STRIP 1/2X4 (GAUZE/BANDAGES/DRESSINGS) ×1
CLOSURE WOUND 1/2 X4 (GAUZE/BANDAGES/DRESSINGS) ×2
CLSR STERI-STRIP ANTIMIC 1/2X4 (GAUZE/BANDAGES/DRESSINGS) ×1 IMPLANT
COVER WAND RF STERILE (DRAPES) ×4 IMPLANT
DERMABOND ADVANCED (GAUZE/BANDAGES/DRESSINGS) ×2
DERMABOND ADVANCED .7 DNX12 (GAUZE/BANDAGES/DRESSINGS) IMPLANT
DIFFUSER DRILL AIR PNEUMATIC (MISCELLANEOUS) ×4 IMPLANT
DRAPE C-ARM 42X72 X-RAY (DRAPES) ×8 IMPLANT
DRAPE LAPAROTOMY 100X72 PEDS (DRAPES) ×4 IMPLANT
DRAPE LAPAROTOMY 100X72X124 (DRAPES) ×4 IMPLANT
DRAPE MICROSCOPE LEICA (MISCELLANEOUS) ×6 IMPLANT
DRAPE SURG 17X23 STRL (DRAPES) ×4 IMPLANT
DRILL 14MM (INSTRUMENTS) ×4
DRSG OPSITE POSTOP 3X4 (GAUZE/BANDAGES/DRESSINGS) ×2 IMPLANT
DRSG OPSITE POSTOP 4X8 (GAUZE/BANDAGES/DRESSINGS) ×2 IMPLANT
DURAPREP 26ML APPLICATOR (WOUND CARE) ×4 IMPLANT
DURAPREP 6ML APPLICATOR 50/CS (WOUND CARE) ×4 IMPLANT
ELECT COATED BLADE 2.86 ST (ELECTRODE) ×4 IMPLANT
ELECT REM PT RETURN 9FT ADLT (ELECTROSURGICAL) ×8
ELECTRODE REM PT RTRN 9FT ADLT (ELECTROSURGICAL) ×4 IMPLANT
GAUZE 4X4 16PLY RFD (DISPOSABLE) IMPLANT
GLOVE BIO SURGEON STRL SZ7 (GLOVE) IMPLANT
GLOVE BIO SURGEON STRL SZ8 (GLOVE) ×8 IMPLANT
GLOVE BIOGEL PI IND STRL 7.0 (GLOVE) IMPLANT
GLOVE BIOGEL PI INDICATOR 7.0 (GLOVE)
GOWN STRL REUS W/ TWL LRG LVL3 (GOWN DISPOSABLE) IMPLANT
GOWN STRL REUS W/ TWL XL LVL3 (GOWN DISPOSABLE) ×2 IMPLANT
GOWN STRL REUS W/TWL 2XL LVL3 (GOWN DISPOSABLE) ×4 IMPLANT
GOWN STRL REUS W/TWL LRG LVL3 (GOWN DISPOSABLE)
GOWN STRL REUS W/TWL XL LVL3 (GOWN DISPOSABLE) ×4
HEMOSTAT POWDER KIT SURGIFOAM (HEMOSTASIS) ×6 IMPLANT
KIT BASIN OR (CUSTOM PROCEDURE TRAY) ×8 IMPLANT
KIT TURNOVER KIT B (KITS) ×8 IMPLANT
NDL HYPO 25X1 1.5 SAFETY (NEEDLE) ×4 IMPLANT
NDL SPNL 20GX3.5 QUINCKE YW (NEEDLE) ×2 IMPLANT
NEEDLE HYPO 25X1 1.5 SAFETY (NEEDLE) ×8 IMPLANT
NEEDLE SPNL 20GX3.5 QUINCKE YW (NEEDLE) ×4 IMPLANT
NS IRRIG 1000ML POUR BTL (IV SOLUTION) ×6 IMPLANT
OIL CARTRIDGE MAESTRO DRILL (MISCELLANEOUS)
PACK LAMINECTOMY NEURO (CUSTOM PROCEDURE TRAY) ×8 IMPLANT
PAD ARMBOARD 7.5X6 YLW CONV (MISCELLANEOUS) ×16 IMPLANT
PIN DISTRACTION 14MM (PIN) ×4 IMPLANT
PLATE 14MM (Plate) ×2 IMPLANT
SCREW CANN 4X16 SS S/DRILL (Screw) ×8 IMPLANT
SPACER ASSEM CERV LORD 7M (Spacer) ×2 IMPLANT
SPONGE INTESTINAL PEANUT (DISPOSABLE) ×4 IMPLANT
SPONGE SURGIFOAM ABS GEL SZ50 (HEMOSTASIS) ×6 IMPLANT
STRIP CLOSURE SKIN 1/2X4 (GAUZE/BANDAGES/DRESSINGS) ×6 IMPLANT
SUT VIC AB 0 CT1 18XCR BRD8 (SUTURE) ×2 IMPLANT
SUT VIC AB 0 CT1 8-18 (SUTURE) ×4
SUT VIC AB 2-0 CP2 18 (SUTURE) ×4 IMPLANT
SUT VIC AB 3-0 SH 8-18 (SUTURE) ×8 IMPLANT
SUT VICRYL 4-0 PS2 18IN ABS (SUTURE) IMPLANT
TOWEL GREEN STERILE (TOWEL DISPOSABLE) ×8 IMPLANT
TOWEL GREEN STERILE FF (TOWEL DISPOSABLE) ×8 IMPLANT
WATER STERILE IRR 1000ML POUR (IV SOLUTION) ×6 IMPLANT

## 2019-06-25 NOTE — Progress Notes (Signed)
Wife and daughter at the bedside.

## 2019-06-25 NOTE — Progress Notes (Signed)
Anesthesia MD notified that we were unable to pass the catheter to relieve his bladder.

## 2019-06-25 NOTE — Progress Notes (Signed)
Surgeon Dr. Ronnald Ramp notified of the situation that the patient needs to urinate and we are unable to I & O cath him.  MD wants a coude catheter and urojet at bedside.  Also mentioned to surgeon that pt has what looks to be a hematoma to have formed at the incision on his lower back.  Will continue to monitor and notify for further changes.

## 2019-06-25 NOTE — Anesthesia Procedure Notes (Signed)
Procedure Name: Intubation Date/Time: 06/25/2019 4:33 PM Performed by: Moshe Salisbury, CRNA Pre-anesthesia Checklist: Patient identified, Emergency Drugs available, Suction available and Patient being monitored Patient Re-evaluated:Patient Re-evaluated prior to induction Oxygen Delivery Method: Circle System Utilized Preoxygenation: Pre-oxygenation with 100% oxygen Induction Type: IV induction Ventilation: Mask ventilation without difficulty Laryngoscope Size: Glidescope and 4 Tube type: Oral Number of attempts: 1 Airway Equipment and Method: Rigid stylet and Video-laryngoscopy Placement Confirmation: ETT inserted through vocal cords under direct vision,  positive ETCO2 and breath sounds checked- equal and bilateral Secured at: 20 cm Tube secured with: Tape Dental Injury: Teeth and Oropharynx as per pre-operative assessment

## 2019-06-25 NOTE — Progress Notes (Signed)
MD at bedside to place catheter.

## 2019-06-25 NOTE — Progress Notes (Signed)
Pt very uncomfortable.  We have had him to the side of the bed to pee and was only able to urinate 100.  Pt bladder scanned and 525 found to be in bladder.  Pt was placed in a wheel chair to go to the bathroom.  At that time the patient was only able to produce drops of urine.

## 2019-06-25 NOTE — Op Note (Signed)
06/25/2019  8:09 PM  PATIENT:  Jodell Cipro  74 y.o. male  PRE-OPERATIVE DIAGNOSIS:  1.  Severe cervical spinal stenosis C4-5, 2.  Severe lumbar spinal stenosis L3-4 with disc herniation, 3.  Back and hip pain, 4.  Gait disturbance  POST-OPERATIVE DIAGNOSIS:  same  PROCEDURE:  1. Decompressive anterior cervical discectomy C4-5, 2. Anterior cervical arthrodesis C4-5 utilizing a corticocancellus allograft bone graft, 3. Anterior cervical plating C4-5 utilizing a Alphatec plate  4.  Decompressive lumbar laminectomy, medial facetectomy foraminotomies L3-4 bilaterally with discectomy  SURGEON:  Sherley Bounds, MD  ASSISTANTS: Glenford Peers, FNP  ANESTHESIA:   General  EBL: Less than 100 ml  Total I/O In: 400 [I.V.:400] Out: -   BLOOD ADMINISTERED: none  DRAINS: none  SPECIMEN:  none  INDICATION FOR PROCEDURE: This patient presented with gait disturbance and difficulty with walking with some back pain and hip pain and numbness in his arms. Imaging showed severe cervical spinal stenosis C4-5 with cord compression.  It had a previous decompressive cervical laminectomy with resection of spinal cord tumor and had gait disturbance following that surgery, but was unable to walk over the last few weeks.  MRI of the lumbar spine showed a large disc herniation at L3-4 with severe spinal stenosis at this level.  Felt this was the more likely reason for the 8 disturbance, but felt uncomfortable putting him to sleep and positioning prone for lumbar surgery without first decompressing the cervical spinal cord at C4-5.  The patient tried conservative measures without relief. Pain was debilitating.  Gait disturbance with severe recommended ACDF with plating at C4-5 followed by decompressive laminectomy and discectomy L3-4. Patient understood the risks, benefits, and alternatives and potential outcomes and wished to proceed.  PROCEDURE DETAILS: Patient was brought to the operating room placed under  general endotracheal anesthesia. Patient was placed in the supine position on the operating room table. The neck was prepped with Duraprep and draped in a sterile fashion.   Three cc of local anesthesia was injected and a transverse incision was made on the right side of the neck.  Dissection was carried down thru the subcutaneous tissue and the platysma was  elevated, opened, and undermined with Metzenbaum scissors.  Dissection was then carried out thru an avascular plane leaving the sternocleidomastoid carotid artery and jugular vein laterally and the trachea and esophagus medially with the assistance of my nurse practitioner. The ventral aspect of the vertebral column was identified and a localizing x-ray was taken. The C4-5 level was identified and all in the room agreed with the level. The longus colli muscles were then elevated and the retractor was placed with the assistance of my nurse practitioner. The annulus was incised and the disc space entered. Discectomy was performed with micro-curettes and pituitary rongeurs. I then used the high-speed drill to drill the endplates down to the level of the posterior longitudinal ligament. The operating microscope was draped and brought into the field provided additional magnification, illumination and visualization. Discectomy was continued posteriorly thru the disc space. Posterior longitudinal ligament was opened with a nerve hook, and then removed along with disc herniation and osteophytes, decompressing the spinal canal and thecal sac. We then continued to remove osteophytic overgrowth and disc material decompressing the neural foramina and exiting nerve roots bilaterally. The scope was angled up and down to help decompress and undercut the vertebral bodies. Once the decompression was completed we could pass a nerve hook circumferentially to assure adequate decompression in the midline and in  the neural foramina. So by both visualization and palpation we felt we  had an adequate decompression of the neural elements. We then measured the height of the intravertebral disc space and selected a 7 mm cortical cancellus allograft bone. It was then gently positioned in the intravertebral disc space(s) and countersunk. I then used a 14 mm Alphatec plate and placed variable angle screws into the vertebral bodies of each level and locked them into position. The wound was irrigated with bacitracin solution, checked for hemostasis which was established and confirmed. Once meticulous hemostasis was achieved, we then proceeded with closure with the assistance of my nurse practitioner. The platysma was closed with interrupted 3-0 undyed Vicryl suture, the subcuticular layer was closed with interrupted 3-0 undyed Vicryl suture. The skin edges were approximated with steristrips. The drapes were removed. A sterile dressing was applied.  At the end of the procedure all sponge, needle and instrument counts were correct.   the patient was rolled into the prone position on the Wilson frame and all pressure points were padded. The lumbar region was cleaned and then prepped with DuraPrep and draped in the usual sterile fashion. 5 cc of local anesthesia was injected and then a dorsal midline incision was made and carried down to the lumbo sacral fascia. The fascia was opened and the paraspinous musculature was taken down in a subperiosteal fashion to expose L3-4 bilaterally. Intraoperative anoscopy confirmed my level, and then I do the spinous process of L3 and used a combination of the high-speed drill and the Kerrison punches to perform a laminectomy, medial facetectomy, and foraminotomy at L3-4 bilaterally. The underlying yellow ligament was opened and removed in a piecemeal fashion to expose the underlying dura and exiting nerve root on each side. I undercut the lateral recess and dissected down until I was medial to and distal to the pedicle. The nerve root was well decompressed. We then  gently retracted the nerve root medially with suction on the left, coagulated the epidural venous vasculature, and found multiple free fragments in the midline paracentral to the left just below the L3-4 disc space.  I removed multiple fragments with a coronary dilator.  At least 10 fragments were removed.  A performed a thorough intradiscal discectomy with pituitary rongeurs and curettes, until I had a nice decompression of the nerve root and the midline. I then palpated with a coronary dilator along the nerve root and into the foramen to assure adequate decompression. I felt no more compression of the nerve root. I irrigated with saline solution containing bacitracin. Achieved hemostasis with bipolar cautery, lined the dura with Gelfoam, and then closed the fascia with 0 Vicryl. I closed the subcutaneous tissues with 2-0 Vicryl and the subcuticular tissues with 3-0 Vicryl. The skin was then closed with benzoin and Steri-Strips. The drapes were removed, a sterile dressing was applied.  My nurse practitioner was involved in the exposure, safe retraction of the neural elements, the disc work and the closure. the patient was awakened from general anesthesia and transferred to the recovery room in stable condition. At the end of the procedure all sponge, needle and instrument counts were correct and the wound was wanded and we checked the wound with AP fluoroscopy to make sure there were no retained sponges.Marland Kitchen    PLAN OF CARE: Admit to inpatient   PATIENT DISPOSITION:  PACU - hemodynamically stable.   Delay start of Pharmacological VTE agent (>24hrs) due to surgical blood loss or risk of bleeding:  yes

## 2019-06-25 NOTE — Progress Notes (Signed)
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 [place],  [place] Id [phone].

## 2019-06-25 NOTE — Anesthesia Preprocedure Evaluation (Signed)
Anesthesia Evaluation  Patient identified by MRN, date of birth, ID band Patient awake    Reviewed: Allergy & Precautions, H&P , NPO status , Patient's Chart, lab work & pertinent test results  Airway Mallampati: II   Neck ROM: full    Dental   Pulmonary neg pulmonary ROS,    breath sounds clear to auscultation       Cardiovascular negative cardio ROS   Rhythm:regular Rate:Normal     Neuro/Psych    GI/Hepatic H/o colon CA   Endo/Other  Hypothyroidism   Renal/GU      Musculoskeletal   Abdominal   Peds  Hematology   Anesthesia Other Findings   Reproductive/Obstetrics                             Anesthesia Physical Anesthesia Plan  ASA: II  Anesthesia Plan: General   Post-op Pain Management:    Induction: Intravenous  PONV Risk Score and Plan: 2 and Ondansetron, Dexamethasone, Midazolam and Treatment may vary due to age or medical condition  Airway Management Planned: Oral ETT  Additional Equipment:   Intra-op Plan:   Post-operative Plan: Extubation in OR  Informed Consent: I have reviewed the patients History and Physical, chart, labs and discussed the procedure including the risks, benefits and alternatives for the proposed anesthesia with the patient or authorized representative who has indicated his/her understanding and acceptance.       Plan Discussed with: CRNA, Anesthesiologist and Surgeon  Anesthesia Plan Comments:         Anesthesia Quick Evaluation

## 2019-06-25 NOTE — Transfer of Care (Signed)
Immediate Anesthesia Transfer of Care Note  Patient: Alan Riley  Procedure(s) Performed: C4-5 ACDF (N/A ) L3-4 LUMBAR LAMINECTOMY/DECOMPRESSION MICRODISCECTOMY (Bilateral Back)  Patient Location: PACU  Anesthesia Type:General  Level of Consciousness: awake and alert   Airway & Oxygen Therapy: Patient Spontanous Breathing and Patient connected to face mask oxygen  Post-op Assessment: Report given to RN and Post -op Vital signs reviewed and stable  Post vital signs: Reviewed and stable  Last Vitals:  Vitals Value Taken Time  BP 162/101 06/25/19 2018  Temp    Pulse 94 06/25/19 2022  Resp 26 06/25/19 2022  SpO2 98 % 06/25/19 2022  Vitals shown include unvalidated device data.  Last Pain:  Vitals:   06/25/19 1305  PainSc: 3       Patients Stated Pain Goal: 3 ([id number])  Complications: No apparent anesthesia complications

## 2019-06-25 NOTE — Progress Notes (Signed)
Catheter placed and pt is much more comfortable.  MD has looked at the lower back incision and has placed no new orders at this time.  Will continue to monitor.  MD called pts family to give them an update.

## 2019-06-25 NOTE — Progress Notes (Signed)
Daughter updated via telephone call of patients room assignment.

## 2019-06-25 NOTE — Progress Notes (Signed)
Anesthesia MD notified that the patient has a significant volume of urine in his bladder but is unable to urinate.  OK for in and out cath.

## 2019-06-25 NOTE — Progress Notes (Signed)
MD called back to let him know the urology cart is at the bedside.  Pt is extremely uncomfortable and fidgety.

## 2019-06-25 NOTE — H&P (Signed)
Subjective:   Patient is a 73 y.o. male admitted for gait disturbance. The patient first presented to me with complaints of neck pain, numbness of the arm(s) and back pain and weakness in the legs. Onset of symptoms was several weeks ago. The pain is described as aching and occurs all day. The pain is rated moderate, and is located in the neck and radiates to the arms and back and legs. The symptoms have been progressive. Symptoms are exacerbated by extending head backwards, and are relieved by none.  Previous work up includes MRI of cervical spine, results: spinal stenosis, MRI Lspine stenosis L3-4  Past Medical History:  Diagnosis Date  . Basal cell carcinoma of skin 2019   scalp  . HLD (hyperlipidemia)   . Hx of adenomatous colonic polyps   . Iron deficiency anemia secondary to blood loss (chronic)   . Malignant neoplasm of ascending colon ([place]) 2004  . Partial small bowel obstruction ([place]) 12/29/2013  . Uses wheelchair     Past Surgical History:  Procedure Laterality Date  . BACK SURGERY     over 30 yrs ago   . CHOLECYSTECTOMY  1991  . COLONOSCOPY  2013, 2016   x 2  . HEMICOLECTOMY  01/15/03    No Known Allergies  Social History   Tobacco Use  . Smoking status: Never Smoker  . Smokeless tobacco: Never Used  Substance Use Topics  . Alcohol use: Not Currently    Comment: occassional    Family History  Problem Relation Age of Onset  . Diabetes Mother   . Diabetes Brother    Prior to Admission medications   Medication Sig Start Date End Date Taking? Authorizing Provider  acetaminophen (TYLENOL) 500 MG tablet Take 500-1,000 mg by mouth every 6 (six) hours as needed for mild pain or moderate pain.   Yes [provider]  atorvastatin (LIPITOR) 20 MG tablet Take 20 mg by mouth at bedtime.    Yes [provider]  Cholecalciferol (VITAMIN D PO) Take 25 mcg by mouth daily.    Yes [provider]  acetaminophen (TYLENOL) 325 MG tablet Take 2 tablets  (650 mg total) by mouth every 6 (six) hours as needed. Patient not taking: Reported on 06/21/2019 04/25/12   Erby Pian, NP     Review of Systems  Positive ROS: neg  All other systems have been reviewed and were otherwise negative with the exception of those mentioned in the HPI and as above.  Objective: Vital signs in last 24 hours: Temp:  [98.2 F (36.8 C)] 98.2 F (36.8 C) (06/01 1208) Pulse Rate:  [64] 64 (06/01 1208) Resp:  [18] 18 (06/01 1208) BP: (137)/(68) 137/68 (06/01 1208) SpO2:  [95 %] 95 % (06/01 1208) Weight:  [78 kg] 78 kg (06/01 1208)  General Appearance: Alert, cooperative, no distress, appears stated age Head: Normocephalic, without obvious abnormality, atraumatic Eyes: PERRL, conjunctiva/corneas clear, EOM's intact      Neck: Supple, symmetrical, trachea midline, Back: Symmetric, no curvature, ROM normal, no CVA tenderness Lungs:  respirations unlabored Heart: Regular rate and rhythm Abdomen: Soft, non-tender Extremities: Extremities normal, atraumatic, no cyanosis or edema Pulses: 2+ and symmetric all extremities Skin: Skin color, texture, turgor normal, no rashes or lesions  NEUROLOGIC:  Mental status: Alert and oriented x4, no aphasia, good attention span, fund of knowledge and memory  Motor Exam - grossly normal Sensory Exam - grossly normal Reflexes: 3+ Coordination - grossly normal Gait - not tested Balance - poor Cranial  Nerves: I: smell Not tested  II: visual acuity  OS: nl    OD: nl  II: visual fields Full to confrontation  II: pupils Equal, round, reactive to light  III,VII: ptosis None  III,IV,VI: extraocular muscles  Full ROM  V: mastication Normal  V: facial light touch sensation  Normal  V,VII: corneal reflex  Present  VII: facial muscle function - upper  Normal  VII: facial muscle function - lower Normal  VIII: hearing Not tested  IX: soft palate elevation  Normal  IX,X: gag reflex Present  XI: trapezius strength  5/5  XI:  sternocleidomastoid strength 5/5  XI: neck flexion strength  5/5  XII: tongue strength  Normal    Data Review Lab Results  Component Value Date   WBC 8.4 06/25/2019   HGB 15.1 06/25/2019   HCT 45.3 06/25/2019   MCV 97.2 06/25/2019   PLT 168 06/25/2019   Lab Results  Component Value Date   NA 140 06/25/2019   K 3.9 06/25/2019   CL 104 06/25/2019   CO2 25 06/25/2019   BUN 9 06/25/2019   CREATININE 0.61 06/25/2019   GLUCOSE 97 06/25/2019   No results found for: INR, PROTIME  Assessment:   Cervical neck pain with herniated nucleus pulposus/ spondylosis/ stenosis at C4-5 and lumbar L3-4. Estimated body mass index is 26.15 kg/m as calculated from the following:   Height as of this encounter: 5\' 8"  (1.727 m).   Weight as of this encounter: 78 kg.  Patient has failed conservative therapy. Planned surgery : 1. ACDF C4-5, @. LL L3-4 with diskectomy  Plan:   I explained the condition and procedure to the patient and answered any questions.  Patient wishes to proceed with procedure as planned. Understands risks/ benefits/ and expected or typical outcomes.  [name] 06/25/2019 3:56 PM

## 2019-06-26 ENCOUNTER — Encounter: Payer: Self-pay | Admitting: *Deleted

## 2019-06-26 MED ORDER — TAMSULOSIN HCL 0.4 MG PO CAPS
0.4000 mg | ORAL_CAPSULE | Freq: Every day | ORAL | Status: DC
  Administered 2019-06-26 – 2019-06-28 (×3): 0.4 mg via ORAL
  Filled 2019-06-26 (×3): qty 1

## 2019-06-26 MED ORDER — CHLORHEXIDINE GLUCONATE CLOTH 2 % EX PADS
6.0000 | MEDICATED_PAD | Freq: Every day | CUTANEOUS | Status: DC
  Administered 2019-06-26 – 2019-06-28 (×3): 6 via TOPICAL

## 2019-06-26 NOTE — Progress Notes (Signed)
Pt arrived to 4NP03 with bag of belongings and wheelchair. Audio translator utilized to orient pt to room and complete initial assessment. A&O x4, pain well-controlled and VSS except BP elevated. Will continue to monitor closely.

## 2019-06-26 NOTE — Evaluation (Addendum)
Occupational Therapy Evaluation Patient Details Name: Alan Riley MRN: [id number] DOB: [birth year] Today's Date: 06/26/2019    History of Present Illness 74 y.o. male admitted for gait disturbance. The patient first presented to me with complaints of neck pain, numbness of the arm(s) and back pain and weakness in the legs. Onset of symptoms was several weeks ago. Imaging with findings of HNP/spondylosis/stenosis at C4-5 and L3-4. Pt underwent decompressive anterior cervical discectomy C4-5 with arthrodesis, and L3-4 decompressive lumbar laminectomy with medial facetectomy foraminotomies bilaterally with discectomy.   Clinical Impression   PTA, pt was living at home with his wife, up until 3 weeks ago pt was independent with ADL/IADL and functional mobility. In the last 3 weeks, pt reports he was having difficulty with mobility and was unable to walk up the stairs. Pt currently requires modA for LB dressing and functional mobility at RW level. Pt required max cues for proper use of RW and min cues for adherence to cervical precautions. Pt reports numbness in right hand, but reports it has improved since the surgery. Due to decline in current level of function, pt would benefit from acute OT to address established goals to facilitate safe D/C to venue listed below. At this time, recommend CIR follow-up. Will continue to follow acutely.  Bosnian interpreter utilized throughout the session: Bilijana [phone]    Follow Up Recommendations  CIR;Supervision/Assistance - 24 hour    Equipment Recommendations  3 in 1 bedside commode    Recommendations for Other Services       Precautions / Restrictions Precautions Precautions: Fall;Cervical;Back Precaution Booklet Issued: No Precaution Comments: PT verbally reviews precautions with use of Bosnian Interpreter  Required Braces or Orthoses: (no brace orders) Restrictions Weight Bearing Restrictions: No      Mobility Bed Mobility Overal bed  mobility: Needs Assistance Bed Mobility: Rolling;Sidelying to Sit Rolling: Min assist Sidelying to sit: Min assist       General bed mobility comments: minA for proper technique and assistance to progress trunk to upright position  Transfers Overall transfer level: Needs assistance Equipment used: Rolling walker (2 wheeled) Transfers: Sit to/from [facility] Sit to Stand: Min assist Stand pivot transfers: Mod assist       General transfer comment: modA for stability and pivot to recliner;max cues for safe use of RW    Balance Overall balance assessment: Needs assistance Sitting-balance support: No upper extremity supported;Feet supported Sitting balance-Leahy Scale: Good Sitting balance - Comments: minguard at times for precautions   Standing balance support: Bilateral upper extremity supported Standing balance-Leahy Scale: Poor Standing balance comment: minA with static standing, noted instability in BLE due to weakness                           ADL either performed or assessed with clinical judgement   ADL Overall ADL's : Needs assistance/impaired Eating/Feeding: Set up;Sitting   Grooming: Set up;Sitting   Upper Body Bathing: Min guard;Sitting   Lower Body Bathing: Moderate assistance;Sit to/from stand   Upper Body Dressing : Min guard;Sitting   Lower Body Dressing: Moderate assistance;Sit to/from stand   Toilet Transfer: Moderate assistance;Stand-pivot;RW Toilet Transfer Details (indicate cue type and reason): simulated to recliner;max cues for safe use of RW Toileting- Clothing Manipulation and Hygiene: Moderate assistance;Sit to/from stand Toileting - Clothing Manipulation Details (indicate cue type and reason): from lower surface     Functional mobility during ADLs: Moderate assistance;Rolling walker General ADL Comments: pt limited by weakness, decreased  activity tolerance, and numbness in RUE     Vision         Perception      Praxis      Pertinent Vitals/Pain Pain Assessment: 0-10 Pain Score: 2  Pain Location: lower back incision site Pain Descriptors / Indicators: Sore Pain Intervention(s): Limited activity within patient's tolerance;Monitored during session     Hand Dominance Right   Extremity/Trunk Assessment Upper Extremity Assessment Upper Extremity Assessment: RUE deficits/detail RUE Deficits / Details: RUE grossly 4-/5;pt reports some numbness in hand, reports it is improved following surgery RUE Coordination: decreased fine motor(may be chronic 2/2 arthritis) LUE Deficits / Details: Grossly 4/5   Lower Extremity Assessment Lower Extremity Assessment: Defer to PT evaluation RLE Deficits / Details: grossly 4-/5 other than 2-/5 L DF RLE Sensation: decreased light touch RLE Coordination: decreased gross motor   Cervical / Trunk Assessment Cervical / Trunk Assessment: Normal   Communication Communication Communication: Prefers language other than English(bosnian)   Cognition Arousal/Alertness: Awake/alert Behavior During Therapy: WFL for tasks assessed/performed Overall Cognitive Status: Within Functional Limits for tasks assessed                                     General Comments  vss    Exercises     Shoulder Instructions      Home Living Family/patient expects to be discharged to:: Private residence Living Arrangements: Spouse/significant other Available Help at Discharge: Family;Available 24 hours/day Type of Home: House([place] Home Access: Stairs to enter Entrance Stairs-Number of Steps: 2 Entrance Stairs-Rails: None Home Layout: Two level Alternate Level Stairs-Number of Steps: 10 Alternate Level Stairs-Rails: Right;Left Bathroom Shower/Tub: Teacher, early years/pre: Standard     Home Equipment: Environmental consultant - 4 wheels;Wheelchair - manual          Prior Functioning/Environment Level of Independence: Independent;Needs assistance  Gait  / Transfers Assistance Needed: In the last 3 weeks, pt reports he was having difficulty with mobility and was unable to walk up the stairs. ADL's / Homemaking Assistance Needed: up until 3 weeks ago pt was independent with ADL/IADL and functional mobility   Comments: pt working polishing silverwear        OT Problem List: Decreased strength;Decreased activity tolerance;Impaired balance (sitting and/or standing);Decreased safety awareness;Decreased knowledge of precautions;Pain      OT Treatment/Interventions: Therapeutic exercise;Self-care/ADL training;Energy conservation;DME and/or AE instruction;Therapeutic activities;Patient/family education;Balance training    OT Goals(Current goals can be found in the care plan section) Acute Rehab OT Goals Patient Stated Goal: To return to independent mobility OT Goal Formulation: With patient Time For Goal Achievement: 07/10/19 Potential to Achieve Goals: Good ADL Goals Pt Will Perform Grooming: with supervision;standing Pt Will Perform Upper Body Dressing: with supervision;sitting Pt Will Perform Lower Body Dressing: with supervision;sit to/from stand;with adaptive equipment Pt Will Transfer to Toilet: with supervision;ambulating Additional ADL Goal #1: Pt will demonstrate independence with adherence to 3/3 cervical precautions during ADL/IADL and functional mobility.  OT Frequency: Min 2X/week   Barriers to D/C: Decreased caregiver support          Co-evaluation              AM-PAC OT "6 Clicks" Daily Activity     Outcome Measure Help from another person eating meals?: A Little Help from another person taking care of personal grooming?: A Little Help from another person toileting, which includes using toliet, bedpan, or urinal?: A  Lot Help from another person bathing (including washing, rinsing, drying)?: A Lot Help from another person to put on and taking off regular upper body clothing?: A Little Help from another person to put  on and taking off regular lower body clothing?: A Lot 6 Click Score: 15   End of Session Equipment Utilized During Treatment: Gait belt;Rolling walker Nurse Communication: Mobility status  Activity Tolerance: Patient tolerated treatment well Patient left: in chair;with call bell/phone within reach;with chair alarm set  OT Visit Diagnosis: Unsteadiness on feet (R26.81);Other abnormalities of gait and mobility (R26.89);Muscle weakness (generalized) (M62.81);Pain Pain - part of body: (lower back)                Time: [id number] OT Time Calculation (min): 36 min Charges:  OT General Charges $OT Visit: 1 Visit OT Evaluation $OT Eval Moderate Complexity: 1 Mod OT Treatments $Self Care/Home Management : 8-22 mins  Helene Kelp OTR/L Acute Rehabilitation Services Office: [phone]   Wyn Forster 06/26/2019, 4:19 PM

## 2019-06-26 NOTE — Progress Notes (Signed)
The patient's daughter, Meriel Pica, would like a phone call update from a MD. Her mobile number is [phone].

## 2019-06-26 NOTE — Progress Notes (Signed)
Bosnian interpreter utilized from interpreter services to perform assessment and ask patient if there is anything he wanted to communicate with his RN/NT.

## 2019-06-26 NOTE — Evaluation (Signed)
Physical Therapy Evaluation Patient Details Name: Alan Riley MRN: [id number] DOB: 1945/06/18 Today's Date: 06/26/2019   History of Present Illness  74 y.o. male admitted for gait disturbance. The patient first presented to me with complaints of neck pain, numbness of the arm(s) and back pain and weakness in the legs. Onset of symptoms was several weeks ago. Imaging with findings of HNP/spondylosis/stenosis at C4-5 and L3-4. Pt underwent decompressive anterior cervical discectomy C4-5 with arthrodesis, and L3-4 decompressive lumbar laminectomy with medial facetectomy foraminotomies bilaterally with discectomy.  Clinical Impression  Pt presents to PT with deficits in functional mobility, gait, balance, strength, power, endurance, coordination, and sensation. Pt currently with R sided weakness that is moire significant than left, also demonstrating impaired sensation and coordination with R sided movement. Pt remains at a high falls risk due to impaired functional mobility and gait, needing physical assistance for all mobility to prevent falls. 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 PT currently recommending CIR at time of discharge to progress mobility and reduce falls risk.    Follow Up Recommendations CIR;Supervision/Assistance - 24 hour    Equipment Recommendations  Rolling walker with 5" wheels;3in1 (PT)    Recommendations for Other Services Rehab consult     Precautions / Restrictions Precautions Precautions: Fall;Cervical;Back Precaution Booklet Issued: No Precaution Comments: PT verbally reviews precautions with use of Bosnian Interpreter  Required Braces or Orthoses: (no brace orders) Restrictions Weight Bearing Restrictions: No      Mobility  Bed Mobility Overal bed mobility: Needs Assistance Bed Mobility:  Rolling;Sidelying to Sit Rolling: Min assist Sidelying to sit: Min assist       General bed mobility comments: PT provides cues for log roll  Transfers Overall transfer level: Needs assistance Equipment used: Rolling walker (2 wheeled) Transfers: Sit to/from [facility] Sit to Stand: Min assist Stand pivot transfers: Mod assist       General transfer comment: modA for steps to recliner, weakness and impaired coordination of RLE  Ambulation/Gait Ambulation/Gait assistance: Mod assist Gait Distance (Feet): 3 Feet Assistive device: Rolling walker (2 wheeled) Gait Pattern/deviations: Step-to pattern Gait velocity: reduced Gait velocity interpretation: <1.8 ft/sec, indicate of risk for recurrent falls General Gait Details: pt with short shuffling steps, impaired coordination with advancement of RLE, PT providing tactile cues to facilitate transfer  Stairs            Wheelchair Mobility    Modified Rankin (Stroke Patients Only)       Balance Overall balance assessment: Needs assistance Sitting-balance support: No upper extremity supported;Feet supported Sitting balance-Leahy Scale: Good Sitting balance - Comments: supervision   Standing balance support: Bilateral upper extremity supported Standing balance-Leahy Scale: Poor Standing balance comment: minG-minA with BUE of RW to maintain static standing balance                             Pertinent Vitals/Pain Pain Assessment: No/denies pain    Home Living Family/patient expects to be discharged to:: Private residence Living Arrangements: Spouse/significant other Available Help at Discharge: Family;Available 24 hours/day Type of Home: House([place] Home Access: Stairs to enter Entrance Stairs-Rails: None Entrance Stairs-Number of Steps: 2 Home Layout: Two level Home Equipment: Walker - 4 wheels;Wheelchair - manual      Prior Function Level of Independence: Independent  Comments: pt working Printmaker Dominance        Extremity/Trunk Assessment   Upper Extremity Assessment Upper Extremity Assessment: RUE deficits/detail;LUE deficits/detail RUE Deficits / Details: RUE grossly 4-/5 RUE Coordination: decreased fine motor(may be chronic 2/2 arthritis) LUE Deficits / Details: Grossly 4/5    Lower Extremity Assessment Lower Extremity Assessment: RLE deficits/detail RLE Deficits / Details: grossly 4-/5 other than 2-/5 L DF RLE Sensation: decreased light touch RLE Coordination: decreased gross motor    Cervical / Trunk Assessment Cervical / Trunk Assessment: Normal  Communication   Communication: Prefers language other than English(bosnian)  Cognition Arousal/Alertness: Awake/alert Behavior During Therapy: WFL for tasks assessed/performed Overall Cognitive Status: Within Functional Limits for tasks assessed                                        General Comments General comments (skin integrity, edema, etc.): VSS on RA    Exercises     Assessment/Plan    PT Assessment Patient needs continued PT services  PT Problem List Decreased strength;Decreased activity tolerance;Decreased balance;Decreased mobility;Decreased coordination;Decreased knowledge of use of DME;Decreased safety awareness;Decreased knowledge of precautions;Impaired sensation       PT Treatment Interventions DME instruction;Gait training;Stair training;Functional mobility training;Therapeutic activities;Therapeutic exercise;Balance training;Neuromuscular re-education;Patient/family education    PT Goals (Current goals can be found in the Care Plan section)  Acute Rehab PT Goals Patient Stated Goal: To return to independent mobility PT Goal Formulation: With patient Time For Goal Achievement: 07/10/19 Potential to Achieve Goals: Good    Frequency Min 5X/week   Barriers to discharge        Co-evaluation                AM-PAC PT "6 Clicks" Mobility  Outcome Measure Help needed turning from your back to your side while in a flat bed without using bedrails?: A Little Help needed moving from lying on your back to sitting on the side of a flat bed without using bedrails?: A Little Help needed moving to and from a bed to a chair (including a wheelchair)?: A Lot Help needed standing up from a chair using your arms (e.g., wheelchair or bedside chair)?: A Lot Help needed to walk in hospital room?: A Lot Help needed climbing 3-5 steps with a railing? : Total 6 Click Score: 13    End of Session   Activity Tolerance: Patient tolerated treatment well Patient left: in chair;with call bell/phone within reach;with chair alarm set Nurse Communication: Mobility status PT Visit Diagnosis: Unsteadiness on feet (R26.81);Muscle weakness (generalized) (M62.81);Other symptoms and signs involving the nervous system (R29.898)    Time: [id number] PT Time Calculation (min) (ACUTE ONLY): 50 min   Charges:   PT Evaluation $PT Eval Moderate Complexity: 1 Mod PT Treatments $Therapeutic Activity: 8-22 mins        Zenaida Niece, PT, DPT Acute Rehabilitation Pager: [phone]   Zenaida Niece 06/26/2019, 10:35 AM

## 2019-06-26 NOTE — Progress Notes (Signed)
Patient ID: Alan Riley, male   DOB: 11/13/1945, 74 y.o.   MRN: [id number] Subjective: Patient reports a mild discomfort.  He seems to be moving his extremities well.  His incisions are clean dry and intact.  His wound is flat.  Foley catheter is in place.  Objective: Vital signs in last 24 hours: Temp:  [97.4 F (36.3 C)-98.6 F (37 C)] 98.6 F (37 C) (06/02 0444) Pulse Rate:  [64-110] 80 (06/02 0444) Resp:  [14-40] 21 (06/02 0444) BP: (130-175)/(68-101) 134/68 (06/02 0444) SpO2:  [95 %-98 %] 96 % (06/02 0444) Weight:  [78 kg] 78 kg (06/01 1208)  Intake/Output from previous day: 06/01 0701 - 06/02 0700 In: 1748.3 [I.V.:1628.3; IV Piggyback:120] Out: 1880 [Urine:1850; Blood:30] Intake/Output this shift: Total I/O In: 728.3 [I.V.:628.3; IV Piggyback:100] Out: 1850 [Urine:1850]    Lab Results: Lab Results  Component Value Date   WBC 8.4 06/25/2019   HGB 15.1 06/25/2019   HCT 45.3 06/25/2019   MCV 97.2 06/25/2019   PLT 168 06/25/2019   No results found for: INR, PROTIME BMET Lab Results  Component Value Date   NA 140 06/25/2019   K 3.9 06/25/2019   CL 104 06/25/2019   CO2 25 06/25/2019   GLUCOSE 97 06/25/2019   BUN 9 06/25/2019   CREATININE 0.61 06/25/2019   CALCIUM 9.0 06/25/2019    Studies/Results: DG Cervical Spine 2-3 Views  Result Date: 06/25/2019 CLINICAL DATA:  Cervical fusion EXAM: CERVICAL SPINE - 2-3 VIEW COMPARISON:  MRI from 06/18/2019 FLUOROSCOPY TIME:  Radiation Exposure Index (as provided by the fluoroscopic device): 4.56 mGy If the device does not provide the exposure index: Fluoroscopy Time:  12 seconds Number of Acquired Images:  1 FINDINGS: Postsurgical changes are noted at C4-5 with interbody fusion and anterior fixation. Numbering nomenclature is similar to that utilized on prior MRI. IMPRESSION: C4-5 fusion. Electronically Signed   By: Inez Catalina M.D.   On: 06/25/2019 21:17   DG Lumbar Spine 2-3 Views  Result Date: 06/25/2019 CLINICAL DATA:   Cervical fusion and lumbar laminectomy EXAM: DG C-ARM 1-60 MIN; LUMBAR SPINE - 2-3 VIEW COMPARISON:  06/20/2019 FLUOROSCOPY TIME:  Fluoroscopy Time:  6 seconds Radiation Exposure Index (if provided by the fluoroscopic device): 4.15 mGy Number of Acquired Spot Images: 1 FINDINGS: Mild anterolisthesis is again noted of L4 on L5. surgical instruments are noted just posterior to the L3-4 interspace. IMPRESSION: Intraoperative localization at L3-4. Electronically Signed   By: Inez Catalina M.D.   On: 06/25/2019 21:16   DG C-Arm 1-60 Min  Result Date: 06/25/2019 CLINICAL DATA:  Cervical fusion and lumbar laminectomy EXAM: DG C-ARM 1-60 MIN; LUMBAR SPINE - 2-3 VIEW COMPARISON:  06/20/2019 FLUOROSCOPY TIME:  Fluoroscopy Time:  6 seconds Radiation Exposure Index (if provided by the fluoroscopic device): 4.15 mGy Number of Acquired Spot Images: 1 FINDINGS: Mild anterolisthesis is again noted of L4 on L5. surgical instruments are noted just posterior to the L3-4 interspace. IMPRESSION: Intraoperative localization at L3-4. Electronically Signed   By: Inez Catalina M.D.   On: 06/25/2019 21:16    Assessment/Plan: Seems to be doing well.  Physical and occupational therapy today.  Continue Foley catheter but start Flomax.  He will likely need rehab.  Estimated body mass index is 26.15 kg/m as calculated from the following:   Height as of this encounter: 5\' 8"  (1.727 m).   Weight as of this encounter: 78 kg.    LOS: 1 day    [name] 06/26/2019, 6:41  AM

## 2019-06-26 NOTE — Anesthesia Postprocedure Evaluation (Signed)
Anesthesia Post Note  Patient: Alan Riley  Procedure(s) Performed: C4-5 ACDF (N/A ) L3-4 LUMBAR LAMINECTOMY/DECOMPRESSION MICRODISCECTOMY (Bilateral Back)     Patient location during evaluation: PACU Anesthesia Type: General Level of consciousness: sedated and patient cooperative Pain management: pain level controlled Vital Signs Assessment: post-procedure vital signs reviewed and stable Respiratory status: spontaneous breathing Cardiovascular status: stable Anesthetic complications: no    Last Vitals:  Vitals:   06/26/19 0444 06/26/19 0821  BP: 134/68 137/70  Pulse: 80 81  Resp: (!) 21 18  Temp: 37 C 36.9 C  SpO2: 96% 96%    Last Pain:  Vitals:   06/26/19 0821  TempSrc: Oral  PainSc:                  [name]

## 2019-06-27 MED FILL — Thrombin For Soln 5000 Unit: CUTANEOUS | Qty: 5000 | Status: AC

## 2019-06-27 NOTE — Progress Notes (Signed)
Inpatient Rehabilitation-Admissions Coordinator   Met with pt and his family bedside for IP Rehab assessment. Attempted to used [place]ian intrepter via [employer] but there was a bad connection; Pt's daughter volunteer to interpret. Based on pt's PLOF, diagnosis, support system at DC, and current level of function, feel pt is a great candidate for CIR at this time. I reviewed program details, anticipated LOS, and expected functional outcomes at DC. Pt and his family would like to pursue this program if insurance approves. I have initiated insurance authorization process for possible admit. Will update once there has been a determination.   Kelly Gentry, OTR/L  Rehab Admissions Coordinator  [phone] 06/27/2019 11:59 AM  

## 2019-06-27 NOTE — Progress Notes (Signed)
Patient ID: Alan Riley, male   DOB: 1945/07/12, 74 y.o.   MRN: [id number] He looks really good.  He is moving all extremities well.  His dressing is flat and dry.  He is got good strength.  He smiling and happy.  He is very thankful.  Continue to mobilize with therapy and await CIR.

## 2019-06-27 NOTE — Progress Notes (Signed)
Physical Therapy Treatment Patient Details Name: Alan Riley MRN: [id number] DOB: 1945/06/27 Today's Date: 06/27/2019    History of Present Illness 74 y.o. male admitted for gait disturbance. The patient first presented to me with complaints of neck pain, numbness of the arm(s) and back pain and weakness in the legs. Onset of symptoms was several weeks ago. Imaging with findings of HNP/spondylosis/stenosis at C4-5 and L3-4. Pt underwent decompressive anterior cervical discectomy C4-5 with arthrodesis, and L3-4 decompressive lumbar laminectomy with medial facetectomy foraminotomies bilaterally with discectomy.    PT Comments    Pt tolerated treatment well demonstrating improved RLE strength and function. Pt requires less physical assistance to mobilize in bed and transfer, and is able to increase ambulation tolerance this session. Pt does continue to demonstrate significant gait deviations which increase his falls risk including narrow BOS with intermittent scissoring, increased knee and trunk flexion during stance phase on RLE, and poor device management. Pt will continue to benefit from acute PT POC to reduce falls risk and aide in a return to independent mobility. PT continues to recommend CIR.   Follow Up Recommendations  CIR;Supervision/Assistance - 24 hour     Equipment Recommendations  Rolling walker with 5" wheels;3in1 (PT)    Recommendations for Other Services       Precautions / Restrictions Precautions Precautions: Fall;Cervical;Back Precaution Booklet Issued: No Precaution Comments: PT verbally reviews precautions with use of [place] Interpreter, pt requires cues for no lifting but is able to recall other 2 back precautions Restrictions Weight Bearing Restrictions: No    Mobility  Bed Mobility Overal bed mobility: Needs Assistance Bed Mobility: Rolling;Sidelying to Sit Rolling: Supervision Sidelying to sit: Supervision       General bed mobility comments: cues to  maintain log roll  Transfers Overall transfer level: Needs assistance Equipment used: Rolling walker (2 wheeled) Transfers: Sit to/from Stand Sit to Stand: Min guard         General transfer comment: PT provides cues for stand to sit utilizing hands to feel chair and slow descent  Ambulation/Gait Ambulation/Gait assistance: Min assist Gait Distance (Feet): 60 Feet(additional trials of 5', 20') Assistive device: Rolling walker (2 wheeled) Gait Pattern/deviations: Step-to pattern;Narrow base of support Gait velocity: reduced Gait velocity interpretation: <1.8 ft/sec, indicate of risk for recurrent falls General Gait Details: pt with increased R knee flexion during stance phase, very narrowed base of support width with intermittent scissoring noted. PT provides cues to widen BOS which improves gait quality. PT also provides cues to roll walker along ground rather than picking it up to advance   Stairs             Wheelchair Mobility    Modified Rankin (Stroke Patients Only)       Balance Overall balance assessment: Needs assistance Sitting-balance support: Single extremity supported;Feet supported Sitting balance-Leahy Scale: Good Sitting balance - Comments: supervision   Standing balance support: Bilateral upper extremity supported Standing balance-Leahy Scale: Fair Standing balance comment: minG with BUE support of RW                            Cognition Arousal/Alertness: Awake/alert Behavior During Therapy: WFL for tasks assessed/performed Overall Cognitive Status: Within Functional Limits for tasks assessed                                        Exercises  General Comments General comments (skin integrity, edema, etc.): VSS      Pertinent Vitals/Pain Pain Assessment: Faces Faces Pain Scale: Hurts little more Pain Location: back Pain Descriptors / Indicators: Sore Pain Intervention(s): Monitored during session     Home Living                      Prior Function            PT Goals (current goals can now be found in the care plan section) Acute Rehab PT Goals Patient Stated Goal: To return to independent mobility Progress towards PT goals: Progressing toward goals    Frequency    Min 5X/week      PT Plan Current plan remains appropriate    Co-evaluation              AM-PAC PT "6 Clicks" Mobility   Outcome Measure  Help needed turning from your back to your side while in a flat bed without using bedrails?: None Help needed moving from lying on your back to sitting on the side of a flat bed without using bedrails?: None Help needed moving to and from a bed to a chair (including a wheelchair)?: A Little Help needed standing up from a chair using your arms (e.g., wheelchair or bedside chair)?: A Little Help needed to walk in hospital room?: A Little Help needed climbing 3-5 steps with a railing? : A Lot 6 Click Score: 19    End of Session Equipment Utilized During Treatment: Gait belt Activity Tolerance: Patient tolerated treatment well Patient left: in chair;with call bell/phone within reach;with chair alarm set Nurse Communication: Mobility status PT Visit Diagnosis: Unsteadiness on feet (R26.81);Muscle weakness (generalized) (M62.81);Other symptoms and signs involving the nervous system (R29.898)     Time: [id number] PT Time Calculation (min) (ACUTE ONLY): 1353 min  Charges:  $Gait Training: 23-37 mins                     Zenaida Niece, PT, DPT Acute Rehabilitation Pager: [phone]    Zenaida Niece 06/27/2019, 3:46 PM

## 2019-06-27 NOTE — PMR Pre-admission (Addendum)
PMR Admission Coordinator Pre-Admission Assessment  Patient: Alan Riley is an 74 y.o., male MRN: [id number] DOB: 1945/09/08 Height: 5' 8"  (172.7 cm) Weight: 78 kg  Insurance Information HMO: yes    PPO:      PCP:      IPA:      80/20:      OTHER:  PRIMARY: [employer] Medicare      Policy#: [id number]      Subscriber: patient CM Name: Lattie Haw     Phone#: [phone] [phone]     Fax#: [phone] Pre-Cert#: [id number]      Employer:  Josem Kaufmann provided by Lattie Haw for admit to CIR on 06/28/19. Weekly updates are due to Vara Guardian (p): [phone] [id number] (f): [phone] Benefits:  Phone #: [phone]     Name: online at [email or web address], transaction ID #[id number] Eff. Date: 01/25/2019-01/24/2020     Deduct: does not have for in-network providers      Out of Pocket Max: $3,900 ($0 met)      Life Max: NA CIR: $295/day co-pay for days 1-6, $0/day co-pay for days 7-90.       SNF: $0/day co-pay for days 1-20, $184/day co-pay for days 21-100; limited to 100 days/cal yr Outpatient: $10-$40/visit co-pay pending service; visits limited by medical necessity  Home Health: 100% coverage, 0% co-insurance; limited by medical necessity     DME: 80% coverage     Co-Pay: 20% co-insurance Providers:  SECONDARY: None      Policy#:      Phone#:   Development worker, community:       Phone#:   The Engineer, petroleum" for patients in Inpatient Rehabilitation Facilities with attached "Privacy Act Bee Records" was provided and verbally reviewed with: Patient and Family  Emergency Contact Information Contact Information    Name Relation Home Work [place] Daughter   [phone]   Hillis, Mcphatter   [phone]   terryn, redner [phone]  [phone]      Current Medical History  Patient Admitting Diagnosis: Severe cervical spinal senosis at C4-5, severe lumbar spinal stenosis L3-4 with disc herniation, and gait disturbance; S/p surgical decompression    History of Present Illness: Pt is a 38 you Male admitted to [facility] on [facility]/25/19 with gait disturbance. He has had ongoing symptoms of neck pain, numbness of the arms, back pain, and weakness of the legs that started several weeks ago. His symptoms have been progressive and per patient report he had progressed from Independent ambulation to a wheelchair. Work up revealed servere cervical spinal stenosis C4-5 and severe lumbar spinal stenosis L3-4 with disc herniation. On 06/25/19 pt underwent a decompressive anterior cervical discectomy C4-5, anterior cervical arthrodesis C4-5 utilizing a corticocancellus allograft bone graft, anterior cervical plating C4-5 utilizing a Alphatec plate, and decompressive lumbar laminectomy, medial facetectomy foraminotomies L3-4 bilaterally with discectomy. Pt had some difficulties urinating post surgery and a coude catheter was recommended. Pt has been seen by therapies with recommendation for CIR. Pt is to admit to CIR on 06/28/19.     Patient's medical record from [facility] has been reviewed by the rehabilitation admission coordinator and physician.  Past Medical History  Past Medical History:  Diagnosis Date  . Basal cell carcinoma of skin 2019   scalp  . HLD (hyperlipidemia)   . Hx of adenomatous colonic polyps   . Iron deficiency anemia secondary to blood loss (chronic)   . Malignant neoplasm of ascending colon ([place]) 2004  . Partial small bowel obstruction ([place])  12/29/2013  . Uses wheelchair     Family History   family history includes Diabetes in his brother and mother.  Prior Rehab/Hospitalizations Has the patient had prior rehab or hospitalizations prior to admission? No  Has the patient had major surgery during 100 days prior to admission? Yes   Current Medications  Current Facility-Administered Medications:  .  0.9 %  sodium chloride infusion, 250 mL, Intravenous, Continuous, [name], MD, Last Rate: 1 mL/hr at 06/26/19 0023,  250 mL at 06/26/19 0023 .  0.9 % NaCl with KCl 20 mEq/ L  infusion, , Intravenous, Continuous, [name], MD, Stopped at 06/27/19 0000 .  acetaminophen (TYLENOL) tablet 650 mg, 650 mg, Oral, Q4H PRN, 650 mg at 06/27/19 1634 **OR** acetaminophen (TYLENOL) suppository 650 mg, 650 mg, Rectal, Q4H PRN, [name], MD .  bisacodyl (DULCOLAX) EC tablet 10 mg, 10 mg, Oral, Daily PRN, Meyran, Ocie Cornfield, NP .  Chlorhexidine Gluconate Cloth 2 % PADS 6 each, 6 each, Topical, Daily, [name], MD, 6 each at 06/28/19 1006 .  dexamethasone (DECADRON) injection 4 mg, 4 mg, Intravenous, Q6H, 4 mg at 06/26/19 0519 **OR** dexamethasone (DECADRON) tablet 4 mg, 4 mg, Oral, Q6H, [name], MD, 4 mg at 06/28/19 1121 .  docusate sodium (COLACE) capsule 100 mg, 100 mg, Oral, BID, Meyran, Ocie Cornfield, NP, 100 mg at 06/28/19 1311 .  menthol-cetylpyridinium (CEPACOL) lozenge 3 mg, 1 lozenge, Oral, PRN **OR** phenol (CHLORASEPTIC) mouth spray 1 spray, 1 spray, Mouth/Throat, PRN, [name], MD .  methocarbamol (ROBAXIN) tablet 500 mg, 500 mg, Oral, Q6H PRN **OR** methocarbamol (ROBAXIN) 500 mg in dextrose 5 % 50 mL IVPB, 500 mg, Intravenous, Q6H PRN, [name], MD .  morphine 2 MG/ML injection 2 mg, 2 mg, Intravenous, Q2H PRN, [name], MD .  ondansetron [facility]) tablet 4 mg, 4 mg, Oral, Q6H PRN **OR** ondansetron (ZOFRAN) injection 4 mg, 4 mg, Intravenous, Q6H PRN, [name], MD .  oxyCODONE (Oxy IR/ROXICODONE) immediate release tablet 5 mg, 5 mg, Oral, Q3H PRN, [name], MD, 5 mg at 06/26/19 0518 .  polyethylene glycol (MIRALAX / GLYCOLAX) packet 17 g, 17 g, Oral, Daily PRN, Meyran, Ocie Cornfield, NP, 17 g at 06/28/19 1312 .  senna (SENOKOT) tablet 8.6 mg, 1 tablet, Oral, BID, [name], MD, 8.6 mg at 06/28/19 1121 .  sodium chloride flush (NS) 0.9 % injection 3 mL, 3 mL, Intravenous, Q12H, [name], MD, 3 mL at 06/28/19 1119 .  sodium chloride flush (NS) 0.9  % injection 3 mL, 3 mL, Intravenous, PRN, [name], MD .  tamsulosin [facility]) capsule 0.4 mg, 0.4 mg, Oral, QPC breakfast, [name], MD, 0.4 mg at 06/28/19 1121  Patients Current Diet:  Diet Order            Diet - low sodium heart healthy        Diet regular Room service appropriate? Yes; Fluid consistency: Thin  Diet effective now              Precautions / Restrictions Precautions Precautions: Fall, Cervical, Back Precaution Booklet Issued: No Precaution Comments: PT verbally reviews precautions with use of Bosnian Interpreter, pt requires cues for no lifting but is able to recall other 2 back precautions Restrictions Weight Bearing Restrictions: No   Has the patient had 2 or more falls or a fall with injury in the past year? No  Prior Activity Level  Community (5-7x/wk): very active prior to 3 week decline (worked full time at [employer], drove, was independent in ADLs   Prior Functional Level *pt was independent prior to decline occurring May 5th.  Self Care: Did the patient need help bathing, dressing, using the toilet or eating? Independent  Indoor Mobility: Did the patient need assistance with walking from room to room (with or without device)? Independent  Stairs: Did the patient need assistance with internal or external stairs (with or without device)? Independent  Functional Cognition: Did the patient need help planning regular tasks such as shopping or remembering to take medications? Independent  Home Assistive Devices / Equipment Home Assistive Devices/Equipment: Eyeglasses, Dentures (specify type), Wheelchair, Hand-held shower hose, Shower chair with back Home Equipment: Walker - 4 wheels, Wheelchair - manual  Prior Device Use: Indicate devices/aids used by the patient prior to current illness, exacerbation or injury? no AD prior to decline; after decline, required wc  Current Functional Level Cognition  Overall Cognitive Status: Within  Functional Limits for tasks assessed Orientation Level: Oriented X4 General Comments: difficult to assess at times 2/2 language barrier but conversation appears [facility] during session    Extremity Assessment (includes Sensation/Coordination)  Upper Extremity Assessment: RUE deficits/detail RUE Deficits / Details: RUE grossly 4-/5;pt reports some numbness in hand, reports it is improved following surgery RUE Coordination: decreased fine motor(may be chronic 2/2 arthritis) LUE Deficits / Details: Grossly 4/5  Lower Extremity Assessment: Defer to PT evaluation RLE Deficits / Details: grossly 4-/5 other than 2-/5 L DF RLE Sensation: decreased light touch RLE Coordination: decreased gross motor    ADLs  Overall ADL's : Needs assistance/impaired Eating/Feeding: Set up, Sitting Grooming: Set up, Sitting Upper Body Bathing: Min guard, Sitting Lower Body Bathing: Moderate assistance, Sit to/from stand Upper Body Dressing : Min guard, Sitting Lower Body Dressing: Moderate assistance, Sit to/from stand Toilet Transfer: Moderate assistance, Stand-pivot, RW Toilet Transfer Details (indicate cue type and reason): simulated to recliner;max cues for safe use of RW Toileting- Clothing Manipulation and Hygiene: Moderate assistance, Sit to/from stand Toileting - Clothing Manipulation Details (indicate cue type and reason): from lower surface Functional mobility during ADLs: Moderate assistance, Rolling walker General ADL Comments: pt limited by weakness, decreased activity tolerance, and numbness in RUE    Mobility  Overal bed mobility: Needs Assistance Bed Mobility: Rolling, Sidelying to Sit Rolling: Supervision Sidelying to sit: Supervision General bed mobility comments: cues to maintain log roll    Transfers  Overall transfer level: Needs assistance Equipment used: Rolling walker (2 wheeled) Transfers: Sit to/from Stand Sit to Stand: Min guard Stand pivot transfers: Mod assist General transfer  comment: PT provides cues for stand to sit utilizing hands to feel chair and slow descent    Ambulation / Gait / Stairs / Wheelchair Mobility  Ambulation/Gait Ambulation/Gait assistance: Min guard, Min assist Gait Distance (Feet): 80 Feet Assistive device: Rolling walker (2 wheeled) Gait Pattern/deviations: Step-to pattern, Narrow base of support, Trunk flexed General Gait Details: pt with narrow BOS to begin ambulation, requires verbal cues to widen BOS. Pt reverts to narrow BOS with fatigue. Pt also demonstrating some R knee hyperextension with fatigue as well as increased lateral movement of pelvis with some signs of trendelenburg gait due to R hip abductor weakness. Pt also requiring cues to maintain RW closer to BOS throughout ambulation Gait velocity: reduced Gait velocity interpretation: <1.8 ft/sec, indicate of risk for recurrent falls    Posture / Balance Dynamic Sitting Balance Sitting balance - Comments: supervision  Balance Overall balance assessment: Needs assistance Sitting-balance support: No upper extremity supported, Feet supported Sitting balance-Leahy Scale: Good Sitting balance - Comments: supervision Standing balance support: Single extremity supported, During functional activity Standing balance-Leahy Scale: Fair Standing balance comment: minG-close supervision with UE support of RW    Special needs/care consideration Continuous Drip IV: 0.9% sodium chloride infusion, 0.9% NaCl with KCl 20 mEq/L infusion    Skin: surgical incision to back and neck  Special service needs: Will need Bosnian interpreter    Designated visitor: wife (Sema), and daughter Council Mechanic) -Radana offered to be interpreter.    Previous Home Environment (from acute therapy documentation) Living Arrangements: Spouse/significant other Available Help at Discharge: Family, Available 24 hours/day Type of Home: House(town ho[place]) Home Layout: Two level Alternate Level Stairs-Rails: Right,  Left Alternate Level Stairs-Number of Steps: 10 Home Access: Stairs to enter Entrance Stairs-Rails: None Entrance Stairs-Number of Steps: 2 Bathroom Shower/Tub: Chiropodist: [place]: No  Discharge Living Setting Plans for Discharge Living Setting: Patient's home, Lives with (comment)(wife) Type of Home at Discharge: House Discharge Home Layout: Two level, Bed/bath upstairs Alternate Level Stairs-Rails: Left Alternate Level Stairs-Number of Steps: 7 with landing then 7 again Discharge Home Access: Stairs to enter Entrance Stairs-Rails: None Entrance Stairs-Number of Steps: 3 Discharge Bathroom Shower/Tub: Tub/shower unit Discharge Bathroom Toilet: Standard Discharge Bathroom Accessibility: Yes How Accessible: Accessible via walker Does the patient have any problems obtaining your medications?: No  Social/Family/Support Systems Patient Roles: Spouse, Other (Comment)(full time employee) Contact Information: daughter Amedeo Plenty english): [phone]; spouse (sema): [phone];  Anticipated Caregiver: wife Sema Anticipated Caregiver's Contact Information: see above Ability/Limitations of Caregiver: Min A Caregiver Availability: 24/7 Discharge Plan Discussed with Primary Caregiver: Yes(pt, wife, and daughter) Is Caregiver In Agreement with Plan?: Yes Does Caregiver/Family have Issues with Lodging/Transportation while Pt is in Rehab?: No  Goals Patient/Family Goal for Rehab: PT/OT Mod I/Supervision; SLP: NA Expected length of stay: 10-14 days Cultural Considerations: Moved from [place] approx 18 years ago. Speaks Bosnian -will need intrepreter Pt/Family Agrees to Admission and willing to participate: Yes Program Orientation Provided & Reviewed with Pt/Caregiver Including Roles  & Responsibilities: Yes(pt, wife, and daughter)  Barriers to Discharge: Home environment access/layout  Barriers to Discharge Comments: stairs to enter home and  stairs to get to bedroom  Decrease burden of Care through IP rehab admission: NA  Possible need for SNF placement upon discharge: Not anticipated; pt has great family support and anticipate pt can reach a Mod I/Supervision level quickly through CIR stay to allow him to return home safely.   Patient Condition: I have reviewed medical records from [facility], spoken with RN, and patient, spouse and daughter. I met with patient at the bedside for inpatient rehabilitation assessment.  Patient will benefit from ongoing PT and OT, can actively participate in 3 hours of therapy a day 5 days of the week, and can make measurable gains during the admission.  Patient will also benefit from the coordinated team approach during an Inpatient Acute Rehabilitation admission.  The patient will receive intensive therapy as well as Rehabilitation physician, nursing, social worker, and care management interventions.  Due to safety, skin/wound care, disease management, medication administration, pain management and patient education the patient requires 24 hour a day rehabilitation nursing.  The patient is currently Min G/Min A with mobility and Min G to Mod A for basic ADLs.  Discharge setting and therapy post discharge at home with home health is anticipated.  Patient  has agreed to participate in the Acute Inpatient Rehabilitation Program and will admit 06/28/19.  Preadmission Screen Completed By:  Raechel Ache, 06/28/2019 3:38 PM ______________________________________________________________________   Discussed status with Dr. Posey Pronto on 06/28/19 at 3:36PM and received approval for admission today.  Admission Coordinator:  Raechel Ache, OT, time 3:36PM/Date 06/28/19   Assessment/Plan: Diagnosis: Radiculopathy  1. Does the need for close, 24 hr/day Medical supervision in concert with the patient's rehab needs make it unreasonable for this patient to be served in a less intensive setting?  Yes/potentially 2. Co-Morbidities requiring supervision/potential complications: Partial SBO, history of colon cancer, dyslipidemia 3. Due to safety, skin/wound care, disease management, pain management and patient education, does the patient require 24 hr/day rehab nursing?  4. Does the patient require coordinated care of a physician, rehab nurse, PT, OT, and SLP to address physical and functional deficits in the context of the above medical diagnosis(es)? Potentially Addressing deficits in the following areas: balance, endurance, locomotion, strength, transferring, bathing, dressing, toileting and psychosocial support 5. Can the patient actively participate in an intensive therapy program of at least 3 hrs of therapy 5 days a week? Yes 6. The potential for patient to make measurable gains while on inpatient rehab is excellent 7. Anticipated functional outcomes upon discharge from inpatient rehab: modified independent and supervision PT, modified independent and supervision OT, n/a SLP 8. Estimated rehab length of stay to reach the above functional goals is: 7-11 days. 9. Anticipated discharge destination: Home 10. Overall Rehab/Functional Prognosis: excellent   MD Signature: Delice Lesch, MD, ABPMR

## 2019-06-28 ENCOUNTER — Encounter (HOSPITAL_COMMUNITY): Payer: Self-pay | Admitting: Physical Medicine & Rehabilitation

## 2019-06-28 ENCOUNTER — Inpatient Hospital Stay (HOSPITAL_COMMUNITY)
Admission: RE | Admit: 2019-06-28 | Discharge: 2019-07-09 | DRG: 093 | Disposition: A | Discharge status: Home Health | Payer: Medicare HMO | Source: Intra-hospital | Attending: Physical Medicine & Rehabilitation | Admitting: Physical Medicine & Rehabilitation

## 2019-06-28 DIAGNOSIS — Z9049 Acquired absence of other specified parts of digestive tract: Secondary | ICD-10-CM | POA: Diagnosis not present

## 2019-06-28 DIAGNOSIS — G8918 Other acute postprocedural pain: Secondary | ICD-10-CM | POA: Diagnosis not present

## 2019-06-28 DIAGNOSIS — Z79899 Other long term (current) drug therapy: Secondary | ICD-10-CM | POA: Diagnosis not present

## 2019-06-28 DIAGNOSIS — E785 Hyperlipidemia, unspecified: Secondary | ICD-10-CM | POA: Diagnosis not present

## 2019-06-28 DIAGNOSIS — K5903 Drug induced constipation: Secondary | ICD-10-CM

## 2019-06-28 DIAGNOSIS — Z85828 Personal history of other malignant neoplasm of skin: Secondary | ICD-10-CM

## 2019-06-28 DIAGNOSIS — R7303 Prediabetes: Secondary | ICD-10-CM | POA: Diagnosis present

## 2019-06-28 DIAGNOSIS — R739 Hyperglycemia, unspecified: Secondary | ICD-10-CM

## 2019-06-28 DIAGNOSIS — Z981 Arthrodesis status: Secondary | ICD-10-CM | POA: Diagnosis not present

## 2019-06-28 DIAGNOSIS — R0989 Other specified symptoms and signs involving the circulatory and respiratory systems: Secondary | ICD-10-CM | POA: Diagnosis not present

## 2019-06-28 DIAGNOSIS — Z9889 Other specified postprocedural states: Secondary | ICD-10-CM

## 2019-06-28 DIAGNOSIS — T380X5A Adverse effect of glucocorticoids and synthetic analogues, initial encounter: Secondary | ICD-10-CM | POA: Diagnosis not present

## 2019-06-28 DIAGNOSIS — Z85038 Personal history of other malignant neoplasm of large intestine: Secondary | ICD-10-CM | POA: Diagnosis not present

## 2019-06-28 DIAGNOSIS — E876 Hypokalemia: Secondary | ICD-10-CM | POA: Diagnosis not present

## 2019-06-28 DIAGNOSIS — Z86010 Personal history of colonic polyps: Secondary | ICD-10-CM

## 2019-06-28 DIAGNOSIS — R339 Retention of urine, unspecified: Secondary | ICD-10-CM

## 2019-06-28 DIAGNOSIS — E46 Unspecified protein-calorie malnutrition: Secondary | ICD-10-CM | POA: Diagnosis not present

## 2019-06-28 DIAGNOSIS — Z993 Dependence on wheelchair: Secondary | ICD-10-CM

## 2019-06-28 DIAGNOSIS — M5416 Radiculopathy, lumbar region: Secondary | ICD-10-CM | POA: Diagnosis not present

## 2019-06-28 DIAGNOSIS — M5410 Radiculopathy, site unspecified: Secondary | ICD-10-CM | POA: Diagnosis present

## 2019-06-28 DIAGNOSIS — E8809 Other disorders of plasma-protein metabolism, not elsewhere classified: Secondary | ICD-10-CM | POA: Diagnosis not present

## 2019-06-28 DIAGNOSIS — M5020 Other cervical disc displacement, unspecified cervical region: Secondary | ICD-10-CM

## 2019-06-28 DIAGNOSIS — R2689 Other abnormalities of gait and mobility: Principal | ICD-10-CM | POA: Diagnosis present

## 2019-06-28 DIAGNOSIS — D72829 Elevated white blood cell count, unspecified: Secondary | ICD-10-CM

## 2019-06-28 DIAGNOSIS — D72823 Leukemoid reaction: Secondary | ICD-10-CM | POA: Diagnosis not present

## 2019-06-28 DIAGNOSIS — D72828 Other elevated white blood cell count: Secondary | ICD-10-CM | POA: Diagnosis not present

## 2019-06-28 DIAGNOSIS — R030 Elevated blood-pressure reading, without diagnosis of hypertension: Secondary | ICD-10-CM | POA: Diagnosis present

## 2019-06-28 DIAGNOSIS — M7989 Other specified soft tissue disorders: Secondary | ICD-10-CM | POA: Diagnosis not present

## 2019-06-28 LAB — GLUCOSE, CAPILLARY: Glucose-Capillary: 172 mg/dL — ABNORMAL HIGH (ref 70–99)

## 2019-06-28 MED ORDER — DOCUSATE SODIUM 100 MG PO CAPS
100.0000 mg | ORAL_CAPSULE | Freq: Two times a day (BID) | ORAL | Status: DC
  Administered 2019-06-28 – 2019-07-09 (×21): 100 mg via ORAL
  Filled 2019-06-28 (×21): qty 1

## 2019-06-28 MED ORDER — DEXAMETHASONE 4 MG PO TABS
4.0000 mg | ORAL_TABLET | Freq: Four times a day (QID) | ORAL | Status: DC
  Administered 2019-06-28 – 2019-07-02 (×15): 4 mg via ORAL
  Filled 2019-06-28 (×15): qty 1

## 2019-06-28 MED ORDER — DEXAMETHASONE SODIUM PHOSPHATE 4 MG/ML IJ SOLN
4.0000 mg | Freq: Four times a day (QID) | INTRAMUSCULAR | Status: DC
  Filled 2019-06-28 (×16): qty 1

## 2019-06-28 MED ORDER — METHOCARBAMOL 500 MG PO TABS: 500.0000 mg | ORAL_TABLET | Freq: Four times a day (QID) | ORAL | Status: DC | PRN

## 2019-06-28 MED ORDER — ONDANSETRON HCL 4 MG PO TABS: 4.0000 mg | ORAL_TABLET | Freq: Four times a day (QID) | ORAL | Status: DC | PRN

## 2019-06-28 MED ORDER — OXYCODONE HCL 5 MG PO TABS: 5.0000 mg | ORAL_TABLET | Freq: Four times a day (QID) | ORAL | 0 refills | Status: DC | PRN

## 2019-06-28 MED ORDER — ATORVASTATIN CALCIUM 10 MG PO TABS
20.0000 mg | ORAL_TABLET | Freq: Every day | ORAL | Status: DC
  Administered 2019-06-28 – 2019-07-09 (×12): 20 mg via ORAL
  Filled 2019-06-28 (×12): qty 2

## 2019-06-28 MED ORDER — POLYETHYLENE GLYCOL 3350 17 G PO PACK: 17.0000 g | PACK | Freq: Every day | ORAL | Status: DC | PRN

## 2019-06-28 MED ORDER — METHOCARBAMOL 1000 MG/10ML IJ SOLN
500.0000 mg | Freq: Four times a day (QID) | INTRAVENOUS | Status: DC | PRN
  Filled 2019-06-28: qty 5

## 2019-06-28 MED ORDER — ACETAMINOPHEN 325 MG PO TABS
650.0000 mg | ORAL_TABLET | ORAL | Status: DC | PRN
  Administered 2019-06-28: 650 mg via ORAL
  Filled 2019-06-28: qty 2

## 2019-06-28 MED ORDER — BISACODYL 5 MG PO TBEC: 10.0000 mg | DELAYED_RELEASE_TABLET | Freq: Every day | ORAL | Status: DC | PRN

## 2019-06-28 MED ORDER — POLYETHYLENE GLYCOL 3350 17 G PO PACK
17.0000 g | PACK | Freq: Every day | ORAL | Status: DC | PRN
  Administered 2019-06-28: 17 g via ORAL
  Filled 2019-06-28: qty 1

## 2019-06-28 MED ORDER — DOCUSATE SODIUM 100 MG PO CAPS
100.0000 mg | ORAL_CAPSULE | Freq: Two times a day (BID) | ORAL | Status: DC
  Administered 2019-06-28: 100 mg via ORAL
  Filled 2019-06-28: qty 1

## 2019-06-28 MED ORDER — SENNA 8.6 MG PO TABS
1.0000 | ORAL_TABLET | Freq: Two times a day (BID) | ORAL | Status: DC
  Administered 2019-06-28 – 2019-06-29 (×2): 8.6 mg via ORAL
  Filled 2019-06-28 (×2): qty 1

## 2019-06-28 MED ORDER — SORBITOL 70 % SOLN: 30.0000 mL | Freq: Every day | Status: DC | PRN

## 2019-06-28 MED ORDER — OXYCODONE HCL 5 MG PO TABS
5.0000 mg | ORAL_TABLET | ORAL | Status: DC | PRN
  Administered 2019-07-02: 5 mg via ORAL
  Filled 2019-06-28 (×3): qty 1

## 2019-06-28 MED ORDER — ONDANSETRON HCL 4 MG/2ML IJ SOLN: 4.0000 mg | Freq: Four times a day (QID) | INTRAMUSCULAR | Status: DC | PRN

## 2019-06-28 MED ORDER — TAMSULOSIN HCL 0.4 MG PO CAPS
0.4000 mg | ORAL_CAPSULE | Freq: Every day | ORAL | Status: DC
  Administered 2019-06-29 – 2019-07-09 (×11): 0.4 mg via ORAL
  Filled 2019-06-28 (×11): qty 1

## 2019-06-28 MED ORDER — ACETAMINOPHEN 650 MG RE SUPP: 650.0000 mg | RECTAL | Status: DC | PRN

## 2019-06-28 NOTE — Progress Notes (Signed)
Patient arrived to unit A/O x4 via bed. Patient speaks little english. Needs interpreter for communication. Denies any pain. Will continue to monitor. Amanda Cockayne, LPN

## 2019-06-28 NOTE — IPOC Note (Signed)
Individualized overall Plan of Care [facility]) Patient Details Name: Alan Riley MRN: [id number] DOB: 1945/11/10  Admitting Diagnosis: Radiculopathy  Hospital Problems: Principal Problem:   Radiculopathy     Functional Problem List: Nursing Bladder, Bowel, Medication Management, Safety, Sensory, Skin Integrity  PT Balance, Edema, Endurance, Motor, Pain, Perception, Safety, Sensory  OT Balance, Endurance, Motor, Pain, Safety  SLP    TR         Basic ADL's: OT Grooming, Bathing, Dressing, Toileting     Advanced  ADL's: OT       Transfers: PT Bed Mobility, Car, Bed to Chair, [employer], Floor  OT Toilet, Tub/Shower     Locomotion: PT Ambulation, Emergency planning/management officer, Stairs     Additional Impairments: OT None  SLP        TR      Anticipated Outcomes Item Anticipated Outcome  Self Feeding n/a  Swallowing      Basic self-care  supervison  Toileting  supervisoin   Bathroom Transfers supervision  Bowel/Bladder  Manage bowel/bladder with min assist  Transfers  supervision assist with LRAD  Locomotion  supervision assist with LRAD at ambulatory level with RW  Communication     Cognition     Pain  Pt will report pain, less than 3, manage w. min assist  Safety/Judgment  Pt oriented to saftey plan, instructed to call for assisstance, no falls during admission   Therapy Plan: PT Intensity: Minimum of 1-2 x/day ,45 to 90 minutes PT Frequency: 5 out of 7 days PT Duration Estimated Length of Stay: 7-10 days OT Intensity: Minimum of 1-2 x/day, 45 to 90 minutes OT Frequency: 5 out of 7 days OT Duration/Estimated Length of Stay: 7-10 days      Team Interventions: Nursing Interventions Patient/Family Education, Bladder Management, Bowel Management, Disease Management/Prevention, Pain Management, Medication Management, Skin Care/Wound Management, Discharge Planning, Psychosocial Support  PT interventions Ambulation/gait training, Discharge planning, Functional  mobility training, Visual/perceptual remediation/compensation, Therapeutic Activities, Psychosocial support, Balance/vestibular training, Disease management/prevention, Neuromuscular re-education, Skin care/wound management, Therapeutic Exercise, Wheelchair propulsion/positioning, Cognitive remediation/compensation, DME/adaptive equipment instruction, Pain management, Splinting/orthotics, UE/LE Strength taining/ROM, Community reintegration, Technical sales engineer stimulation, Patient/family education, UE/LE Coordination activities  OT Interventions Training and development officer, Engineer, drilling, Patient/family education, Therapeutic Activities, Cognitive remediation/compensation, Psychosocial support, Therapeutic Exercise, Community reintegration, Functional mobility training, UE/LE Strength taining/ROM, Self Care/advanced ADL retraining, Discharge planning, UE/LE Coordination activities, Pain management  SLP Interventions    TR Interventions    SW/CM Interventions     Barriers to Discharge MD  Medical stability and Foot drop  Nursing      PT Inaccessible home environment, Decreased caregiver support, Home environment Child psychotherapist, Insurance for SNF coverage    OT Other (comments) none known at this time  SLP      SW       Team Discharge Planning: Destination: PT-Home ,OT- Home , SLP-  Projected Follow-up: PT-Home health PT, OT-  Home health OT, 24 hour supervision/assistance, SLP-  Projected Equipment Needs: PT-To be determined, OT- To be determined, SLP-  Equipment Details: PT-pt has transport WC and RW per chart, OT-  Patient/family involved in discharge planning: PT- Patient,  OT-Patient, SLP-   MD ELOS: 7-10 days. Medical Rehab Prognosis:  Excellent Assessment: 74 year old right-handed non-English-speaking male from [place].  Past medical history of hyperlipidemia as well as back surgery 30 years ago.  Patient was independent up until approximately 3 weeks ago with  noted decreased functional mobility and unable to complete ADLs.  He presented on 06/25/2019 with gait  abnormality, neck pain, dysesthesias in upper extremities and weakness in lower extremities times several weeks.  Work-up and imaging revealed severe cervical spinal stenosis C4-5 as well as severe lumbar stenosis L3-4 disc herniation.  He underwent decompressive anterior cervical discectomy C4-5 anterior cervical arthrodesis C4-5 with anterior cervical plating as well as decompressive lumbar laminectomy medial for foraminotomies L3-4 bilaterally with discectomy on 06/25/2019 per Dr. Sherley Bounds.  No brace needed.  Decadron protocol as indicated.  Hospital course complicated by bouts of urinary retention and indwelling Foley was placed.  He is also had constipation.  Patient with resulting functional deficits with mobility, transfers, self-care.  We will set goals for Supervision with PT/OT.  Due to the current state of emergency, patients may not be receiving their 3-hours of Medicare-mandated therapy.  See Team Conference Notes for weekly updates to the plan of care

## 2019-06-28 NOTE — H&P (Signed)
Physical Medicine and Rehabilitation Admission H&P     HPI: Alan Riley is a 74 year old right-handed non-English-speaking male from [place].  History taken from chart review due to language.  Past medical history of hyperlipidemia as well as back surgery 30 years ago.  Patient lives with spouse.  Two-level home 2 steps to entry.  Patient was independent up until approximately 3 weeks ago with noted decreased functional mobility and unable to complete ADLs.  He presented on 06/25/2019 with gait abnormality, neck pain, dysesthesias in upper extremities and weakness in lower extremities times several weeks.  Work-up and imaging revealed severe cervical spinal stenosis C4-5 as well as severe lumbar stenosis L3-4 disc herniation.  He underwent decompressive anterior cervical discectomy C4-5 anterior cervical arthrodesis C4-5 with anterior cervical plating as well as decompressive lumbar laminectomy medial for foraminotomies L3-4 bilaterally with discectomy on 06/25/2019 per Dr. Sherley Bounds.  No brace needed.  Decadron protocol as indicated.  Hospital course complicated by bouts of urinary retention and indwelling Foley was placed.  He is also had constipation.  Therapy evaluation completed with recommendations of physical medicine rehab consult.  Please see preadmission assessment from earlier today as well.  Review of Systems  Unable to perform ROS: Language   Past Medical History:  Diagnosis Date  . Basal cell carcinoma of skin 2019   scalp  . HLD (hyperlipidemia)   . Hx of adenomatous colonic polyps   . Iron deficiency anemia secondary to blood loss (chronic)   . Malignant neoplasm of ascending colon ([place]) 2004  . Partial small bowel obstruction ([place]) 12/29/2013  . Uses wheelchair    Past Surgical History:  Procedure Laterality Date  . ANTERIOR CERVICAL DECOMP/DISCECTOMY FUSION N/A 06/25/2019   Procedure: C4-5 ACDF;  Surgeon: [name], MD;  Location: [facility];  Service: Neurosurgery;   Laterality: N/A;  . BACK SURGERY     over 30 yrs ago   . CHOLECYSTECTOMY  1991  . COLONOSCOPY  2013, 2016   x 2  . HEMICOLECTOMY  01/15/03  . LUMBAR LAMINECTOMY/DECOMPRESSION MICRODISCECTOMY Bilateral 06/25/2019   Procedure: L3-4 LUMBAR LAMINECTOMY/DECOMPRESSION MICRODISCECTOMY;  Surgeon: [name], MD;  Location: [facility];  Service: Neurosurgery;  Laterality: Bilateral;   Family History  Problem Relation Age of Onset  . Diabetes Mother   . Diabetes Brother    Social History:  reports that he has never smoked. He has never used smokeless tobacco. He reports previous alcohol use. He reports that he does not use drugs. Allergies: No Known Allergies Medications Prior to Admission  Medication Sig Dispense Refill  . acetaminophen (TYLENOL) 500 MG tablet Take 500-1,000 mg by mouth every 6 (six) hours as needed for mild pain or moderate pain.    Marland Kitchen atorvastatin (LIPITOR) 20 MG tablet Take 20 mg by mouth at bedtime.     . Cholecalciferol (VITAMIN D PO) Take 25 mcg by mouth daily.     Marland Kitchen acetaminophen (TYLENOL) 325 MG tablet Take 2 tablets (650 mg total) by mouth every 6 (six) hours as needed. (Patient not taking: Reported on 06/21/2019) 60 tablet 0    Drug Regimen Review  Drug regimen was reviewed and remains appropriate with no significant issues identified  Home: Home Living Family/patient expects to be discharged to:: Private residence Living Arrangements: Spouse/significant other Available Help at Discharge: Family, Available 24 hours/day Type of Home: House(town ho[place]e) Home Access: Stairs to enter [employer] of Steps: 2 Entrance Stairs-Rails: None Home Layout: Two level Alternate Level Stairs-Number  of Steps: 10 Alternate Level Stairs-Rails: Right, Left Bathroom Shower/Tub: Tub/shower unit Bathroom Toilet: Standard Home Equipment: Environmental consultant - 4 wheels, Wheelchair - manual   Functional History: Prior Function Level of Independence: Independent, Needs assistance Gait /  Transfers Assistance Needed: In the last 3 weeks, pt reports he was having difficulty with mobility and was unable to walk up the stairs. 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  Functional Status:  Mobility: Bed Mobility Overal bed mobility: Needs Assistance Bed Mobility: Rolling, Sidelying to Sit Rolling: Supervision Sidelying to sit: Supervision General bed mobility comments: cues to maintain log roll Transfers Overall transfer level: Needs assistance Equipment used: Rolling walker (2 wheeled) Transfers: Sit to/from Stand Sit to Stand: Min guard Stand pivot transfers: Mod assist General transfer comment: PT provides cues for stand to sit utilizing hands to feel chair and slow descent Ambulation/Gait Ambulation/Gait assistance: Min assist Gait Distance (Feet): 60 Feet(additional trials of 5', 20') Assistive device: Rolling walker (2 wheeled) Gait Pattern/deviations: Step-to pattern, Narrow base of support General Gait Details: pt with increased R knee flexion during stance phase, very narrowed base of support width with intermittent scissoring noted. PT provides cues to widen BOS which improves gait quality. PT also provides cues to roll walker along ground rather than picking it up to advance Gait velocity: reduced Gait velocity interpretation: <1.8 ft/sec, indicate of risk for recurrent falls    ADL: ADL Overall ADL's : Needs assistance/impaired Eating/Feeding: Set up, Sitting Grooming: Set up, Sitting Upper Body Bathing: Min guard, Sitting Lower Body Bathing: Moderate assistance, Sit to/from stand Upper Body Dressing : Min guard, Sitting Lower Body Dressing: Moderate assistance, Sit to/from stand Toilet Transfer: Moderate assistance, Stand-pivot, RW Toilet Transfer Details (indicate cue type and reason): simulated to recliner;max cues for safe use of  RW Toileting- Clothing Manipulation and Hygiene: Moderate assistance, Sit to/from stand Toileting - Clothing Manipulation Details (indicate cue type and reason): from lower surface Functional mobility during ADLs: Moderate assistance, Rolling walker General ADL Comments: pt limited by weakness, decreased activity tolerance, and numbness in RUE  Cognition: Cognition Overall Cognitive Status: Within Functional Limits for tasks assessed Orientation Level: Oriented X4 Cognition Arousal/Alertness: Awake/alert Behavior During Therapy: WFL for tasks assessed/performed Overall Cognitive Status: Within Functional Limits for tasks assessed  Physical Exam: Blood pressure 134/80, pulse (!) 51, temperature 97.6 F (36.4 C), temperature source Oral, resp. rate 13, height 5\' 8"  (1.727 m), weight 78 kg, SpO2 96 %. Physical Exam  Vitals reviewed. Constitutional: He appears well-developed and well-nourished.  HENT:  Head: Normocephalic and atraumatic.  Eyes: EOM are normal. Right eye exhibits no discharge. Left eye exhibits no discharge.  Neck: No tracheal deviation present. No thyromegaly present.  Respiratory: Effort normal. No respiratory distress.  GI: Soft. He exhibits no distension.  Musculoskeletal:     Comments: No edema or tenderness in extremities  Neurological: He is alert.  Makes eye contact with examiner.   Follows simple demonstrated commands. Motor: Bilateral upper extremities: 5/5 proximal distal Left lower extremity: 5/5 proximal distal Right lower extremity: Hip flexion, knee extension 4+/5, ankle dorsiflexion?  0/5  Skin:  Lumbar as well as cervical surgical sites C/D/I  Psychiatric:  Mood, behavior, thought appear to be normal    No results found for this or any previous visit (from the past 48 hour(s)). No results found.     Medical Problem List and Plan: 1.  Gait disturbance  secondary to lumbar cervical stenosis with radiculopathy.S/P decompressive anterior  cervical discectomy C4-5 with arthrodesis and lumbar laminectomy with foraminotomies L3-4 with discectomy 06/25/2019.  No brace required  -patient may not shower  -ELOS/Goals: 7-11 days/supervision/mod I  Admit to CIR  Will consider orthoses for?  Right foot drop 2.  Antithrombotics: -DVT/anticoagulation: SCDs.  Check vascular study  -antiplatelet therapy: N/A 3. Pain Management: Oxycodone and Robaxin as needed  Monitor with increased exertion 4. Mood: Provide emotional support  -antipsychotic agents: N/A 5. Neuropsych: This patient is capable of making decisions on his own behalf. 6. Skin/Wound Care: Routine skin checks 7. Fluids/Electrolytes/Nutrition: Routine in and outs.  CMP ordered. 8.  Urinary retention.  Flomax 0.4 mg daily.  Check PVRs 9.  Hyperlipidemia.  Resume Lipitor 10.  Drug-induced constipation  Adjust bowel meds as necessary  Cathlyn Parsons, PA-C 06/28/2019  I have personally performed a face to face diagnostic evaluation, including, but not limited to relevant history and physical exam findings, of this patient and developed relevant assessment and plan.  Additionally, I have reviewed and concur with the physician assistant's documentation above.  Delice Lesch, MD, ABPMR

## 2019-06-28 NOTE — H&P (Signed)
Physical Medicine and Rehabilitation Admission H&P     HPI: Alan Riley is a 74 year old right-handed non-English-speaking male from [place].  History taken from chart review due to language.  Past medical history of hyperlipidemia as well as back surgery 30 years ago.  Patient lives with spouse.  Two-level home 2 steps to entry.  Patient was independent up until approximately 3 weeks ago with noted decreased functional mobility and unable to complete ADLs.  He presented on 06/25/2019 with gait abnormality, neck pain, dysesthesias in upper extremities and weakness in lower extremities times several weeks.  Work-up and imaging revealed severe cervical spinal stenosis C4-5 as well as severe lumbar stenosis L3-4 disc herniation.  He underwent decompressive anterior cervical discectomy C4-5 anterior cervical arthrodesis C4-5 with anterior cervical plating as well as decompressive lumbar laminectomy medial for foraminotomies L3-4 bilaterally with discectomy on 06/25/2019 per Dr. Sherley Bounds.  No brace needed.  Decadron protocol as indicated.  Hospital course complicated by bouts of urinary retention and indwelling Foley was placed.  He is also had constipation.  Therapy evaluation completed with recommendations of physical medicine rehab consult.  Please see preadmission assessment from earlier today as well.  Review of Systems  Unable to perform ROS: Language   Past Medical History:  Diagnosis Date  . Basal cell carcinoma of skin 2019   scalp  . HLD (hyperlipidemia)   . Hx of adenomatous colonic polyps   . Iron deficiency anemia secondary to blood loss (chronic)   . Malignant neoplasm of ascending colon ([place]) 2004  . Partial small bowel obstruction ([place]) 12/29/2013  . Uses wheelchair    Past Surgical History:  Procedure Laterality Date  . ANTERIOR CERVICAL DECOMP/DISCECTOMY FUSION N/A 06/25/2019   Procedure: C4-5 ACDF;  Surgeon: [name], MD;  Location: [facility];  Service: Neurosurgery;   Laterality: N/A;  . BACK SURGERY     over 30 yrs ago   . CHOLECYSTECTOMY  1991  . COLONOSCOPY  2013, 2016   x 2  . HEMICOLECTOMY  01/15/03  . LUMBAR LAMINECTOMY/DECOMPRESSION MICRODISCECTOMY Bilateral 06/25/2019   Procedure: L3-4 LUMBAR LAMINECTOMY/DECOMPRESSION MICRODISCECTOMY;  Surgeon: [name], MD;  Location: [facility];  Service: Neurosurgery;  Laterality: Bilateral;   Family History  Problem Relation Age of Onset  . Diabetes Mother   . Diabetes Brother    Social History:  reports that he has never smoked. He has never used smokeless tobacco. He reports previous alcohol use. He reports that he does not use drugs. Allergies: No Known Allergies Medications Prior to Admission  Medication Sig Dispense Refill  . acetaminophen (TYLENOL) 500 MG tablet Take 500-1,000 mg by mouth every 6 (six) hours as needed for mild pain or moderate pain.    Marland Kitchen atorvastatin (LIPITOR) 20 MG tablet Take 20 mg by mouth at bedtime.     . Cholecalciferol (VITAMIN D PO) Take 25 mcg by mouth daily.     Marland Kitchen acetaminophen (TYLENOL) 325 MG tablet Take 2 tablets (650 mg total) by mouth every 6 (six) hours as needed. (Patient not taking: Reported on 06/21/2019) 60 tablet 0    Drug Regimen Review  Drug regimen was reviewed and remains appropriate with no significant issues identified  Home: Home Living Family/patient expects to be discharged to:: Private residence Living Arrangements: Spouse/significant other Available Help at Discharge: Family, Available 24 hours/day Type of Home: House(town [place]) Home Access: Stairs to enter [employer] of Steps: 2 Entrance Stairs-Rails: None Home Layout: Two level Alternate Level Stairs-Number  of Steps: 10 Alternate Level Stairs-Rails: Right, Left Bathroom Shower/Tub: Tub/shower unit Bathroom Toilet: Standard Home Equipment: Environmental consultant - 4 wheels, Wheelchair - manual   Functional History: Prior Function Level of Independence: Independent, Needs assistance Gait /  Transfers Assistance Needed: In the last 3 weeks, pt reports he was having difficulty with mobility and was unable to walk up the stairs. 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  Functional Status:  Mobility: Bed Mobility Overal bed mobility: Needs Assistance Bed Mobility: Rolling, Sidelying to Sit Rolling: Supervision Sidelying to sit: Supervision General bed mobility comments: cues to maintain log roll Transfers Overall transfer level: Needs assistance Equipment used: Rolling walker (2 wheeled) Transfers: Sit to/from Stand Sit to Stand: Min guard Stand pivot transfers: Mod assist General transfer comment: PT provides cues for stand to sit utilizing hands to feel chair and slow descent Ambulation/Gait Ambulation/Gait assistance: Min assist Gait Distance (Feet): 60 Feet(additional trials of 5', 20') Assistive device: Rolling walker (2 wheeled) Gait Pattern/deviations: Step-to pattern, Narrow base of support General Gait Details: pt with increased R knee flexion during stance phase, very narrowed base of support width with intermittent scissoring noted. PT provides cues to widen BOS which improves gait quality. PT also provides cues to roll walker along ground rather than picking it up to advance Gait velocity: reduced Gait velocity interpretation: <1.8 ft/sec, indicate of risk for recurrent falls    ADL: ADL Overall ADL's : Needs assistance/impaired Eating/Feeding: Set up, Sitting Grooming: Set up, Sitting Upper Body Bathing: Min guard, Sitting Lower Body Bathing: Moderate assistance, Sit to/from stand Upper Body Dressing : Min guard, Sitting Lower Body Dressing: Moderate assistance, Sit to/from stand Toilet Transfer: Moderate assistance, Stand-pivot, RW Toilet Transfer Details (indicate cue type and reason): simulated to recliner;max cues for safe use of  RW Toileting- Clothing Manipulation and Hygiene: Moderate assistance, Sit to/from stand Toileting - Clothing Manipulation Details (indicate cue type and reason): from lower surface Functional mobility during ADLs: Moderate assistance, Rolling walker General ADL Comments: pt limited by weakness, decreased activity tolerance, and numbness in RUE  Cognition: Cognition Overall Cognitive Status: Within Functional Limits for tasks assessed Orientation Level: Oriented X4 Cognition Arousal/Alertness: Awake/alert Behavior During Therapy: WFL for tasks assessed/performed Overall Cognitive Status: Within Functional Limits for tasks assessed  Physical Exam: Blood pressure 134/80, pulse (!) 51, temperature 97.6 F (36.4 C), temperature source Oral, resp. rate 13, height 5\' 8"  (1.727 m), weight 78 kg, SpO2 96 %. Physical Exam  Vitals reviewed. Constitutional: He appears well-developed and well-nourished.  HENT:  Head: Normocephalic and atraumatic.  Eyes: EOM are normal. Right eye exhibits no discharge. Left eye exhibits no discharge.  Neck: No tracheal deviation present. No thyromegaly present.  Respiratory: Effort normal. No respiratory distress.  GI: Soft. He exhibits no distension.  Musculoskeletal:     Comments: No edema or tenderness in extremities  Neurological: He is alert.  Makes eye contact with examiner.   Follows simple demonstrated commands. Motor: Bilateral upper extremities: 5/5 proximal distal Left lower extremity: 5/5 proximal distal Right lower extremity: Hip flexion, knee extension 4+/5, ankle dorsiflexion?  0/5  Skin:  Lumbar as well as cervical surgical sites C/D/I  Psychiatric:  Mood, behavior, thought appear to be normal    No results found for this or any previous visit (from the past 48 hour(s)). No results found.     Medical Problem List and Plan: 1.  Gait disturbance  secondary to lumbar cervical stenosis with radiculopathy.S/P decompressive anterior  cervical discectomy C4-5 with arthrodesis and lumbar laminectomy with foraminotomies L3-4 with discectomy 06/25/2019.  No brace required  -patient may not shower  -ELOS/Goals: 7-11 days/supervision/mod I  Admit to CIR  Will consider orthoses for?  Right foot drop 2.  Antithrombotics: -DVT/anticoagulation: SCDs.  Check vascular study  -antiplatelet therapy: N/A 3. Pain Management: Oxycodone and Robaxin as needed  Monitor with increased exertion 4. Mood: Provide emotional support  -antipsychotic agents: N/A 5. Neuropsych: This patient is capable of making decisions on his own behalf. 6. Skin/Wound Care: Routine skin checks 7. Fluids/Electrolytes/Nutrition: Routine in and outs.  CMP ordered. 8.  Urinary retention.  Flomax 0.4 mg daily.  Check PVRs 9.  Hyperlipidemia.  Resume Lipitor 10.  Drug-induced constipation  Adjust bowel meds as necessary  Cathlyn Parsons, PA-C 06/28/2019  I have personally performed a face to face diagnostic evaluation, including, but not limited to relevant history and physical exam findings, of this patient and developed relevant assessment and plan.  Additionally, I have reviewed and concur with the physician assistant's documentation above.  Delice Lesch, MD, ABPMR  The patient's status has not changed. The original post admission physician evaluation remains appropriate, and any changes from the pre-admission screening or documentation from the acute chart are noted above.   Delice Lesch, MD, ABPMR

## 2019-06-28 NOTE — H&P (Deleted)
Physical Medicine and Rehabilitation Admission H&P     HPI: Alan Riley is a 74 year old right-handed non-English-speaking male from [place].  History taken from chart review due to language.  Past medical history of hyperlipidemia as well as back surgery 30 years ago.  Patient lives with spouse.  Two-level home 2 steps to entry.  Patient was independent up until approximately 3 weeks ago with noted decreased functional mobility and unable to complete ADLs.  He presented on 06/25/2019 with gait abnormality, neck pain, dysesthesias in upper extremities and weakness in lower extremities times several weeks.  Work-up and imaging revealed severe cervical spinal stenosis C4-5 as well as severe lumbar stenosis L3-4 disc herniation.  He underwent decompressive anterior cervical discectomy C4-5 anterior cervical arthrodesis C4-5 with anterior cervical plating as well as decompressive lumbar laminectomy medial for foraminotomies L3-4 bilaterally with discectomy on 06/25/2019 per Dr. Sherley Bounds.  No brace needed.  Decadron protocol as indicated.  Hospital course complicated by bouts of urinary retention and indwelling Foley was placed.  He is also had constipation.  Therapy evaluation completed with recommendations of physical medicine rehab consult.  Please see preadmission assessment from earlier today as well.  Review of Systems  Unable to perform ROS: Language   Past Medical History:  Diagnosis Date  . Basal cell carcinoma of skin 2019   scalp  . HLD (hyperlipidemia)   . Hx of adenomatous colonic polyps   . Iron deficiency anemia secondary to blood loss (chronic)   . Malignant neoplasm of ascending colon ([place]) 2004  . Partial small bowel obstruction ([place]) 12/29/2013  . Uses wheelchair    Past Surgical History:  Procedure Laterality Date  . ANTERIOR CERVICAL DECOMP/DISCECTOMY FUSION N/A 06/25/2019   Procedure: C4-5 ACDF;  Surgeon: [name], MD;  Location: [facility];  Service: Neurosurgery;   Laterality: N/A;  . BACK SURGERY     over 30 yrs ago   . CHOLECYSTECTOMY  1991  . COLONOSCOPY  2013, 2016   x 2  . HEMICOLECTOMY  01/15/03  . LUMBAR LAMINECTOMY/DECOMPRESSION MICRODISCECTOMY Bilateral 06/25/2019   Procedure: L3-4 LUMBAR LAMINECTOMY/DECOMPRESSION MICRODISCECTOMY;  Surgeon: [name], MD;  Location: [facility];  Service: Neurosurgery;  Laterality: Bilateral;   Family History  Problem Relation Age of Onset  . Diabetes Mother   . Diabetes Brother    Social History:  reports that he has never smoked. He has never used smokeless tobacco. He reports previous alcohol use. He reports that he does not use drugs. Allergies: No Known Allergies Medications Prior to Admission  Medication Sig Dispense Refill  . acetaminophen (TYLENOL) 500 MG tablet Take 500-1,000 mg by mouth every 6 (six) hours as needed for mild pain or moderate pain.    Marland Kitchen atorvastatin (LIPITOR) 20 MG tablet Take 20 mg by mouth at bedtime.     . Cholecalciferol (VITAMIN D PO) Take 25 mcg by mouth daily.     Marland Kitchen acetaminophen (TYLENOL) 325 MG tablet Take 2 tablets (650 mg total) by mouth every 6 (six) hours as needed. (Patient not taking: Reported on 06/21/2019) 60 tablet 0    Drug Regimen Review  Drug regimen was reviewed and remains appropriate with no significant issues identified  Home: Home Living Family/patient expects to be discharged to:: Private residence Living Arrangements: Spouse/significant other Available Help at Discharge: Family, Available 24 hours/day Type of Home: House(town [place]) Home Access: Stairs to enter [employer] of Steps: 2 Entrance Stairs-Rails: None Home Layout: Two level Alternate Level Stairs-Number  of Steps: 10 Alternate Level Stairs-Rails: Right, Left Bathroom Shower/Tub: Tub/shower unit Bathroom Toilet: Standard Home Equipment: Environmental consultant - 4 wheels, Wheelchair - manual   Functional History: Prior Function Level of Independence: Independent, Needs assistance Gait /  Transfers Assistance Needed: In the last 3 weeks, pt reports he was having difficulty with mobility and was unable to walk up the stairs. 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  Functional Status:  Mobility: Bed Mobility Overal bed mobility: Needs Assistance Bed Mobility: Rolling, Sidelying to Sit Rolling: Supervision Sidelying to sit: Supervision General bed mobility comments: cues to maintain log roll Transfers Overall transfer level: Needs assistance Equipment used: Rolling walker (2 wheeled) Transfers: Sit to/from Stand Sit to Stand: Min guard Stand pivot transfers: Mod assist General transfer comment: PT provides cues for stand to sit utilizing hands to feel chair and slow descent Ambulation/Gait Ambulation/Gait assistance: Min assist Gait Distance (Feet): 60 Feet(additional trials of 5', 20') Assistive device: Rolling walker (2 wheeled) Gait Pattern/deviations: Step-to pattern, Narrow base of support General Gait Details: pt with increased R knee flexion during stance phase, very narrowed base of support width with intermittent scissoring noted. PT provides cues to widen BOS which improves gait quality. PT also provides cues to roll walker along ground rather than picking it up to advance Gait velocity: reduced Gait velocity interpretation: <1.8 ft/sec, indicate of risk for recurrent falls    ADL: ADL Overall ADL's : Needs assistance/impaired Eating/Feeding: Set up, Sitting Grooming: Set up, Sitting Upper Body Bathing: Min guard, Sitting Lower Body Bathing: Moderate assistance, Sit to/from stand Upper Body Dressing : Min guard, Sitting Lower Body Dressing: Moderate assistance, Sit to/from stand Toilet Transfer: Moderate assistance, Stand-pivot, RW Toilet Transfer Details (indicate cue type and reason): simulated to recliner;max cues for safe use of  RW Toileting- Clothing Manipulation and Hygiene: Moderate assistance, Sit to/from stand Toileting - Clothing Manipulation Details (indicate cue type and reason): from lower surface Functional mobility during ADLs: Moderate assistance, Rolling walker General ADL Comments: pt limited by weakness, decreased activity tolerance, and numbness in RUE  Cognition: Cognition Overall Cognitive Status: Within Functional Limits for tasks assessed Orientation Level: Oriented X4 Cognition Arousal/Alertness: Awake/alert Behavior During Therapy: WFL for tasks assessed/performed Overall Cognitive Status: Within Functional Limits for tasks assessed  Physical Exam: Blood pressure 134/80, pulse (!) 51, temperature 97.6 F (36.4 C), temperature source Oral, resp. rate 13, height 5\' 8"  (1.727 m), weight 78 kg, SpO2 96 %. Physical Exam  Vitals reviewed. Constitutional: He appears well-developed and well-nourished.  HENT:  Head: Normocephalic and atraumatic.  Eyes: EOM are normal. Right eye exhibits no discharge. Left eye exhibits no discharge.  Neck: No tracheal deviation present. No thyromegaly present.  Respiratory: Effort normal. No respiratory distress.  GI: Soft. He exhibits no distension.  Musculoskeletal:     Comments: No edema or tenderness in extremities  Neurological: He is alert.  Makes eye contact with examiner.   Follows simple demonstrated commands. Motor: Bilateral upper extremities: 5/5 proximal distal Left lower extremity: 5/5 proximal distal Right lower extremity: Hip flexion, knee extension 4+/5, ankle dorsiflexion?  0/5  Skin:  Lumbar as well as cervical surgical sites C/D/I  Psychiatric:  Mood, behavior, thought appear to be normal    No results found for this or any previous visit (from the past 48 hour(s)). No results found.     Medical Problem List and Plan: 1.  Gait disturbance  secondary to lumbar cervical stenosis with radiculopathy.S/P decompressive anterior  cervical discectomy C4-5 with arthrodesis and lumbar laminectomy with foraminotomies L3-4 with discectomy 06/25/2019.  No brace required  -patient may not shower  -ELOS/Goals: 7-11 days/supervision/mod I  Admit to CIR  Will consider orthoses for?  Right foot drop 2.  Antithrombotics: -DVT/anticoagulation: SCDs.  Check vascular study  -antiplatelet therapy: N/A 3. Pain Management: Oxycodone and Robaxin as needed  Monitor with increased exertion 4. Mood: Provide emotional support  -antipsychotic agents: N/A 5. Neuropsych: This patient is capable of making decisions on his own behalf. 6. Skin/Wound Care: Routine skin checks 7. Fluids/Electrolytes/Nutrition: Routine in and outs.  CMP ordered. 8.  Urinary retention.  Flomax 0.4 mg daily.  Check PVRs 9.  Hyperlipidemia.  Resume Lipitor 10.  Drug-induced constipation  Adjust bowel meds as necessary  Cathlyn Parsons, PA-C 06/28/2019  I have personally performed a face to face diagnostic evaluation, including, but not limited to relevant history and physical exam findings, of this patient and developed relevant assessment and plan.  Additionally, I have reviewed and concur with the physician assistant's documentation above.  Delice Lesch, MD, ABPMR  The patient's status has not changed. The original post admission physician evaluation remains appropriate, and any changes from the pre-admission screening or documentation from the acute chart are noted above.   Delice Lesch, MD, ABPMR

## 2019-06-28 NOTE — Discharge Summary (Signed)
Physician Discharge Summary  Patient ID: Alan Riley MRN: [id number] DOB/AGE: [birth year] 74 y.o.  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 The hospital course was routine. There were no complications. The wound remained clean dry and intact. Pt had appropriate  Neck and back soreness. No complaints of  Arm or leg pain or new N/T/W. The patient remained afebrile with stable vital signs, and tolerated a regular diet. The patient continued to increase activities, and pain was well controlled with oral pain medications.   Consults: rehabilitation medicine  Significant Diagnostic Studies:  Results for orders placed or performed during the hospital encounter of 06/25/19  Surgical pcr screen   Specimen: Nasal Mucosa; Nasal Swab  Result Value Ref Range   MRSA, PCR NEGATIVE NEGATIVE   Staphylococcus aureus NEGATIVE NEGATIVE  CBC  Result Value Ref Range   WBC 8.4 4.0 - 10.5 K/uL   RBC 4.66 4.22 - 5.81 MIL/uL   Hemoglobin 15.1 13.0 - 17.0 g/dL   HCT 45.3 39.0 - 52.0 %   MCV 97.2 80.0 - 100.0 fL   MCH 32.4 26.0 - 34.0 pg   MCHC 33.3 30.0 - 36.0 g/dL   RDW 12.7 11.5 - 15.5 %   Platelets 168 150 - 400  K/uL   nRBC 0.0 0.0 - 0.2 %  Basic metabolic panel  Result Value Ref Range   Sodium 140 135 - 145 mmol/L   Potassium 3.9 3.5 - 5.1 mmol/L   Chloride 104 98 - 111 mmol/L   CO2 25 22 - 32 mmol/L   Glucose, Bld 97 70 - 99 mg/dL   BUN 9 8 - 23 mg/dL   Creatinine, Ser 0.61 0.61 - 1.24 mg/dL   Calcium 9.0 8.9 - 10.3 mg/dL   GFR calc non Af Amer >60 >60 mL/min   GFR calc Af Amer >60 >60 mL/min   Anion gap 11 5 - 15    DG Cervical Spine 2-3 Views  Result Date: 06/25/2019 CLINICAL DATA:  Cervical fusion EXAM: CERVICAL SPINE - 2-3 VIEW COMPARISON:  MRI from 06/18/2019 FLUOROSCOPY TIME:  Radiation Exposure Index (as provided by the fluoroscopic device): 4.56 mGy If the device does not provide the exposure index: Fluoroscopy Time:  12 seconds Number of Acquired Images:  1 FINDINGS: Postsurgical changes are noted at C4-5 with interbody fusion and anterior fixation. Numbering nomenclature is similar to that utilized on prior MRI. IMPRESSION: C4-5 fusion. Electronically Signed   By: Inez Catalina M.D.   On: 06/25/2019 21:17   DG Lumbar Spine 2-3 Views  Result Date: 06/25/2019 CLINICAL DATA:  Cervical fusion and lumbar laminectomy EXAM: DG C-ARM 1-60 MIN; LUMBAR SPINE - 2-3 VIEW COMPARISON:  06/20/2019 FLUOROSCOPY TIME:  Fluoroscopy Time:  6 seconds Radiation Exposure Index (if provided by  the fluoroscopic device): 4.15 mGy Number of Acquired Spot Images: 1 FINDINGS: Mild anterolisthesis is again noted of L4 on L5. surgical instruments are noted just posterior to the L3-4 interspace. IMPRESSION: Intraoperative localization at L3-4. Electronically Signed   By: Inez Catalina M.D.   On: 06/25/2019 21:16   MR CERVICAL SPINE W WO CONTRAST  Result Date: 06/18/2019 CLINICAL DATA:  Benign neoplasm of spinal cord with prior resection. Right arm and hand weakness. History of colon cancer. EXAM: MRI CERVICAL SPINE WITHOUT AND WITH CONTRAST TECHNIQUE: Multiplanar and multiecho pulse sequences of the cervical spine, to  include the craniocervical junction and cervicothoracic junction, were obtained without and with intravenous contrast. CONTRAST:  14mL MULTIHANCE GADOBENATE DIMEGLUMINE 529 MG/ML IV SOLN COMPARISON:  Cervical spine radiographs 06/04/2019 FINDINGS: Alignment: Normal alignment with straightening of the cervical lordosis The patient was not able to hold still and there is significant image degradation due to motion. Vertebrae: Negative for fracture or mass. Normal enhancement of the spine. Patchy bone marrow pattern with a benign appearance. Cord: Cord compression at C4-5 without abnormal signal Cord atrophy and extensive cord hyperintensity at C5 and C6 bilaterally. No significant spinal stenosis at this level with prior laminectomy. Posterior Fossa, vertebral arteries, paraspinal tissues: Negative for mass or adenopathy in the neck. Normal flow voids in the vertebral arteries. Posterior fossa negative. Disc levels: C2-3: Small central disc protrusion and bilateral facet degeneration. Negative for stenosis C3-4: Disc degeneration and diffuse uncinate spurring. Moderate facet hypertrophy bilaterally. Moderate spinal stenosis and moderate foraminal stenosis bilaterally C4-5: Broad-based disc protrusion. Moderate facet hypertrophy. Severe spinal stenosis with compression of the cord and moderate foraminal encroachment bilaterally due to spurring. C5-6: Posterior decompression. Disc degeneration with diffuse uncinate spurring. Moderate foraminal stenosis bilaterally due to spurring. Spinal canal adequately decompressed. C6-7: Posterior laminectomy and decompression of the canal. Disc degeneration and spurring with moderate foraminal encroachment bilaterally due to spurring C7-T1: Disc degeneration and uncinate spurring. Bilateral facet degeneration. Moderate foraminal stenosis bilaterally. IMPRESSION: 1. Negative for fracture or mass in the cervical spine. Negative for metastatic disease 2. Severe multilevel degenerative  change throughout the cervical spine as above. Multilevel spinal and foraminal encroachment due to spurring 3. Severe spinal stenosis C4-5 with cord compression and moderate foraminal encroachment bilaterally 4. Posterior decompression at C5 and C6. Chronic myelomalacia in the spinal cord at this level. No recurrent tumor. 5. These results were called by telephone at the time of interpretation on 06/18/2019 at 8:38 am to provider [facility] , who verbally acknowledged these results. Electronically Signed   By: Franchot Gallo M.D.   On: 06/18/2019 08:38   MR THORACIC SPINE W WO CONTRAST  Result Date: 06/20/2019 CLINICAL DATA:  Myelopathy. Remote history of prior resection of a benign neoplasm of the spinal cord. Right arm and hand weakness. History of colon cancer. EXAM: MRI THORACIC WITHOUT AND WITH CONTRAST TECHNIQUE: Multiplanar and multiecho pulse sequences of the thoracic spine were obtained without and with intravenous contrast. CONTRAST:  7.40mL GADAVIST GADOBUTROL 1 MMOL/ML IV SOLN COMPARISON:  None. FINDINGS: MRI THORACIC SPINE FINDINGS Alignment:  Physiologic. Vertebrae: No fracture, evidence of discitis, or bone lesion. Cord: No myelopathy. Slight indentation upon the ventral aspect of the spinal cord at T4-5 and T5-6 without discrete myelopathy. No spinal or significant foraminal stenoses. Paraspinal and other soft tissues: Negative. Disc levels: T1-2: Negative. T2-3: Small broad-based disc bulge with no neural impingement. T3-4: Normal. T4-5: Small central subligamentous disc protrusion slightly compressing the ventral aspect of the spinal  cord without myelopathy. T5-6: Small central subligamentous disc protrusion slightly compressing the ventral aspect the spinal cord without myelopathy. T6-7 through T12-L1: Negative. IMPRESSION: 1. Small central subligamentous disc protrusions at T4-5 and T5-6 slightly compressing the ventral aspect of the spinal cord without myelopathy. 2. No other significant  abnormalities. Electronically Signed   By: Lorriane Shire M.D.   On: 06/20/2019 16:35   MR Lumbar Spine W Wo Contrast  Result Date: 06/20/2019 CLINICAL DATA:  Myelopathy.  Patient is unable to walk. EXAM: MRI LUMBAR SPINE WITHOUT AND WITH CONTRAST TECHNIQUE: Multiplanar and multiecho pulse sequences of the lumbar spine were obtained without and with intravenous contrast. CONTRAST:  7.60mL GADAVIST GADOBUTROL 1 MMOL/ML IV SOLN COMPARISON:  CT scan of the abdomen and pelvis dated 08/05/2014 FINDINGS: Segmentation:  5 lumbar type vertebra. Alignment:  7 mm spondylolisthesis of L4 on L5.  Otherwise normal. Vertebrae: No fracture, evidence of discitis, or significant bone lesion. Benign hemangiomata in the L2 and L5 vertebral bodies. Conus medullaris: Conus extends to the L1-2 level. Conus appears normal. Paraspinal and other soft tissues: Negative. Disc levels: T12-L1: Normal. L1-2: Normal disc. Slight hypertrophy of the ligamentum flavum and facet joints without neural impingement. L2-3: Slight disc desiccation. Small disc bulges to the right and left of midline with accompanying osteophytes. Slight hypertrophy of the ligamentum flavum and facet joints create mild spinal stenosis and slight compression of the left lateral recess visible on image 19 of series 8. L3-4: There is a small broad-based disc protrusion asymmetric to the right but also extending into the left lateral recess and left neural foramen. There is a prominent overlying epidural hematoma best seen on image 23 of series 9 which severely compresses the thecal sac. Slight hypertrophy of the ligamentum flavum contributes to the compression of the thecal sac. L4-5: 7 mm spondylolisthesis due to severe bilateral facet arthritis. Combine with hypertrophy of the ligamentum flavum this creates moderately severe spinal stenosis and narrowing of both lateral recesses. Moderate right foraminal stenosis. L5-S1: Chronic disc space narrowing. Small broad-based  disc bulge with accompanying osteophytes without neural impingement. No foraminal stenosis. No facet arthritis. IMPRESSION: 1. Severe compression of the thecal sac at L3-4 due to a combination of a broad-based disc protrusion, epidural hematoma anterior to the thecal sac likely due to the disc protrusion and hypertrophy of the ligamentum flavum. 2. Moderately severe spinal stenosis at L4-5 due to a combination of hypertrophy of the ligamentum flavum and facet joints and a 7 mm spondylolisthesis. 3. Mild spinal stenosis at L2-3 with slight compression of the left lateral recess. Electronically Signed   By: Lorriane Shire M.D.   On: 06/20/2019 16:58   DG C-Arm 1-60 Min  Result Date: 06/25/2019 CLINICAL DATA:  Cervical fusion and lumbar laminectomy EXAM: DG C-ARM 1-60 MIN; LUMBAR SPINE - 2-3 VIEW COMPARISON:  06/20/2019 FLUOROSCOPY TIME:  Fluoroscopy Time:  6 seconds Radiation Exposure Index (if provided by the fluoroscopic device): 4.15 mGy Number of Acquired Spot Images: 1 FINDINGS: Mild anterolisthesis is again noted of L4 on L5. surgical instruments are noted just posterior to the L3-4 interspace. IMPRESSION: Intraoperative localization at L3-4. Electronically Signed   By: Inez Catalina M.D.   On: 06/25/2019 21:16    Antibiotics:  Anti-infectives (From admission, onward)   Start     Dose/Rate Route Frequency Ordered Stop   06/26/19 0015  ceFAZolin (ANCEF) IVPB 2g/100 mL premix     2 g 200 mL/hr over 30 Minutes Intravenous Every 8 hours 06/25/19 2329  06/26/19 0922   06/25/19 1704  bacitracin 50,000 Units in sodium chloride 0.9 % 500 mL irrigation  Status:  Discontinued       As needed 06/25/19 1704 06/25/19 2014      Discharge Exam: Blood pressure (!) 141/62, pulse 63, temperature 97.6 F (36.4 C), temperature source Oral, resp. rate 12, height 5\' 8"  (1.727 m), weight 78 kg, SpO2 93 %. Neurologic: Grossly normal with stable right lower extremity spasticity and hyperreflexia  incisions clean dry  and intact  Discharge Medications:   Allergies as of 06/28/2019   No Known Allergies     Medication List    TAKE these medications   acetaminophen 500 MG tablet Commonly known as: TYLENOL Take 500-1,000 mg by mouth every 6 (six) hours as needed for mild pain or moderate pain.   acetaminophen 325 MG tablet Commonly known as: TYLENOL Take 2 tablets (650 mg total) by mouth every 6 (six) hours as needed.   atorvastatin 20 MG tablet Commonly known as: LIPITOR Take 20 mg by mouth at bedtime.   oxyCODONE 5 MG immediate release tablet Commonly known as: Oxy IR/ROXICODONE Take 1 tablet (5 mg total) by mouth every 6 (six) hours as needed for moderate pain ((score 4 to 6)).   VITAMIN D PO Take 25 mcg by mouth daily.            Durable Medical Equipment  (From admission, onward)         Start     Ordered   06/25/19 2330  DME Walker rolling  Once    Question Answer Comment  Walker: With [place]   Patient needs a walker to treat with the following condition Gait instability      06/25/19 2329          Disposition:  CIR   Final Dx:  Status post ACDF C4-5 and decompressive laminectomy and diskectomy L3-4  Discharge Instructions    Call MD for:  redness, tenderness, or signs of infection (pain, swelling, redness, odor or green/yellow discharge around incision site)   Complete by: As directed    Call MD for:  severe uncontrolled pain   Complete by: As directed    Call MD for:  temperature >100.4   Complete by: As directed    Diet - low sodium heart healthy   Complete by: As directed    Increase activity slowly   Complete by: As directed    Remove dressing in 24 hours   Complete by: As directed       Follow-up Information    [name], MD. Schedule an appointment as soon as possible for a visit in 2 week(s).   Specialty: Neurosurgery Contact information: [address] [place] [address] [phone]             Signed: [name] 06/28/2019, 3:31 PM

## 2019-06-28 NOTE — TOC Initial Note (Signed)
Transition of Care [facility]) - Initial/Assessment Note    Patient Details  Name: Alan Riley MRN: [id number] Date of Birth: Dec 13, 1945  Transition of Care [facility]) CM/SW Contact:    Verdell Carmine, RN Phone Number: 06/28/2019, 11:09 AM  Clinical Narrative:                  Patient presented with numbness in arms, weakness, back pain patient underwent Anterior cervical discectomy C4-5 and a Lumbar Laminectomy L3-4 with discectomy. POD # 2  Plan is to bridge to home with CIR as recommended by PT/OT.  CIR MD will  Be consulted when patient medically ready.   Expected Discharge Plan: IP Rehab Facility Barriers to Discharge: Continued Medical Work up   Patient Goals and CMS Choice  CIR when medically ready If not approved then alternative would beSNF rehab Expected Discharge Plan: [place]   Discharge Planning Services: CM Consult                                          Prior Living Arrangements/Services   Lives with:: Spouse                   Activities of Daily Living Home Assistive Devices/Equipment: Eyeglasses, Dentures (specify type), Wheelchair, Hand-held shower hose, Shower chair with back ADL Screening (condition at time of admission) Patient's cognitive ability adequate to safely complete daily activities?: Yes Is the patient deaf or have difficulty hearing?: No Does the patient have difficulty seeing, even when wearing glasses/contacts?: No Does the patient have difficulty concentrating, remembering, or making decisions?: No Patient able to express need for assistance with ADLs?: Yes Does the patient have difficulty dressing or bathing?: Yes Independently performs ADLs?: No Communication: Independent Dressing (OT): Needs assistance Is this a change from baseline?: Pre-admission baseline Grooming: Independent Feeding: Independent Bathing: Needs assistance Is this a change from baseline?: Pre-admission baseline Toileting: Needs  assistance Is this a change from baseline?: Pre-admission baseline In/Out Bed: Needs assistance Is this a change from baseline?: Pre-admission baseline Walks in Home: (S) Needs assistance(uses wheelchair) Is this a change from baseline?: Pre-admission baseline Does the patient have difficulty walking or climbing stairs?: Yes Weakness of Legs: Both Weakness of Arms/Hands: None  Permission Sought/Granted                  Emotional Assessment   Attitude/Demeanor/Rapport: Gracious   Orientation: : Oriented to Self, Oriented to Place, Oriented to  Time, Oriented to Situation   Psych Involvement: No (comment)  Admission diagnosis:  Cervical herniated disc [M50.20] S/P cervical spinal fusion [Z98.1] Patient Active Problem List   Diagnosis Date Noted  . Cervical herniated disc 06/25/2019  . S/P cervical spinal fusion 06/25/2019  . Memory difficulties 02/23/2016  . Right shoulder pain 10/05/2015  . Prediabetes 04/17/2015  . Subclinical hypothyroidism 12/22/2014  . Healthcare maintenance 12/22/2014  . Pure hypercholesterolemia 11/29/2013  . Basal cell carcinoma of brow 11/29/2013  . History of colon cancer, stage III 04/14/2011   PCP:  Janie Morning, DO Pharmacy:   [id number] - [address], [address] [place] [address] Phone: [phone] Fax: [phone]     Social Determinants of Health (SDOH) Interventions    Readmission Risk Interventions No flowsheet data found.

## 2019-06-28 NOTE — Progress Notes (Signed)
Pt expressed concern that he has not had bm in 3 days. Pts family later said that he has had trouble moving his bowels during hospital stays. RN explained to pt and family that this can happen with pt's decrease in movement and intake. Pt already receiving Sennokot. [facility] office was called and receptionist gave message to Dr. Ronnald Ramp for request for additional stool softeners. See new orders.  Report given this am that pt was to go to MRI today. No orders for MRI in Epic. RN called MRI to confirm from their end and he was not on their list. Dr. Ronnald Ramp made aware of need for order if MRI is indicated. He clarified that he did not order one.   Justice Rocher, RN

## 2019-06-28 NOTE — Progress Notes (Signed)
Patient ID: Alan Riley, male   DOB: 05/22/1945, 74 y.o.   MRN: [id number]   He looks really good today.  He denies pain.  His incisions are clean dry and intact.  He is moving everything well.  I have read his therapy notes.  He seems to be progressing nicely.  Awaiting CIR.

## 2019-06-28 NOTE — Progress Notes (Signed)
Jamse Arn, MD  Physician  Physical Medicine and Rehabilitation  PMR Pre-admission     Signed  Date of Service:  06/27/2019 12:03 PM      Related encounter: Admission (Discharged) from 06/25/2019 in [place]        PMR Admission Coordinator Pre-Admission Assessment   Patient: Alan Riley is an 74 y.o., male MRN: [id number] DOB: May 04, 1945 Height: 5' 8" (172.7 cm) Weight: 78 kg   Insurance Information HMO: yes    PPO:      PCP:      IPA:      80/20:      OTHER:  PRIMARY: [employer]      Policy#: [id number]      Subscriber: patient CM Name: Lattie Haw     Phone#: [phone]     Fax#: [phone] Pre-Cert#: [id number]      Employer:  Josem Kaufmann provided by Lattie Haw for admit to CIR on 06/28/19. Weekly updates are due to Vara Guardian (p): [phone] [id number] (f): [phone] Benefits:  Phone #: [phone]     Name: online at [email or web address], transaction ID #[id number] Eff. Date: 01/25/2019-01/24/2020     Deduct: does not have for in-network providers      Out of Pocket Max: $3,900 ($0 met)      Life Max: NA CIR: $295/day co-pay for days 1-6, $0/day co-pay for days 7-90.       SNF: $0/day co-pay for days 1-20, $184/day co-pay for days 21-100; limited to 100 days/cal yr Outpatient: $10-$40/visit co-pay pending service; visits limited by medical necessity  Home Health: 100% coverage, 0% co-insurance; limited by medical necessity     DME: 80% coverage     Co-Pay: 20% co-insurance Providers:  SECONDARY: None      Policy#:      Phone#:    Development worker, community:       Phone#:    The Engineer, petroleum" for patients in Inpatient Rehabilitation Facilities with attached "Privacy Act [place] Records" was provided and verbally reviewed with: Patient and Family   Emergency Contact Information         Contact Information     Name Relation Home Work [place] Daughter     [phone]    Shamir, Tuzzolino     [phone]    kristoffer, bala [phone]   [phone]         Current Medical History  Patient Admitting Diagnosis: Severe cervical spinal senosis at C4-5, severe lumbar spinal stenosis L3-4 with disc herniation, and gait disturbance; S/p surgical decompression    History of Present Illness: Pt is a 55 you Male admitted to [facility] on 06/25/19 with gait disturbance. He has had ongoing symptoms of neck pain, numbness of the arms, back pain, and weakness of the legs that started several weeks ago. His symptoms have been progressive and per patient report he had progressed from Independent ambulation to a wheelchair. Work up revealed servere cervical spinal stenosis C4-5 and severe lumbar spinal stenosis L3-4 with disc herniation. On 06/25/19 pt underwent a decompressive anterior cervical discectomy C4-5, anterior cervical arthrodesis C4-5 utilizing a corticocancellus allograft bone graft, anterior cervical plating C4-5 utilizing a Alphatec plate, and decompressive lumbar laminectomy, medial facetectomy foraminotomies L3-4 bilaterally with discectomy. Pt had some difficulties urinating post surgery and a coude catheter was recommended. Pt has been seen by therapies with recommendation for CIR. Pt is to admit to CIR on 06/28/19.  Patient's medical record from [facility] has been reviewed by the rehabilitation admission coordinator and physician.   Past Medical History      Past Medical History:  Diagnosis Date  . Basal cell carcinoma of skin 2019    scalp  . HLD (hyperlipidemia)    . Hx of adenomatous colonic polyps    . Iron deficiency anemia secondary to blood loss (chronic)    . Malignant neoplasm of ascending colon ([place]) 2004  . Partial small bowel obstruction ([place]) 12/29/2013  . Uses wheelchair        Family History   family history includes Diabetes in his brother and mother.   Prior Rehab/Hospitalizations Has the patient had prior rehab or  hospitalizations prior to admission? No   Has the patient had major surgery during 100 days prior to admission? Yes              Current Medications   Current Facility-Administered Medications:  .  0.9 %  sodium chloride infusion, 250 mL, Intravenous, Continuous, [name], MD, Last Rate: 1 mL/hr at 06/26/19 0023, 250 mL at 06/26/19 0023 .  0.9 % NaCl with KCl 20 mEq/ L  infusion, , Intravenous, Continuous, [name], MD, Stopped at 06/27/19 0000 .  acetaminophen (TYLENOL) tablet 650 mg, 650 mg, Oral, Q4H PRN, 650 mg at 06/27/19 1634 **OR** acetaminophen (TYLENOL) suppository 650 mg, 650 mg, Rectal, Q4H PRN, [name], MD .  bisacodyl (DULCOLAX) EC tablet 10 mg, 10 mg, Oral, Daily PRN, Meyran, Ocie Cornfield, NP .  Chlorhexidine Gluconate Cloth 2 % PADS 6 each, 6 each, Topical, Daily, [name], MD, 6 each at 06/28/19 1006 .  dexamethasone (DECADRON) injection 4 mg, 4 mg, Intravenous, Q6H, 4 mg at 06/26/19 0519 **OR** dexamethasone (DECADRON) tablet 4 mg, 4 mg, Oral, Q6H, [name], MD, 4 mg at 06/28/19 1121 .  docusate sodium (COLACE) capsule 100 mg, 100 mg, Oral, BID, Meyran, Ocie Cornfield, NP, 100 mg at 06/28/19 1311 .  menthol-cetylpyridinium (CEPACOL) lozenge 3 mg, 1 lozenge, Oral, PRN **OR** phenol (CHLORASEPTIC) mouth spray 1 spray, 1 spray, Mouth/Throat, PRN, [name], MD .  methocarbamol (ROBAXIN) tablet 500 mg, 500 mg, Oral, Q6H PRN **OR** methocarbamol (ROBAXIN) 500 mg in dextrose 5 % 50 mL IVPB, 500 mg, Intravenous, Q6H PRN, [name], MD .  morphine 2 MG/ML injection 2 mg, 2 mg, Intravenous, Q2H PRN, [name], MD .  ondansetron [facility]) tablet 4 mg, 4 mg, Oral, Q6H PRN **OR** ondansetron (ZOFRAN) injection 4 mg, 4 mg, Intravenous, Q6H PRN, [name], MD .  oxyCODONE (Oxy IR/ROXICODONE) immediate release tablet 5 mg, 5 mg, Oral, Q3H PRN, [name], MD, 5 mg at 06/26/19 0518 .  polyethylene glycol (MIRALAX / GLYCOLAX) packet 17 g,  17 g, Oral, Daily PRN, Meyran, Ocie Cornfield, NP, 17 g at 06/28/19 1312 .  senna (SENOKOT) tablet 8.6 mg, 1 tablet, Oral, BID, [name], MD, 8.6 mg at 06/28/19 1121 .  sodium chloride flush (NS) 0.9 % injection 3 mL, 3 mL, Intravenous, Q12H, [name], MD, 3 mL at 06/28/19 1119 .  sodium chloride flush (NS) 0.9 % injection 3 mL, 3 mL, Intravenous, PRN, [name], MD .  tamsulosin [facility]) capsule 0.4 mg, 0.4 mg, Oral, QPC breakfast, [name], MD, 0.4 mg at 06/28/19 1121   Patients Current Diet:     Diet Order  Diet - low sodium heart healthy           Diet regular Room service appropriate? Yes; Fluid consistency: Thin  Diet effective now                   Precautions / Restrictions Precautions Precautions: Fall, Cervical, Back Precaution Booklet Issued: No Precaution Comments: PT verbally reviews precautions with use of Bosnian Interpreter, pt requires cues for no lifting but is able to recall other 2 back precautions Restrictions Weight Bearing Restrictions: No    Has the patient had 2 or more falls or a fall with injury in the past year? No   Prior Activity Level Community (5-7x/wk): very active prior to 3 week decline (worked full time at [employer], drove, was independent in ADLs    Prior Functional Level *pt was independent prior to decline occurring May 5th.  Self Care: Did the patient need help bathing, dressing, using the toilet or eating? Independent   Indoor Mobility: Did the patient need assistance with walking from room to room (with or without device)? Independent   Stairs: Did the patient need assistance with internal or external stairs (with or without device)? Independent   Functional Cognition: Did the patient need help planning regular tasks such as shopping or remembering to take medications? Independent   Home Assistive Devices / Equipment Home Assistive Devices/Equipment: Eyeglasses, Dentures (specify  type), Wheelchair, Hand-held shower hose, Shower chair with back Home Equipment: Walker - 4 wheels, Wheelchair - manual   Prior Device Use: Indicate devices/aids used by the patient prior to current illness, exacerbation or injury? no AD prior to decline; after decline, required wc   Current Functional Level Cognition   Overall Cognitive Status: Within Functional Limits for tasks assessed Orientation Level: Oriented X4 General Comments: difficult to assess at times 2/2 language barrier but conversation appears [facility] during session    Extremity Assessment (includes Sensation/Coordination)   Upper Extremity Assessment: RUE deficits/detail RUE Deficits / Details: RUE grossly 4-/5;pt reports some numbness in hand, reports it is improved following surgery RUE Coordination: decreased fine motor(may be chronic 2/2 arthritis) LUE Deficits / Details: Grossly 4/5  Lower Extremity Assessment: Defer to PT evaluation RLE Deficits / Details: grossly 4-/5 other than 2-/5 L DF RLE Sensation: decreased light touch RLE Coordination: decreased gross motor     ADLs   Overall ADL's : Needs assistance/impaired Eating/Feeding: Set up, Sitting Grooming: Set up, Sitting Upper Body Bathing: Min guard, Sitting Lower Body Bathing: Moderate assistance, Sit to/from stand Upper Body Dressing : Min guard, Sitting Lower Body Dressing: Moderate assistance, Sit to/from stand Toilet Transfer: Moderate assistance, Stand-pivot, RW Toilet Transfer Details (indicate cue type and reason): simulated to recliner;max cues for safe use of RW Toileting- Clothing Manipulation and Hygiene: Moderate assistance, Sit to/from stand Toileting - Clothing Manipulation Details (indicate cue type and reason): from lower surface Functional mobility during ADLs: Moderate assistance, Rolling walker General ADL Comments: pt limited by weakness, decreased activity tolerance, and numbness in RUE     Mobility   Overal bed mobility: Needs  Assistance Bed Mobility: Rolling, Sidelying to Sit Rolling: Supervision Sidelying to sit: Supervision General bed mobility comments: cues to maintain log roll     Transfers   Overall transfer level: Needs assistance Equipment used: Rolling walker (2 wheeled) Transfers: Sit to/from Stand Sit to Stand: Min guard Stand pivot transfers: Mod assist General transfer comment: PT provides cues for stand to sit utilizing hands to feel chair and slow descent  Ambulation / Gait / Stairs / Wheelchair Mobility   Ambulation/Gait Ambulation/Gait assistance: Min guard, Herbalist (Feet): 80 Feet Assistive device: Rolling walker (2 wheeled) Gait Pattern/deviations: Step-to pattern, Narrow base of support, Trunk flexed General Gait Details: pt with narrow BOS to begin ambulation, requires verbal cues to widen BOS. Pt reverts to narrow BOS with fatigue. Pt also demonstrating some R knee hyperextension with fatigue as well as increased lateral movement of pelvis with some signs of trendelenburg gait due to R hip abductor weakness. Pt also requiring cues to maintain RW closer to BOS throughout ambulation Gait velocity: reduced Gait velocity interpretation: <1.8 ft/sec, indicate of risk for recurrent falls     Posture / Balance Dynamic Sitting Balance Sitting balance - Comments: supervision Balance Overall balance assessment: Needs assistance Sitting-balance support: No upper extremity supported, Feet supported Sitting balance-Leahy Scale: Good Sitting balance - Comments: supervision Standing balance support: Single extremity supported, During functional activity Standing balance-Leahy Scale: Fair Standing balance comment: minG-close supervision with UE support of RW     Special needs/care consideration Continuous Drip IV: 0.9% sodium chloride infusion, 0.9% NaCl with KCl 20 mEq/L infusion     Skin: surgical incision to back and neck   Special service needs: Will need Bosnian  interpreter     Designated visitor: wife (Sema), and daughter Council Mechanic) -Radana offered to be interpreter.     Previous Home Environment (from acute therapy documentation) Living Arrangements: Spouse/significant other Available Help at Discharge: Family, Available 24 hours/day Type of Home: House(town [place]) Home Layout: Two level Alternate Level Stairs-Rails: Right, Left Alternate Level Stairs-Number of Steps: 10 Home Access: Stairs to enter Entrance Stairs-Rails: None Entrance Stairs-Number of Steps: 2 Bathroom Shower/Tub: Chiropodist: [place]: No   Discharge Living Setting Plans for Discharge Living Setting: Patient's home, Lives with (comment)(wife) Type of Home at Discharge: House Discharge Home Layout: Two level, Bed/bath upstairs Alternate Level Stairs-Rails: Left Alternate Level Stairs-Number of Steps: 7 with landing then 7 again Discharge Home Access: Stairs to enter Entrance Stairs-Rails: None Entrance Stairs-Number of Steps: 3 Discharge Bathroom Shower/Tub: Tub/shower unit Discharge Bathroom Toilet: Standard Discharge Bathroom Accessibility: Yes How Accessible: Accessible via walker Does the patient have any problems obtaining your medications?: No   Social/Family/Support Systems Patient Roles: Spouse, Other (Comment)(full time employee) Contact Information: daughter Amedeo Plenty english): [phone]; spouse (sema): [phone];  Anticipated Caregiver: wife Sema Anticipated Caregiver's Contact Information: see above Ability/Limitations of Caregiver: Min A Caregiver Availability: 24/7 Discharge Plan Discussed with Primary Caregiver: Yes(pt, wife, and daughter) Is Caregiver In Agreement with Plan?: Yes Does Caregiver/Family have Issues with Lodging/Transportation while Pt is in Rehab?: No   Goals Patient/Family Goal for Rehab: PT/OT Mod I/Supervision; SLP: NA Expected length of stay: 10-14 days Cultural Considerations:  Moved from [place] approx 18 years ago. Speaks Bosnian -will need intrepreter Pt/Family Agrees to Admission and willing to participate: Yes Program Orientation Provided & Reviewed with Pt/Caregiver Including Roles  & Responsibilities: Yes(pt, wife, and daughter)  Barriers to Discharge: Home environment access/layout  Barriers to Discharge Comments: stairs to enter home and stairs to get to bedroom   Decrease burden of Care through IP rehab admission: NA   Possible need for SNF placement upon discharge: Not anticipated; pt has great family support and anticipate pt can reach a Mod I/Supervision level quickly through CIR stay to allow him to return home safely.    Patient Condition: I have reviewed medical records from [facility], spoken with RN,  and patient, spouse and daughter. I met with patient at the bedside for inpatient rehabilitation assessment.  Patient will benefit from ongoing PT and OT, can actively participate in 3 hours of therapy a day 5 days of the week, and can make measurable gains during the admission.  Patient will also benefit from the coordinated team approach during an Inpatient Acute Rehabilitation admission.  The patient will receive intensive therapy as well as Rehabilitation physician, nursing, social worker, and care management interventions.  Due to safety, skin/wound care, disease management, medication administration, pain management and patient education the patient requires 24 hour a day rehabilitation nursing.  The patient is currently Min G/Min A with mobility and Min G to Mod A for basic ADLs.  Discharge setting and therapy post discharge at home with home health is anticipated.  Patient has agreed to participate in the Acute Inpatient Rehabilitation Program and will admit 06/28/19.   Preadmission Screen Completed By:  Raechel Ache, 06/28/2019 3:38 PM ______________________________________________________________________   Discussed status with Dr. Posey Pronto  on 06/28/19 at 3:36PM and received approval for admission today.   Admission Coordinator:  Raechel Ache, OT, time 3:36PM/Date 06/28/19    Assessment/Plan: Diagnosis: Severe cervical spinal senosis at C4-5, severe lumbar spinal stenosis L3-4 with disc herniation, and gait disturbance; S/p surgical decompression    1. Does the need for close, 24 hr/day Medical supervision in concert with the patient's rehab needs make it unreasonable for this patient to be served in a less intensive setting? Yes/potentially 2. Co-Morbidities requiring supervision/potential complications: Partial SBO, history of colon cancer, dyslipidemia 3. Due to safety, skin/wound care, disease management, pain management and patient education, does the patient require 24 hr/day rehab nursing?  4. Does the patient require coordinated care of a physician, rehab nurse, PT, OT, and SLP to address physical and functional deficits in the context of the above medical diagnosis(es)? Potentially Addressing deficits in the following areas: balance, endurance, locomotion, strength, transferring, bathing, dressing, toileting and psychosocial support 5. Can the patient actively participate in an intensive therapy program of at least 3 hrs of therapy 5 days a week? Yes 6. The potential for patient to make measurable gains while on inpatient rehab is excellent 7. Anticipated functional outcomes upon discharge from inpatient rehab: modified independent and supervision PT, modified independent and supervision OT, n/a SLP 8. Estimated rehab length of stay to reach the above functional goals is: 7-11 days. 9. Anticipated discharge destination: Home 10. Overall Rehab/Functional Prognosis: excellent     MD Signature: Delice Lesch, MD, ABPMR          Revision History

## 2019-06-28 NOTE — Progress Notes (Signed)
Inpatient Rehabilitation Medication Review by a Pharmacist  A complete drug regimen review was completed for this patient to identify any potential clinically significant medication issues.  Clinically significant medication issues were identified:  no     Name of provider notified for urgent issues identified: n/a  Provider Method of Notification: n/a   For non-urgent medication issues to be resolved on team rounds tomorrow morning a [facility] [place] was sent to:    Pharmacist comments:   Time spent performing this drug regimen review (minutes):  10   Zaryiah Barz A. Levada Dy, PharmD, BCPS, FNKF Clinical Pharmacist [place] Please utilize Amion for appropriate phone number to reach the unit pharmacist ([place])  06/28/2019 5:36 PM

## 2019-06-28 NOTE — Progress Notes (Signed)
Inpatient Rehabilitation-Admissions Coordinator   I have received insurance approval and medical clearance by Dr. Ronnald Ramp for admit to CIR today. Pt and his family notified of bed offer and have accepted. I have reviewed insurance benefits letter and consent forms with pt and his family. All questions answered. RN and [facility] team aware of plan for admit to CIR today.   Please call if questions.   Raechel Ache, OTR/L  Rehab Admissions Coordinator  [phone] 06/28/2019 3:41 PM

## 2019-06-28 NOTE — Progress Notes (Signed)
Pt discharged to CIR. Report called to 4MW RN, PIV removed, and room and personal belongings packed. Pt escorted to 4MW05 in bed.   Justice Rocher, RN

## 2019-06-28 NOTE — Progress Notes (Signed)
Physical Therapy Treatment Patient Details Name: Alan Riley MRN: [id number] DOB: 02/10/1945 Today's Date: 06/28/2019    History of Present Illness 74 y.o. male admitted for gait disturbance. The patient first presented to me with complaints of neck pain, numbness of the arm(s) and back pain and weakness in the legs. Onset of symptoms was several weeks ago. Imaging with findings of HNP/spondylosis/stenosis at C4-5 and L3-4. Pt underwent decompressive anterior cervical discectomy C4-5 with arthrodesis, and L3-4 decompressive lumbar laminectomy with medial facetectomy foraminotomies bilaterally with discectomy.    PT Comments    Pt tolerates treatment well with increased ambulation tolerance. Pt continues to demonstrate RLE weakness and some aspects of impaired proprioception in this extremity with intermittent scissoring of gait, knee hyperextension, and some trendelenburg gait with fatigue. PT adjusting RW height to reduce trunk flexion and promote erect stand during OOB mobility. Pt will continue to benefit from aggressive PT POC and mobility to improve activity tolerance and reduce falls risk. PT continues to recommend CIR at this time as pt was very independent prior to surgery and demonstrates the potential to return to independent mobility.  Follow Up Recommendations  CIR;Supervision/Assistance - 24 hour     Equipment Recommendations  Rolling walker with 5" wheels;3in1 (PT)    Recommendations for Other Services       Precautions / Restrictions Precautions Precautions: Fall;Cervical;Back Precaution Booklet Issued: No Required Braces or Orthoses: (no brace orders) Restrictions Weight Bearing Restrictions: No    Mobility  Bed Mobility                  Transfers Overall transfer level: Needs assistance Equipment used: Rolling walker (2 wheeled) Transfers: Sit to/from Stand Sit to Stand: Min guard            Ambulation/Gait Ambulation/Gait assistance: Min  guard;Min assist Gait Distance (Feet): 80 Feet Assistive device: Rolling walker (2 wheeled) Gait Pattern/deviations: Step-to pattern;Narrow base of support;Trunk flexed Gait velocity: reduced Gait velocity interpretation: <1.8 ft/sec, indicate of risk for recurrent falls General Gait Details: pt with narrow BOS to begin ambulation, requires verbal cues to widen BOS. Pt reverts to narrow BOS with fatigue. Pt also demonstrating some R knee hyperextension with fatigue as well as increased lateral movement of pelvis with some signs of trendelenburg gait due to R hip abductor weakness. Pt also requiring cues to maintain RW closer to BOS throughout ambulation   Stairs             Wheelchair Mobility    Modified Rankin (Stroke Patients Only)       Balance Overall balance assessment: Needs assistance Sitting-balance support: No upper extremity supported;Feet supported Sitting balance-Leahy Scale: Good Sitting balance - Comments: supervision   Standing balance support: Single extremity supported;During functional activity Standing balance-Leahy Scale: Fair Standing balance comment: minG-close supervision with UE support of RW                            Cognition Arousal/Alertness: Awake/alert Behavior During Therapy: WFL for tasks assessed/performed Overall Cognitive Status: Within Functional Limits for tasks assessed                                 General Comments: difficult to assess at times 2/2 language barrier but conversation appears [facility] during session      Exercises      General Comments General comments (skin integrity, edema, etc.): VSS  on RA      Pertinent Vitals/Pain Pain Assessment: Faces Faces Pain Scale: Hurts a little bit Pain Location: back Pain Descriptors / Indicators: Sore Pain Intervention(s): Monitored during session    Home Living                      Prior Function            PT Goals (current goals can  now be found in the care plan section) Acute Rehab PT Goals Patient Stated Goal: To return to independent mobility Progress towards PT goals: Progressing toward goals    Frequency    Min 5X/week      PT Plan Current plan remains appropriate    Co-evaluation              AM-PAC PT "6 Clicks" Mobility   Outcome Measure  Help needed turning from your back to your side while in a flat bed without using bedrails?: None Help needed moving from lying on your back to sitting on the side of a flat bed without using bedrails?: None Help needed moving to and from a bed to a chair (including a wheelchair)?: A Little Help needed standing up from a chair using your arms (e.g., wheelchair or bedside chair)?: A Little Help needed to walk in hospital room?: A Little Help needed climbing 3-5 steps with a railing? : A Lot 6 Click Score: 19    End of Session   Activity Tolerance: Patient tolerated treatment well Patient left: in chair;with call bell/phone within reach;with family/visitor present Nurse Communication: Mobility status PT Visit Diagnosis: Unsteadiness on feet (R26.81);Muscle weakness (generalized) (M62.81);Other symptoms and signs involving the nervous system (R29.898)     Time: 7543-6067 PT Time Calculation (min) (ACUTE ONLY): 27 min  Charges:  $Gait Training: 23-37 mins                     Zenaida Niece, PT, DPT Acute Rehabilitation Pager: [phone]    Zenaida Niece 06/28/2019, 2:53 PM

## 2019-06-29 ENCOUNTER — Other Ambulatory Visit: Payer: Self-pay

## 2019-06-29 ENCOUNTER — Inpatient Hospital Stay (HOSPITAL_COMMUNITY): Payer: Medicare HMO | Admitting: Physical Therapy

## 2019-06-29 ENCOUNTER — Inpatient Hospital Stay (HOSPITAL_COMMUNITY): Payer: Medicare HMO | Admitting: Occupational Therapy

## 2019-06-29 DIAGNOSIS — E785 Hyperlipidemia, unspecified: Secondary | ICD-10-CM

## 2019-06-29 DIAGNOSIS — R7303 Prediabetes: Secondary | ICD-10-CM

## 2019-06-29 DIAGNOSIS — M5416 Radiculopathy, lumbar region: Secondary | ICD-10-CM

## 2019-06-29 DIAGNOSIS — G8918 Other acute postprocedural pain: Secondary | ICD-10-CM

## 2019-06-29 DIAGNOSIS — K5903 Drug induced constipation: Secondary | ICD-10-CM

## 2019-06-29 MED ORDER — SENNOSIDES-DOCUSATE SODIUM 8.6-50 MG PO TABS
2.0000 | ORAL_TABLET | Freq: Every day | ORAL | Status: DC
  Administered 2019-06-29: 2 via ORAL
  Filled 2019-06-29: qty 2

## 2019-06-29 MED ORDER — CHLORHEXIDINE GLUCONATE CLOTH 2 % EX PADS
6.0000 | MEDICATED_PAD | Freq: Every day | CUTANEOUS | Status: DC
  Administered 2019-06-29 – 2019-06-30 (×2): 6 via TOPICAL

## 2019-06-29 MED ORDER — POLYETHYLENE GLYCOL 3350 17 G PO PACK
17.0000 g | PACK | Freq: Two times a day (BID) | ORAL | Status: DC
  Administered 2019-06-29 (×2): 17 g via ORAL
  Filled 2019-06-29: qty 1

## 2019-06-29 NOTE — Evaluation (Signed)
Occupational Therapy Assessment and Plan  Patient Details  Name: Alan Riley MRN: [id number] Date of Birth: 09/03/1945  OT Diagnosis: abnormal posture, ataxia, muscle weakness (generalized) and coordination disorder, ataxic Rehab Potential: Rehab Potential (ACUTE ONLY): Fair ELOS: 7-10 days   Today's Date: 06/29/2019 OT Individual Time: 2229-7989 OT Individual Time Calculation (min): 58 min     Problem List:  Patient Active Problem List   Diagnosis Date Noted  . Radiculopathy 06/28/2019  . Drug induced constipation   . Dyslipidemia   . Urinary retention   . Postoperative pain   . S/P cervical discectomy   . Cervical herniated disc 06/25/2019  . S/P cervical spinal fusion 06/25/2019  . Memory difficulties 02/23/2016  . Right shoulder pain 10/05/2015  . Prediabetes 04/17/2015  . Subclinical hypothyroidism 12/22/2014  . Healthcare maintenance 12/22/2014  . Pure hypercholesterolemia 11/29/2013  . Basal cell carcinoma of brow 11/29/2013  . History of colon cancer, stage III 04/14/2011    Past Medical History:  Past Medical History:  Diagnosis Date  . Basal cell carcinoma of skin 2019   scalp  . HLD (hyperlipidemia)   . Hx of adenomatous colonic polyps   . Iron deficiency anemia secondary to blood loss (chronic)   . Malignant neoplasm of ascending colon ([place]) 2004  . Partial small bowel obstruction ([place]) 12/29/2013  . Uses wheelchair    Past Surgical History:  Past Surgical History:  Procedure Laterality Date  . ANTERIOR CERVICAL DECOMP/DISCECTOMY FUSION N/A 06/25/2019   Procedure: C4-5 ACDF;  Surgeon: [name], MD;  Location: [facility];  Service: Neurosurgery;  Laterality: N/A;  . BACK SURGERY     over 30 yrs ago   . CHOLECYSTECTOMY  1991  . COLONOSCOPY  2013, 2016   x 2  . HEMICOLECTOMY  01/15/03  . LUMBAR LAMINECTOMY/DECOMPRESSION MICRODISCECTOMY Bilateral 06/25/2019   Procedure: L3-4 LUMBAR LAMINECTOMY/DECOMPRESSION MICRODISCECTOMY;  Surgeon: [name], MD;   Location: [facility];  Service: Neurosurgery;  Laterality: Bilateral;    Assessment & Plan Clinical Impression: Patient is a 74 y.o. year old male with right-handed non-English-speaking male from [place].  History taken from chart review due to language.  Past medical history of hyperlipidemia as well as back surgery 30 years ago.  Patient lives with spouse.  Two-level home 2 steps to entry.  Patient was independent up until approximately 3 weeks ago with noted decreased functional mobility and unable to complete ADLs.  He presented on 06/25/2019 with gait abnormality, neck pain, dysesthesias in upper extremities and weakness in lower extremities times several weeks.  Work-up and imaging revealed severe cervical spinal stenosis C4-5 as well as severe lumbar stenosis L3-4 disc herniation.  He underwent decompressive anterior cervical discectomy C4-5 anterior cervical arthrodesis C4-5 with anterior cervical plating as well as decompressive lumbar laminectomy medial for foraminotomies L3-4 bilaterally with discectomy on 06/25/2019 per Dr. Sherley Bounds.  No brace needed.  Decadron protocol as indicated.  Hospital course complicated by bouts of urinary retention and indwelling Foley was placed.  He is also had constipation.  Therapy evaluation completed with recommendations of physical medicine rehab consult.  Please see preadmission assessment from earlier today as well. .  Patient transferred to CIR on 06/28/2019 .    Patient currently requires mod - max A with basic self-care skills secondary to muscle weakness, decreased cardiorespiratoy endurance, ataxia and decreased sitting balance, decreased standing balance and decreased balance strategies.  Prior to hospitalization, patient could complete ADLs and IADLs with independent .  Patient will  benefit from skilled intervention to decrease level of assist with basic self-care skills prior to discharge home with care partner.  Anticipate patient will require 24 hour  supervision and follow up home health.  OT - End of Session Activity Tolerance: Decreased this session Endurance Deficit: Yes Endurance Deficit Description: multiple rest breaks with self care tasks OT Assessment Rehab Potential (ACUTE ONLY): Fair OT Barriers to Discharge: Other (comments) OT Barriers to Discharge Comments: none known at this time OT Patient demonstrates impairments in the following area(s): Balance;Endurance;Motor;Pain;Safety OT Basic ADL's Functional Problem(s): Grooming;Bathing;Dressing;Toileting OT Transfers Functional Problem(s): Toilet;Tub/Shower OT Additional Impairment(s): None OT Plan OT Intensity: Minimum of 1-2 x/day, 45 to 90 minutes OT Frequency: 5 out of 7 days OT Duration/Estimated Length of Stay: 7-10 days OT Treatment/Interventions: Balance/vestibular training;DME/adaptive equipment instruction;Patient/family education;Therapeutic Activities;Cognitive remediation/compensation;Psychosocial support;Therapeutic Exercise;Community reintegration;Functional mobility training;UE/LE Strength taining/ROM;Self Care/advanced ADL retraining;Discharge planning;UE/LE Coordination activities;Pain management OT Self Feeding Anticipated Outcome(s): n/a OT Basic Self-Care Anticipated Outcome(s): supervison OT Toileting Anticipated Outcome(s): supervisoin OT Bathroom Transfers Anticipated Outcome(s): supervision OT Recommendation Recommendations for Other Services: Neuropsych consult Patient destination: Home Follow Up Recommendations: Home health OT;24 hour supervision/assistance Equipment Recommended: To be determined   Skilled Therapeutic Intervention Upon entering the room, pt seated in wheelchair with interpreter present in the room. Pt agreeable to shower this session. Pt obtaining clothing items from bag placed on lap. Stand pivot transfer from wheelchair to TTB for shower with mod A and use of grab bar. Pt remained seated in shower with assistance to wash  buttocks with lateral lean and assist to wash B Lower legs and feet. Pt donning LB pants from shower chair secondary to interpreter being present with max A. Pt donning pull over shirt with set up A. Pt seated in wheelchair at sink for grooming tasks with set up A. OT educating pt on OT purpose, POC, and goals. Pt verbalized understanding and agreement. Pt remained in wheelchair with call bell and all needed items within reach. Chair alarm activated.  OT Evaluation Precautions/Restrictions  Precautions Precautions: Fall;Back Required Braces or Orthoses: (no brace) Restrictions Weight Bearing Restrictions: No   Pain Pain Assessment Pain Scale: 0-10 Pain Score: 0-No pain Home Living/Prior Functioning Home Living Available Help at Discharge: Family, Available 24 hours/day Type of Home: House(town house) Home Access: Stairs to enter [employer] of Steps: 2 Entrance Stairs-Rails: None Home Layout: Two level Alternate Level Stairs-Number of Steps: 6+6 Alternate Level Stairs-Rails: Right, Left Bathroom Shower/Tub: Chiropodist: Standard  Lives With: Spouse Prior Function Level of Independence: Independent with basic ADLs  Able to Take Stairs?: Yes Driving: Yes Vocation: Full time employment Comments: pt working [employer] manager, was driving prior to LE weakness Vision Baseline Vision/History: Wears glasses Wears Glasses: Reading only Patient Visual Report: No change from baseline Vision Assessment?: No apparent visual deficits Praxis Praxis: Intact Cognition Overall Cognitive Status: Within Functional Limits for tasks assessed Arousal/Alertness: Awake/alert Orientation Level: Person;Place;Situation Person: Oriented Place: Oriented Situation: Oriented Year: 2021 Month: June Day of Week: Correct Memory: Appears intact Immediate Memory Recall: Sock;Blue;Bed Memory Recall Sock: Without Cue Memory Recall Blue: Without Cue Memory Recall Bed:  Without Cue Safety/Judgment: Appears intact Sensation Sensation Light Touch: Impaired Detail Peripheral sensation comments: distal parasthesia Light Touch Impaired Details: Impaired RLE Proprioception: Impaired by gross assessment Additional Comments: foot drop RLE Coordination Gross Motor Movements are Fluid and Coordinated: No Fine Motor Movements are Fluid and Coordinated: Yes Coordination and Movement Description: mild ataxia Motor  Motor Motor: Other (comment);Paraplegia;Ataxia Motor -  Skilled Clinical Observations: RLE foot drop all other MM symmetrical R and L, but mild ataxia with gait Mobility  Bed Mobility Bed Mobility: Rolling Right;Rolling Left;Sit to Supine;Supine to Sit Rolling Right: Supervision/verbal cueing Rolling Left: Supervision/Verbal cueing Supine to Sit: Minimal Assistance - Patient > 75% Sit to Supine: Minimal Assistance - Patient > 75% Transfers Sit to Stand: Minimal Assistance - Patient > 75%  Trunk/Postural Assessment  Cervical Assessment Cervical Assessment: Exceptions to WFL(cervical prec) Thoracic Assessment Thoracic Assessment: Within Functional Limits Lumbar Assessment Lumbar Assessment: Exceptions to WFL(back prec) Postural Control Postural Control: Within Functional Limits  Balance Balance Balance Assessed: Yes Static Sitting Balance Static Sitting - Balance Support: No upper extremity supported Static Sitting - Level of Assistance: 7: Independent Dynamic Sitting Balance Dynamic Sitting - Balance Support: No upper extremity supported Dynamic Sitting - Level of Assistance: 5: Stand by assistance Static Standing Balance Static Standing - Balance Support: No upper extremity supported Static Standing - Level of Assistance: 4: Min assist Dynamic Standing Balance Dynamic Standing - Balance Support: No upper extremity supported Dynamic Standing - Level of Assistance: 3: Mod assist Extremity/Trunk Assessment RUE Assessment RUE Assessment:  Within Functional Limits LUE Assessment LUE Assessment: Within Functional Limits     Refer to Care Plan for Long Term Goals  Recommendations for other services: Neuropsych   Discharge Criteria: Patient will be discharged from OT if patient refuses treatment 3 consecutive times without medical reason, if treatment goals not met, if there is a change in medical status, if patient makes no progress towards goals or if patient is discharged from hospital.  The above assessment, treatment plan, treatment alternatives and goals were discussed and mutually agreed upon: by patient  Gypsy Decant 06/29/2019, 12:45 PM

## 2019-06-29 NOTE — Progress Notes (Addendum)
[place] PHYSICAL MEDICINE & REHABILITATION PROGRESS NOTE  Subjective/Complaints: Patient seen sitting up in bed this morning.  He states he slept well overnight.  No reported issues overnight.  Discussed the same Foley with patient.  Disc function and assistance level with therapies.  ROS: Denies CP, shortness of breath, nausea, vomiting, diarrhea.  Objective: Vital Signs: Blood pressure 140/69, pulse 62, temperature 98.1 F (36.7 C), temperature source Oral, resp. rate 17, SpO2 98 %. No results found. No results for input(s): WBC, HGB, HCT, PLT in the last 72 hours. No results for input(s): NA, K, CL, CO2, GLUCOSE, BUN, CREATININE, CALCIUM in the last 72 hours.  Physical Exam: BP 140/69 (BP Location: Left Arm)   Pulse 62   Temp 98.1 F (36.7 C) (Oral)   Resp 17   SpO2 98%  Constitutional: No distress . Vital signs reviewed. HENT: Normocephalic.  Atraumatic. Eyes: EOMI. No discharge. Cardiovascular: No JVD. Respiratory: Normal effort.  No stridor. GI: Non-distended. Skin: Surgical site C/C/I Psych: Normal mood.  Normal behavior. Musc: No edema in extremities.  No tenderness in extremities. Neurological: Alert Makes eye contact with examiner.   Follows simple demonstrated commands. Motor: Bilateral upper extremities: 5/5 proximal distal Left lower extremity: 5/5 proximal distal Right lower extremity: Hip flexion, knee extension 4+/5, ankle dorsiflexion 0/5 , unchanged  Assessment/Plan: 1. Functional deficits secondary to radiculopathy which require 3+ hours per day of interdisciplinary therapy in a comprehensive inpatient rehab setting.  Physiatrist is providing close team supervision and 24 hour management of active medical problems listed below.  Physiatrist and rehab team continue to assess barriers to discharge/monitor patient progress toward functional and medical goals  Care Tool:  Bathing              Bathing assist       Upper Body  Dressing/Undressing Upper body dressing   What is the patient wearing?: Hospital gown only    Upper body assist      Lower Body Dressing/Undressing Lower body dressing      What is the patient wearing?: Incontinence brief     Lower body assist       Toileting Toileting    Toileting assist       Transfers Chair/bed transfer  Transfers assist     Chair/bed transfer assist level: Moderate Assistance - Patient 50 - 74%     Locomotion Ambulation   Ambulation assist      Assist level: Maximal Assistance - Patient 25 - 49% Assistive device: Hand held assist Max distance: 3   Walk 10 feet activity   Assist  Walk 10 feet activity did not occur: Safety/medical concerns        Walk 50 feet activity   Assist Walk 50 feet with 2 turns activity did not occur: Safety/medical concerns         Walk 150 feet activity   Assist Walk 150 feet activity did not occur: Safety/medical concerns         Walk 10 feet on uneven surface  activity   Assist Walk 10 feet on uneven surfaces activity did not occur: Safety/medical concerns         Wheelchair     Assist   Type of Wheelchair: Manual    Wheelchair assist level: Minimal Assistance - Patient > 75% Max wheelchair distance: 150    Wheelchair 50 feet with 2 turns activity    Assist        Assist Level: Minimal Assistance - Patient > 75%  Wheelchair 150 feet activity     Assist     Assist Level: Minimal Assistance - Patient > 75%      Medical Problem List and Plan: 1.  Gait disturbance secondary to lumbar cervical stenosis with radiculopathy.S/P decompressive anterior cervical discectomy C4-5 with arthrodesis and lumbar laminectomy with foraminotomies L3-4 with discectomy 06/25/2019.  No brace required  Begin CIR evaluations   Will order PRAFO 2.  Antithrombotics: -DVT/anticoagulation: SCDs.    Vascular study pending             -antiplatelet therapy: N/A 3. Pain  Management: Oxycodone and Robaxin as needed             Monitor with increased exertion 4. Mood: Provide emotional support             -antipsychotic agents: N/A 5. Neuropsych: This patient is capable of making decisions on his own behalf. 6. Skin/Wound Care: Routine skin checks 7. Fluids/Electrolytes/Nutrition: Routine in and outs.    CMP ordered 8.  Urinary retention.  Flomax 0.4 mg daily.    DC Foley, check PVRs 9.  Hyperlipidemia.  Resume Lipitor 10.  Drug-induced constipation  Bowel meds increased on 6/5             Adjust bowel meds as necessary 11.  Prediabetes  Monitor with increased mobility  LOS: 1 days A FACE TO FACE EVALUATION WAS PERFORMED  Alan Riley Lorie Phenix 06/29/2019, 12:08 PM

## 2019-06-29 NOTE — Progress Notes (Signed)
Physical Therapy Session Note  Patient Details  Name: Alan Riley MRN: [id number] Date of Birth: [birth year]  Today's Date: 06/29/2019 PT Individual Time: 9574-7340 PT Individual Time Calculation (min): 68 min   Short Term Goals: Week 1:  PT Short Term Goal 1 (Week 1): STG=LTG due to ELOS  Skilled Therapeutic Interventions/Progress Updates:   Pt received supine in bed and agreeable to PT. Supine>sit transfer with supervision assist and cues for back precautions. PT drained Catheter bag. Stand pivot transfer to [facility] with RW and min assist. Pt transported to rehab gym in [facility].  Pt performed 5xSTS: 30sec (>15 sec indicates increased fall risk)   PT instructed pt in TUG: 68 sec (average of 3 trials; >13.5 sec indicates increased fall risk)   Stair management training on 3 inch step with min assist x 3. Performed 6inch step x 2 with min assist in ascent and mod assist to descend posteriorly. PT then instructed pt in anterior ascent and descent with min assist overall and moderate cues for step to gait pattern.     WC mobility x 177f with min-supervision assist for safety and cues for symmetry R and L to improve control in straight trajectory. PT corrected scheduling Error to verify interpreter for AM treatments on 07/01/19. [facility] with min assist with cues for UE placement and anterior weight shift.    [facility] and all needs met.       Therapy Documentation Precautions:  Precautions Precautions: Fall, Back Required Braces or Orthoses: (no brace) Restrictions Weight Bearing Restrictions: No Vital Signs: Therapy Vitals Temp: 98.8 F (37.1 C) Temp Source: Oral Pulse Rate: 68 Resp: 17 BP: 136/73 Patient Position (if appropriate): Sitting Oxygen Therapy SpO2: 98 % O2 Device: Room Air Pain: denies   Therapy/Group: Individual Therapy  ALorie Phenix6/05/2019, 4:41 PM

## 2019-06-29 NOTE — Evaluation (Signed)
Physical Therapy Assessment and Plan  Patient Details  Name: Alan Riley MRN: [id number] Date of Birth: 1945/09/25  PT Diagnosis: Abnormality of gait, Ataxic gait, Coordination disorder, Difficulty walking, Impaired sensation and Muscle weakness Rehab Potential: Good ELOS: 7-10 days   Today's Date: 06/29/2019 PT Individual Time: 0800-0905 PT Individual Time Calculation (min): 65 min    Problem List:  Patient Active Problem List   Diagnosis Date Noted   Radiculopathy 06/28/2019   Drug induced constipation    Dyslipidemia    Urinary retention    Postoperative pain    S/P cervical discectomy    Cervical herniated disc 06/25/2019   S/P cervical spinal fusion 06/25/2019   Memory difficulties 02/23/2016   Right shoulder pain 10/05/2015   Prediabetes 04/17/2015   Subclinical hypothyroidism 12/22/2014   Healthcare maintenance 12/22/2014   Pure hypercholesterolemia 11/29/2013   Basal cell carcinoma of brow 11/29/2013   History of colon cancer, stage III 04/14/2011    Past Medical History:  Past Medical History:  Diagnosis Date   Basal cell carcinoma of skin 2019   scalp   HLD (hyperlipidemia)    Hx of adenomatous colonic polyps    Iron deficiency anemia secondary to blood loss (chronic)    Malignant neoplasm of ascending colon ([place]) 2004   Partial small bowel obstruction ([place]) 12/29/2013   Uses wheelchair    Past Surgical History:  Past Surgical History:  Procedure Laterality Date   ANTERIOR CERVICAL DECOMP/DISCECTOMY FUSION N/A 06/25/2019   Procedure: C4-5 ACDF;  Surgeon: [name], MD;  Location: [facility];  Service: Neurosurgery;  Laterality: N/A;   BACK SURGERY     over 30 yrs ago    [place]  2013, 2016   x 2   HEMICOLECTOMY  01/15/03   LUMBAR LAMINECTOMY/DECOMPRESSION MICRODISCECTOMY Bilateral 06/25/2019   Procedure: L3-4 LUMBAR LAMINECTOMY/DECOMPRESSION MICRODISCECTOMY;  Surgeon: [name], MD;   Location: [facility];  Service: Neurosurgery;  Laterality: Bilateral;    Assessment & Plan Clinical Impression: Patient is a 74 year old right-handed non-English-speaking male from [place].  History taken from chart review due to language.  Past medical history of hyperlipidemia as well as back surgery 30 years ago.  Patient lives with spouse.  Two-level home 2 steps to entry.  Patient was independent up until approximately 3 weeks ago with noted decreased functional mobility and unable to complete ADLs.  He presented on 06/25/2019 with gait abnormality, neck pain, dysesthesias in upper extremities and weakness in lower extremities times several weeks.  Work-up and imaging revealed severe cervical spinal stenosis C4-5 as well as severe lumbar stenosis L3-4 disc herniation.  He underwent decompressive anterior cervical discectomy C4-5 anterior cervical arthrodesis C4-5 with anterior cervical plating as well as decompressive lumbar laminectomy medial for foraminotomies L3-4 bilaterally with discectomy on 06/25/2019 per Dr. Sherley Bounds.  No brace needed.  Decadron protocol as indicated.  Hospital course complicated by bouts of urinary retention and indwelling Foley was placed.   Patient transferred to CIR on 06/28/2019 .   Patient currently requires mod with mobility secondary to muscle weakness, muscle joint tightness and muscle paralysis, decreased cardiorespiratoy endurance, abnormal tone, ataxia and decreased coordination and decreased sitting balance, decreased standing balance and decreased balance strategies.  Prior to hospitalization, patient was independent  with mobility and lived with wife   in a House(town ho,e) home.  Home access is 2Stairs to enter.  Patient will benefit from skilled PT intervention to maximize safe functional mobility, minimize fall risk  and decrease caregiver burden for planned discharge home with 24 hour supervision.  Anticipate patient will benefit from follow up [place] at discharge.  PT -  End of Session Activity Tolerance: Tolerates 10 - 20 min activity with multiple rests Endurance Deficit: Yes PT Assessment Rehab Potential (ACUTE/IP ONLY): Good PT Barriers to Discharge: [place] home environment;Decreased caregiver support;Home environment access/layout;Insurance for SNF coverage PT Patient demonstrates impairments in the following area(s): Balance;Edema;Endurance;Motor;Pain;Perception;Safety;Sensory PT Transfers Functional Problem(s): Bed Mobility;Car;Bed to Chair;Furniture;Floor PT Locomotion Functional Problem(s): Ambulation;Wheelchair Mobility;Stairs PT Plan PT Intensity: Minimum of 1-2 x/day ,45 to 90 minutes PT Frequency: 5 out of 7 days PT Duration Estimated Length of Stay: 7-10 days PT Treatment/Interventions: Ambulation/gait training;Discharge planning;Functional mobility training;Visual/perceptual remediation/compensation;Therapeutic Activities;Psychosocial support;Balance/vestibular training;Disease management/prevention;Neuromuscular re-education;Skin care/wound management;Therapeutic Exercise;Wheelchair propulsion/positioning;Cognitive remediation/compensation;DME/adaptive equipment instruction;Pain management;Splinting/orthotics;UE/LE Strength taining/ROM;Community reintegration;Functional electrical stimulation;Patient/family education;UE/LE Coordination activities PT Transfers Anticipated Outcome(s): supervision assist with LRAD PT Locomotion Anticipated Outcome(s): supervision assist with LRAD at ambulatory level with RW PT Recommendation Recommendations for Other Services: Therapeutic Recreation consult;Neuropsych consult Therapeutic Recreation Interventions: Stress management;Outing/community reintergration Follow Up Recommendations: Home health PT Patient destination: Home Equipment Recommended: To be determined Equipment Details: pt has transport WC and RW per chart  Skilled Therapeutic Intervention Pt received supine in bed and agreeable to PT.  Supine>sit transfer with min assist and cues back precautions. Interpreter present throughout treatment. PT instructed patient in PT Evaluation and initiated treatment intervention; see below for results. PT educated patient in [place], rehab potential, rehab goals, and discharge recommendations. Sit<>stand x 5 throughout treatment with min assist with no AD and min-mCGA without RW. Stand pivot transfer without AD and min assist as listed below with PT requiring heavy UE support. Car transfer with mod assist and mild ataxia noted in the RLE without AD. Pt unable to perform stair management due to RLE ataxia and severe foot drop, limiting safety without rails. Gait training without AD x 3 ft as listed below. PT applied DF wrap and use of RW for gait training x 76f with min assist. [facility] returned to room and [facility] and all needs met.       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 Vision/Perception  Perception Perception: Within Functional Limits Praxis Praxis: Intact  Cognition Overall Cognitive Status: Within Functional Limits for tasks assessed Orientation Level: Oriented X4 Safety/Judgment: Appears intact Sensation Sensation Light Touch: Impaired Detail Peripheral sensation comments: distal parasthesia Light Touch  Impaired Details: Impaired RLE Proprioception: Impaired by gross assessment(RLE) Additional Comments: foot drop RLE Coordination Gross Motor Movements are Fluid and Coordinated: No Fine Motor Movements are Fluid and Coordinated: Yes Coordination and Movement Description: mild ataxia in the RLE Finger Nose Finger Test: [facility] He[facility] Shin Test: symmetric R and L, but noted foot drop Motor  Motor Motor: Other (comment);Paraplegia;Ataxia Motor - Skilled Clinical Observations: RLE foot drop all other MM symmetrical R and L, but mild ataxia with gait  Mobility Bed Mobility Bed Mobility: Rolling Right;Rolling  Left;Sit to Supine;Supine to Sit Rolling Right: Supervision/verbal cueing Rolling Left: Supervision/Verbal cueing Supine to Sit: Minimal Assistance - Patient > 75% Sit to Supine: Minimal Assistance - Patient > 75% Transfers Transfers: Sit to Stand;Stand Pivot Transfers Sit to Stand: Minimal Assistance - Patient > 75% Stand Pivot Transfers: Minimal Assistance - Patient > 75% Transfer (Assistive device): None Locomotion  Gait Ambulation: Yes Gait Assistance: Maximal Assistance - Patient 25-49% Gait Distance (Feet): 3 Feet Assistive device: None;1 person hand held assist Gait Gait: Yes Gait Pattern: Poor foot clearance - right;Right foot flat;Step-through pattern Wheelchair Mobility Wheelchair Mobility: Yes Wheelchair Assistance: Minimal assistance - Patient >75% Wheelchair Propulsion: Both upper extremities Wheelchair Parts Management: Needs assistance Distance: 150  Trunk/Postural Assessment  Cervical Assessment Cervical Assessment: Exceptions to WFL(cervical ACAD) Thoracic Assessment Thoracic Assessment: Within Functional Limits Lumbar Assessment Lumbar Assessment: Exceptions to WFL(back precustions with lamenectomy) Postural Control Postural Control: Within Functional Limits(delayed R in standing)  Balance Balance Balance Assessed: Yes Static Sitting Balance Static  Sitting - Balance Support: No upper extremity supported Static Sitting - Level of Assistance: 7: Independent Dynamic Sitting Balance Dynamic Sitting - Balance Support: No upper extremity supported Dynamic Sitting - Level of Assistance: 5: Stand by assistance Static Standing Balance Static Standing - Balance Support: No upper extremity supported Static Standing - Level of Assistance: 4: Min assist Dynamic Standing Balance Dynamic Standing - Balance Support: No upper extremity supported Dynamic Standing - Level of Assistance: 3: Mod assist Extremity Assessment      RLE Assessment RLE Assessment: Exceptions to [facility] General Strength Comments: grossly 4/5 to 4+/5 proximal to distal except DF:0/5 LLE Assessment LLE Assessment: Within Functional Limits General Strength Comments: grossly 4+/5 PROXIMAL TO DISTAL    Refer to Care Plan for Long Term Goals  Recommendations for other services: Neuropsych and Therapeutic Recreation  Stress management and Outing/community reintegration  Discharge Criteria: Patient will be discharged from PT if patient refuses treatment 3 consecutive times without medical reason, if treatment goals not met, if there is a change in medical status, if patient makes no progress towards goals or if patient is discharged from hospital.  The above assessment, treatment plan, treatment alternatives and goals were discussed and mutually agreed upon: by patient  Lorie Phenix 06/29/2019, 10:18 AM

## 2019-06-30 ENCOUNTER — Inpatient Hospital Stay (HOSPITAL_COMMUNITY): Payer: Medicare HMO

## 2019-06-30 DIAGNOSIS — M7989 Other specified soft tissue disorders: Secondary | ICD-10-CM

## 2019-06-30 DIAGNOSIS — R030 Elevated blood-pressure reading, without diagnosis of hypertension: Secondary | ICD-10-CM

## 2019-06-30 DIAGNOSIS — R339 Retention of urine, unspecified: Secondary | ICD-10-CM

## 2019-06-30 MED ORDER — SENNOSIDES-DOCUSATE SODIUM 8.6-50 MG PO TABS
1.0000 | ORAL_TABLET | Freq: Every day | ORAL | Status: DC
  Administered 2019-07-01 – 2019-07-08 (×8): 1 via ORAL
  Filled 2019-06-30 (×8): qty 1

## 2019-06-30 MED ORDER — POLYETHYLENE GLYCOL 3350 17 G PO PACK
17.0000 g | PACK | Freq: Every day | ORAL | Status: DC
  Administered 2019-07-01 – 2019-07-08 (×7): 17 g via ORAL
  Filled 2019-06-30 (×7): qty 1

## 2019-06-30 NOTE — Progress Notes (Signed)
VASCULAR LAB PRELIMINARY  PRELIMINARY  PRELIMINARY  PRELIMINARY  Bilateral lower extremity venous duplex completed.    Preliminary report:  See CV proc for preliminary results.  Markeia Harkless, RVT 06/30/2019, 2:00 PM

## 2019-06-30 NOTE — Progress Notes (Signed)
[facility] PHYSICAL MEDICINE & REHABILITATION PROGRESS NOTE  Subjective/Complaints: Patient seen sitting up in bed this AM.  He indicates he did not sleep well overnight due to bowel movements, but does feel better.  His Foley remains in place, discussed with nursing.  ROS: Denies CP, shortness of breath, nausea, vomiting, diarrhea.  Objective: Vital Signs: Blood pressure (!) 153/79, pulse 64, temperature 98 F (36.7 C), resp. rate 17, SpO2 97 %. No results found. No results for input(s): WBC, HGB, HCT, PLT in the last 72 hours. No results for input(s): NA, K, CL, CO2, GLUCOSE, BUN, CREATININE, CALCIUM in the last 72 hours.  Physical Exam: BP (!) 153/79 (BP Location: Left Arm)   Pulse 64   Temp 98 F (36.7 C)   Resp 17   SpO2 97%  Constitutional: No distress . Vital signs reviewed. HENT: Normocephalic.  Atraumatic. Eyes: EOMI. No discharge. Cardiovascular: No JVD. Respiratory: Normal effort.  No stridor. GI: Non-distended. Skin: Surgical site C/D/I Psych: Normal mood.  Normal behavior. Musc: No edema in extremities.  No tenderness in extremities. Neurological: Alert Makes eye contact with examiner.   Follows simple demonstrated commands. Motor: Bilateral upper extremities: 5/5 proximal distal, unchanged Right lower extremity: Hip flexion, knee extension 4+/5, ankle dorsiflexion 0/5 , stable  Assessment/Plan: 1. Functional deficits secondary to radiculopathy which require 3+ hours per day of interdisciplinary therapy in a comprehensive inpatient rehab setting.  Physiatrist is providing close team supervision and 24 hour management of active medical problems listed below.  Physiatrist and rehab team continue to assess barriers to discharge/monitor patient progress toward functional and medical goals  Care Tool:  Bathing    Body parts bathed by patient: Right arm, Left arm, Chest, Abdomen, Front perineal area, Left upper leg, Face, Right upper leg   Body parts bathed by  helper: Buttocks, Right lower leg, Left lower leg     Bathing assist Assist Level: Moderate Assistance - Patient 50 - 74%     Upper Body Dressing/Undressing Upper body dressing   What is the patient wearing?: Pull over shirt    Upper body assist Assist Level: Minimal Assistance - Patient > 75%    Lower Body Dressing/Undressing Lower body dressing      What is the patient wearing?: Pants, Incontinence brief     Lower body assist Assist for lower body dressing: Moderate Assistance - Patient 50 - 74%     Toileting Toileting    Toileting assist Assist for toileting: Maximal Assistance - Patient 25 - 49%     Transfers Chair/bed transfer  Transfers assist     Chair/bed transfer assist level: Moderate Assistance - Patient 50 - 74%     Locomotion Ambulation   Ambulation assist      Assist level: Maximal Assistance - Patient 25 - 49% Assistive device: Hand held assist Max distance: 3   Walk 10 feet activity   Assist  Walk 10 feet activity did not occur: Safety/medical concerns        Walk 50 feet activity   Assist Walk 50 feet with 2 turns activity did not occur: Safety/medical concerns         Walk 150 feet activity   Assist Walk 150 feet activity did not occur: Safety/medical concerns         Walk 10 feet on uneven surface  activity   Assist Walk 10 feet on uneven surfaces activity did not occur: Safety/medical concerns         Wheelchair  Assist   Type of Wheelchair: Manual    Wheelchair assist level: Minimal Assistance - Patient > 75% Max wheelchair distance: 150    Wheelchair 50 feet with 2 turns activity    Assist        Assist Level: Minimal Assistance - Patient > 75%   Wheelchair 150 feet activity     Assist     Assist Level: Minimal Assistance - Patient > 75%      Medical Problem List and Plan: 1.  Gait disturbance secondary to lumbar cervical stenosis with radiculopathy.S/P decompressive  anterior cervical discectomy C4-5 with arthrodesis and lumbar laminectomy with foraminotomies L3-4 with discectomy 06/25/2019.  No brace required  Continue CIR  PRAFO ordered, pending, discussed with nursing  Plan to wean steroids 2.  Antithrombotics: -DVT/anticoagulation: SCDs.    Vascular study pending             -antiplatelet therapy: N/A 3. Pain Management: Oxycodone and Robaxin as needed  Controlled on 6/6             Monitor with increased exertion 4. Mood: Provide emotional support             -antipsychotic agents: N/A 5. Neuropsych: This patient is capable of making decisions on his own behalf. 6. Skin/Wound Care: Routine skin checks 7. Fluids/Electrolytes/Nutrition: Routine in and outs.    CMP ordered for tomorrow 8.  Urinary retention.  Flomax 0.4 mg daily.    DCed Foley, however remains in place, discussed with nursing, check PVRs 9.  Hyperlipidemia.  Resume Lipitor 10.  Drug-induced constipation  Bowel meds increased on 6/5 with good improvement, decreased on 6/6             Adjust bowel meds as necessary 11.  Prediabetes  Monitor with increased mobility, labs ordered for tomorrow 12.  Elevated blood pressure  ?  Trending up on 6/6, will consider medications if persistent after steroid wean  LOS: 2 days A FACE TO FACE EVALUATION WAS PERFORMED  Ermagene Saidi Lorie Phenix 06/30/2019, 8:11 AM

## 2019-07-01 ENCOUNTER — Inpatient Hospital Stay (HOSPITAL_COMMUNITY): Payer: Medicare HMO

## 2019-07-01 ENCOUNTER — Inpatient Hospital Stay (HOSPITAL_COMMUNITY): Payer: Medicare HMO | Admitting: Occupational Therapy

## 2019-07-01 LAB — COMPREHENSIVE METABOLIC PANEL
ALT: 62 U/L — ABNORMAL HIGH (ref 0–44)
AST: 33 U/L (ref 15–41)
Albumin: 3.3 g/dL — ABNORMAL LOW (ref 3.5–5.0)
Alkaline Phosphatase: 47 U/L (ref 38–126)
Anion gap: 9 (ref 5–15)
BUN: 22 mg/dL (ref 8–23)
CO2: 28 mmol/L (ref 22–32)
Calcium: 8.6 mg/dL — ABNORMAL LOW (ref 8.9–10.3)
Chloride: 100 mmol/L (ref 98–111)
Creatinine, Ser: 0.69 mg/dL (ref 0.61–1.24)
GFR calc Af Amer: >60 mL/min (ref >60)
GFR calc non Af Amer: >60 mL/min (ref >60)
Glucose, Bld: 158 mg/dL — ABNORMAL HIGH (ref 70–99)
Potassium: 5 mmol/L (ref 3.5–5.1)
Sodium: 137 mmol/L (ref 135–145)
Total Bilirubin: 1.1 mg/dL (ref 0.3–1.2)
Total Protein: 5.9 g/dL — ABNORMAL LOW (ref 6.5–8.1)

## 2019-07-01 LAB — URINALYSIS, ROUTINE W REFLEX MICROSCOPIC
Bilirubin Urine: NEGATIVE
Glucose, UA: 50 mg/dL — AB
Hgb urine dipstick: NEGATIVE
Ketones, ur: NEGATIVE mg/dL
Leukocytes,Ua: NEGATIVE
Nitrite: NEGATIVE
Protein, ur: NEGATIVE mg/dL
Specific Gravity, Urine: 1.025 (ref 1.005–1.030)
pH: 5 (ref 5.0–8.0)

## 2019-07-01 LAB — CBC WITH DIFFERENTIAL/PLATELET
Abs Immature Granulocytes: 0.1 10*3/uL — ABNORMAL HIGH (ref 0.00–0.07)
Basophils Absolute: 0 10*3/uL (ref 0.0–0.1)
Basophils Relative: 0 %
Eosinophils Absolute: 0 10*3/uL (ref 0.0–0.5)
Eosinophils Relative: 0 %
HCT: 43.7 % (ref 39.0–52.0)
Hemoglobin: 14.8 g/dL (ref 13.0–17.0)
Immature Granulocytes: 1 %
Lymphocytes Relative: 4 %
Lymphs Abs: 0.5 10*3/uL — ABNORMAL LOW (ref 0.7–4.0)
MCH: 32.7 pg (ref 26.0–34.0)
MCHC: 33.9 g/dL (ref 30.0–36.0)
MCV: 96.5 fL (ref 80.0–100.0)
Monocytes Absolute: 0.8 10*3/uL (ref 0.1–1.0)
Monocytes Relative: 6 %
Neutro Abs: 10.6 10*3/uL — ABNORMAL HIGH (ref 1.7–7.7)
Neutrophils Relative %: 89 %
Platelets: 209 10*3/uL (ref 150–400)
RBC: 4.53 MIL/uL (ref 4.22–5.81)
RDW: 12.6 % (ref 11.5–15.5)
WBC: 12 10*3/uL — ABNORMAL HIGH (ref 4.0–10.5)
nRBC: 0 % (ref 0.0–0.2)

## 2019-07-01 NOTE — Progress Notes (Signed)
Inpatient Rehabilitation  Patient information reviewed and entered into eRehab system by Nethan Caudillo M. Marian Grandt, M.A., CCC/SLP, PPS Coordinator.  Information including medical coding, functional ability and quality indicators will be reviewed and updated through discharge.    

## 2019-07-01 NOTE — Progress Notes (Signed)
Occupational Therapy Session Note  Patient Details  Name: Alan Riley MRN: [id number] Date of Birth: [birth year]  Today's Date: 07/01/2019 OT Individual Time: 1012-1127 OT Individual Time Calculation (min): 75 min    Short Term Goals: Week 1:  OT Short Term Goal 1 (Week 1): STGs=LTGs secondary to estimated short LOS  Skilled Therapeutic Interventions/Progress Updates:    Pt sitting up in w/c with interpreter present to assist with communication throughout session.  Pt agreeable to bathing in shower today and requesting to toilet first.  Pt doffed shoes with max assist.  OT educated pt on use of reacher to doff socks. Pt return demonstrated technique to doff right sock with min assist using reacher.  Pt completed SPT w/c to toilet using grab bars with min assist.  Pt completed pericare with supervision to maintain spinal precautions.  Min assist sit to stand from toilet.  Pt needed CGA to walk to shower bench using RW.  Pt bathed UB with supervision using long handled sponge to wash back with VCs to use care around lower back bandage.  Pt bathed LB using long handled sponge to wash bilateral lower legs and feet and with min assist to stand and wash buttocks.  OT placed towels on floor and ensured completely dry prior to pt completing functional mobility.  Pt performed SPT from shower bench to w/c using RW with CGA.  Pt donned shirt with min assist to maintain spinal precautions.  Educated pt on use of reacher to donn brief and pants with pt needing min assist.  Socks and shoes including right AFO donned with total assist.  Nursing notified of pts need for bandage change and nursing performed with OT present.  Educated pt on use of reacher to pick items off floor for fall prevention and to maintain back precautions.  Pt picked up 3 items off floor using reacher from seated position.  Seat belt donned and call bell in reach at end of session.  Therapy Documentation Precautions:   Precautions Precautions: Fall, Back Required Braces or Orthoses: (no brace) Restrictions Weight Bearing Restrictions: No   Therapy/Group: Individual Therapy  [name] 07/01/2019, 1:35 PM

## 2019-07-01 NOTE — Progress Notes (Signed)
Orthopedic Tech Progress Note Patient Details:  Alan Riley Nov 09, 1945 [id number] Called in patient brace Patient ID: Alan Riley, male   DOB: 1945-04-25, 74 y.o.   MRN: [id number]   [name] 07/01/2019, 7:27 PM

## 2019-07-01 NOTE — Progress Notes (Signed)
Inpatient Rehabilitation Care Coordinator Assessment and Plan  Patient Details  Name: Alan Riley MRN: [id number] Date of Birth: 1945-05-10  Today's Date: 07/01/2019  Problem List:  Patient Active Problem List   Diagnosis Date Noted  . Elevated blood pressure reading   . Radiculopathy 06/28/2019  . Drug induced constipation   . Dyslipidemia   . Urinary retention   . Postoperative pain   . S/P cervical discectomy   . Cervical herniated disc 06/25/2019  . S/P cervical spinal fusion 06/25/2019  . Memory difficulties 02/23/2016  . Right shoulder pain 10/05/2015  . Prediabetes 04/17/2015  . Subclinical hypothyroidism 12/22/2014  . Healthcare maintenance 12/22/2014  . Pure hypercholesterolemia 11/29/2013  . Basal cell carcinoma of brow 11/29/2013  . History of colon cancer, stage III 04/14/2011   Past Medical History:  Past Medical History:  Diagnosis Date  . Basal cell carcinoma of skin 2019   scalp  . HLD (hyperlipidemia)   . Hx of adenomatous colonic polyps   . Iron deficiency anemia secondary to blood loss (chronic)   . Malignant neoplasm of ascending colon ([place]) 2004  . Partial small bowel obstruction ([place]) 12/29/2013  . Uses wheelchair    Past Surgical History:  Past Surgical History:  Procedure Laterality Date  . ANTERIOR CERVICAL DECOMP/DISCECTOMY FUSION N/A 06/25/2019   Procedure: C4-5 ACDF;  Surgeon: [name], MD;  Location: [facility];  Service: Neurosurgery;  Laterality: N/A;  . BACK SURGERY     over 30 yrs ago   . CHOLECYSTECTOMY  1991  . COLONOSCOPY  2013, 2016   x 2  . HEMICOLECTOMY  01/15/03  . LUMBAR LAMINECTOMY/DECOMPRESSION MICRODISCECTOMY Bilateral 06/25/2019   Procedure: L3-4 LUMBAR LAMINECTOMY/DECOMPRESSION MICRODISCECTOMY;  Surgeon: [name], MD;  Location: [facility];  Service: Neurosurgery;  Laterality: Bilateral;   Social History:  reports that he has never smoked. He has never used smokeless tobacco. He reports previous alcohol use. He reports  that he does not use drugs.  Family / Support Systems Marital Status: Married Children: Son 62 ([place]) 2 Dtrs (daughter here from [place]) when dtr returns, son will assir Anticipated Caregiver: Teacher, music and son Ability/Limitations of Caregiver: no Caregiver Availability: 24/7  Social History Preferred language: Product/process development scientist Religion: Non-Denominational Read: Yes Write: Yes Employment Status: Disabled   Abuse/Neglect Abuse/Neglect Assessment Can Be Completed: Yes Physical Abuse: Denies Verbal Abuse: Denies Sexual Abuse: Denies Exploitation of patient/patient's resources: Denies Self-Neglect: Denies  Emotional Status Pt's affect, behavior and adjustment status: no patient reports he is doing well Recent Psychosocial Issues: no Psychiatric History: no Substance Abuse History: no  Patient / Family Perceptions, Expectations & Goals Pt/Family understanding of illness & functional limitations: yes Pt/family expectations/goals: Goal to discharge home with spouse and children to provide care  [employer]: None Premorbid Home Care/DME Agencies: None  Discharge Planning Living Arrangements: Spouse/significant other, Children Support Systems: Spouse/significant other, Children Type of Residence: Private residence(Lives in [place], bedrooms upstairs. 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 Patient super pleasant and is thankful for all staff here. SW will follow up for  any questions/concerns.   Dyanne Iha 07/01/2019, 1:49 PM

## 2019-07-01 NOTE — Progress Notes (Signed)
Inpatient [facility] Individual Statement of Services  Patient Name:  Alan Riley  Date:  07/01/2019  Welcome to the [facility].  Our goal is to provide you with an individualized program based on your diagnosis and situation, designed to meet your specific needs.  With this comprehensive rehabilitation program, you will be expected to participate in at least 3 hours of rehabilitation therapies Monday-Friday, with modified therapy programming on the weekends.  Your rehabilitation program will include the following services:  Physical Therapy (PT), Occupational Therapy (OT), Speech Therapy (ST), 24 hour per day rehabilitation nursing, Therapeutic Recreaction (TR), Neuropsychology, Care Coordinator, Rehabilitation Medicine, Nutrition Services, Pharmacy Services and Other  Weekly team conferences will be held on Wednesdays to discuss your progress.  Your Inpatient Rehabilitation Care Coordinator will talk with you frequently to get your input and to update you on team discussions.  Team conferences with you and your family in attendance may also be held.  Expected length of stay: 10-14 Days  Overall anticipated outcome: MOD I to Supervision  Depending on your progress and recovery, your program may change. Your Inpatient Rehabilitation Care Coordinator will coordinate services and will keep you informed of any changes. Your Inpatient Rehabilitation Care Coordinator's name and contact numbers are listed  below.  The following services may also be recommended but are not provided by the [facility]:    [place] will be made to provide these services after discharge if needed.  Arrangements include referral to agencies that provide these services.  Your insurance has been verified to be:  [employer] Your primary doctor is:  Janie Morning, DO  Pertinent information  will be shared with your doctor and your insurance company.  Inpatient Rehabilitation Care Coordinator:  Erlene Quan, [place] or [phone]  Information discussed with and copy given to patient by: Dyanne Iha, 07/01/2019, 10:15 AM

## 2019-07-01 NOTE — Progress Notes (Signed)
[facility] PHYSICAL MEDICINE & REHABILITATION PROGRESS NOTE  Subjective/Complaints:  Pt reports via interpretor that got Foley out Sunday- voiding well. Had 6-7 BM's yesterday and 1 this AM- feels "cleaned out".   Saturday was first BM since surgery.  Needs R AFO per PT- will order Doesn't feel ill-   ROS:  Pt denies SOB, abd pain, CP, N/V/C/D, and vision changes  Objective: Vital Signs: Blood pressure 119/70, pulse 73, temperature 97.8 F (36.6 C), resp. rate 18, SpO2 98 %. VAS [place] LOWER EXTREMITY VENOUS (DVT)  Result Date: 06/30/2019  Lower Venous DVT Study Indications: Rehabilitation status post spinal surgery.  Comparison Study: No prior study on file for comparison Performing Technologist: Sharion Dove RVS  Examination Guidelines: A complete evaluation includes B-mode imaging, spectral Doppler, color Doppler, and power Doppler as needed of all accessible portions of each vessel. Bilateral testing is considered an integral part of a complete examination. Limited examinations for reoccurring indications may be performed as noted. The reflux portion of the exam is performed with the patient in reverse Trendelenburg.  +---------+---------------+---------+-----------+----------+--------------+ RIGHT    CompressibilityPhasicitySpontaneityPropertiesThrombus Aging +---------+---------------+---------+-----------+----------+--------------+ CFV      Full           Yes      Yes                                 +---------+---------------+---------+-----------+----------+--------------+ SFJ      Full                                                        +---------+---------------+---------+-----------+----------+--------------+ FV Prox  Full                                                        +---------+---------------+---------+-----------+----------+--------------+ FV Mid   Full                                                         +---------+---------------+---------+-----------+----------+--------------+ FV DistalFull                                                        +---------+---------------+---------+-----------+----------+--------------+ PFV      Full                                                        +---------+---------------+---------+-----------+----------+--------------+ POP      Full           Yes      Yes                                 +---------+---------------+---------+-----------+----------+--------------+  PTV      Full                                                        +---------+---------------+---------+-----------+----------+--------------+ PERO     Full                                                        +---------+---------------+---------+-----------+----------+--------------+   +---------+---------------+---------+-----------+----------+--------------+ LEFT     CompressibilityPhasicitySpontaneityPropertiesThrombus Aging +---------+---------------+---------+-----------+----------+--------------+ CFV      Full           Yes      Yes                                 +---------+---------------+---------+-----------+----------+--------------+ SFJ      Full                                                        +---------+---------------+---------+-----------+----------+--------------+ FV Prox  Full                                                        +---------+---------------+---------+-----------+----------+--------------+ FV Mid   Full                                                        +---------+---------------+---------+-----------+----------+--------------+ FV DistalFull                                                        +---------+---------------+---------+-----------+----------+--------------+ PFV      Full                                                         +---------+---------------+---------+-----------+----------+--------------+ POP      Full           Yes      Yes                                 +---------+---------------+---------+-----------+----------+--------------+ PTV      Full                                                        +---------+---------------+---------+-----------+----------+--------------+  PERO     Full                                                        +---------+---------------+---------+-----------+----------+--------------+     Summary: BILATERAL: - No evidence of deep vein thrombosis seen in the lower extremities, bilaterally. -   *See table(s) above for measurements and observations. Electronically signed by Ruta Hinds MD on 06/30/2019 at 7:11:25 PM.    Final    Recent Labs    07/01/19 0625  WBC 12.0*  HGB 14.8  HCT 43.7  PLT 209   Recent Labs    07/01/19 0625  NA 137  K 5.0  CL 100  CO2 28  GLUCOSE 158*  BUN 22  CREATININE 0.69  CALCIUM 8.6*    Physical Exam: BP 119/70 (BP Location: Left Arm)   Pulse 73   Temp 97.8 F (36.6 C)   Resp 18   SpO2 98%  Constitutional: No distress . Vital signs reviewed. Went to bathroom, then sitting in w/c- with PT and interpretor in room, NAD HENT: Normocephalic.  Atraumatic. Eyes: conjugate gaze Cardiovascular: RRR Respiratory: CTA B/L- no W/R/R GI: Soft, NT, ND, (+)BS  Skin: Surgical site C/D/I- unchanged Psych: appropriate Musc: No edema in extremities.  No tenderness in extremities. Neurological: Alert Motor: Bilateral upper extremities: 5/5 proximal distal, unchanged Right lower extremity: Hip flexion, knee extension 4+/5, ankle dorsiflexion 0/5 , stable  Assessment/Plan: 1. Functional deficits secondary to radiculopathy which require 3+ hours per day of interdisciplinary therapy in a comprehensive inpatient rehab setting.  Physiatrist is providing close team supervision and 24 hour management of active medical problems listed  below.  Physiatrist and rehab team continue to assess barriers to discharge/monitor patient progress toward functional and medical goals  Care Tool:  Bathing    Body parts bathed by patient: Right arm, Left arm, Chest, Abdomen, Front perineal area, Right upper leg, Left upper leg, Right lower leg, Left lower leg, Face   Body parts bathed by helper: Buttocks     Bathing assist Assist Level: Minimal Assistance - Patient > 75%(using long handled sponge)     Upper Body Dressing/Undressing Upper body dressing   What is the patient wearing?: Pull over shirt    Upper body assist Assist Level: Minimal Assistance - Patient > 75%    Lower Body Dressing/Undressing Lower body dressing      What is the patient wearing?: Pants, Incontinence brief     Lower body assist Assist for lower body dressing: Minimal Assistance - Patient > 75%(using reacher)     Toileting Toileting    Toileting assist Assist for toileting: Supervision/Verbal cueing     Transfers Chair/bed transfer  Transfers assist     Chair/bed transfer assist level: Minimal Assistance - Patient > 75%     Locomotion Ambulation   Ambulation assist      Assist level: Minimal Assistance - Patient > 75% Assistive device: Walker-rolling Max distance: 120   Walk 10 feet activity   Assist  Walk 10 feet activity did not occur: Safety/medical concerns  Assist level: Minimal Assistance - Patient > 75% Assistive device: Walker-rolling   Walk 50 feet activity   Assist Walk 50 feet with 2 turns activity did not occur: Safety/medical concerns  Assist level: Minimal Assistance - Patient > 75% Assistive device: Walker-rolling  Walk 150 feet activity   Assist Walk 150 feet activity did not occur: Safety/medical concerns         Walk 10 feet on uneven surface  activity   Assist Walk 10 feet on uneven surfaces activity did not occur: Safety/medical concerns         Wheelchair     Assist    Type of Wheelchair: Manual    Wheelchair assist level: Supervision/Verbal cueing Max wheelchair distance: 150    Wheelchair 50 feet with 2 turns activity    Assist        Assist Level: Supervision/Verbal cueing   Wheelchair 150 feet activity     Assist     Assist Level: Supervision/Verbal cueing      Medical Problem List and Plan: 1.  Gait disturbance secondary to lumbar cervical stenosis with radiculopathy.S/P decompressive anterior cervical discectomy C4-5 with arthrodesis and lumbar laminectomy with foraminotomies L3-4 with discectomy 06/25/2019.  No brace required  Continue CIR  PRAFO ordered, pending, discussed with nursing  Plan to wean steroids 2.  Antithrombotics: -DVT/anticoagulation: SCDs.    Vascular study pending  6/7- is (-)             -antiplatelet therapy: N/A 3. Pain Management: Oxycodone and Robaxin as needed  Controlled on 6/6             Monitor with increased exertion 4. Mood: Provide emotional support             -antipsychotic agents: N/A 5. Neuropsych: This patient is capable of making decisions on his own behalf. 6. Skin/Wound Care: Routine skin checks 7. Fluids/Electrolytes/Nutrition: Routine in and outs.    CMP ordered for tomorrow 8.  Urinary retention.  Flomax 0.4 mg daily.    DCed Foley, however remains in place, discussed with nursing, check PVRs  6/7- voiding well- will check U/A and Cx since has Increased WBC 12k from 8.4k 9.  Hyperlipidemia.  Resume Lipitor 10.  Drug-induced constipation  Bowel meds increased on 6/5 with good improvement, decreased on 6/6  6/7- sounds like cleaned out- not water- just somewhat loose             Adjust bowel meds as necessary 11.  Prediabetes  Monitor with increased mobility, labs ordered for tomorrow 12.  Elevated blood pressure  ?  Trending up on 6/6, will consider medications if persistent after steroid wean 13. Leukocytosis  6/7- ordered CBC in AM, U/A and Cx to see if has UTI ??? Is  afebrile and feels good.     LOS: 3 days A FACE TO FACE EVALUATION WAS PERFORMED  Samra Pesch 07/01/2019, 6:02 PM

## 2019-07-01 NOTE — Progress Notes (Signed)
Occupational Therapy Session Note  Patient Details  Name: Alan Riley MRN: [id number] Date of Birth: 28-Dec-1945  Today's Date: 07/01/2019 OT Individual Time: 1300-1413 OT Individual Time Calculation (min): 73 min    Short Term Goals: Week 1:  OT Short Term Goal 1 (Week 1): STGs=LTGs secondary to estimated short LOS  Skilled Therapeutic Interventions/Progress Updates:    Pt received sitting up in the w/c with no c/o pain. Interpretor present throughout session. Pt requesting to use the bathroom. Pt used RW to complete functional mobility into the bathroom with min A overall. Pt completed toileting with CGA once standing, requiring mod A to power up from low toilet. Pt completed w/c propulsion to the therapy gym with slow pace but (S) overall. Pt completed 25 ft of functional mobility with min A with cueing for increasing BOS. Pt completed mini squats 3x 6 repetitions holding a 1 kg medicine ball, to increase standing balance and quad/glute strength. Pt then transferred into the parallel bars and completed B leg lifts to increase glute med strength/control. Pt stood and completed BUE coordination with ball, throwing/catching activity without UE support to promote increased standing balance/righting reactions 3x 20 repetitions. Pt then completed catching/throwing activity with a ball with mat height raised to remove BLE support on the floor 3x20 repetitions. Pt completed anther 50 ft of functional mobility with min A using RW. Pt was left sitting up in the w/c with all needs within reach, chair alarm set.   Therapy Documentation Precautions:  Precautions Precautions: Fall, Back Required Braces or Orthoses: (no brace) Restrictions Weight Bearing Restrictions: No  Therapy/Group: Individual Therapy  Curtis Sites 07/01/2019, 6:46 AM

## 2019-07-01 NOTE — Progress Notes (Signed)
Physical Therapy Session Note  Patient Details  Name: Alan Riley MRN: [id number] Date of Birth: 1945-05-29  Today's Date: 07/01/2019 PT Individual Time: 0800-0900 PT Individual Time Calculation (min): 60 min   Short Term Goals: Week 1:  PT Short Term Goal 1 (Week 1): STG=LTG due to ELOS Week 2:    Week 3:     Skilled Therapeutic Interventions/Progress Updates:     PAIN denies pain this am   Pt initially supine and agreeable to session.  Interpreter not arrived, but pt clearly communicates need for BR.  Supine to sit w/supervision.  STS w/min assist and short distance gait x 42ft w/RW and min assist.  Commode transfer w/min assist and cues for safety/hand placement.  Pt continent of bowels. STS from commode w/min assist and cues, pt performs perihygiene w min assist for balance only. Gait 18ft w/RW to wc w/min assist, cues for posture, steppage gait R w/footdrop and w/excessive stride length, decreased base of support. Pt transferred to wc w/min assist and RW.  Translator arrived and MD in room.  Discussed need for AFO and Dr. Arma Heading addressing orders.  Contacted Orthotist via text/awaiting reply.   Pt washes hands from wc level independently.  wc propulsion x 176ft w/bilat UEs and supervision only  Pt fitted w/trial AFO for gait training, educated pt re:  Recommendations for use/safety/process of obtaining/plan to contact orthotist.  Pt agreed/understood.  Gait 22ft w/RW, min assist, decreased base of support, excessive stride length, flexed posture. Instructed pt w/normal step length, step thru gait, upright posture and increasing base of support.   Repeated gait x 39ft w/significantly improve step length and posture, min to cga.  Balance and strengthening activities: Standing in parallel bars without UE support Static balance - increased AP sway initially but stabilizes self over 8-10 sec. Standing single arm raise, alternating progressing to bilat UE arm raise, cga,  increased sway but no LOB  Standing w/UE support marching x 5 each then repeated all balance activities above w/cga to min assist and increased sway due to fatigue.  Standing sidestepping length of bars x 4 w/cues to isolate hip abd/add  wc propulsion 173ft w/bilat UEs including turning, assist by therapist to back into room.  Pt left oob in wc w/alarm belt set and needs in reach   Therapy Documentation Precautions:  Precautions Precautions: Fall, Back Required Braces or Orthoses: (no brace) Restrictions Weight Bearing Restrictions: No    Therapy/Group: Individual Therapy  Callie Fielding, [place] 07/01/2019, 1:01 PM

## 2019-07-02 ENCOUNTER — Inpatient Hospital Stay (HOSPITAL_COMMUNITY): Payer: Medicare HMO | Admitting: Occupational Therapy

## 2019-07-02 ENCOUNTER — Inpatient Hospital Stay (HOSPITAL_COMMUNITY): Payer: Medicare HMO

## 2019-07-02 DIAGNOSIS — D72829 Elevated white blood cell count, unspecified: Secondary | ICD-10-CM

## 2019-07-02 DIAGNOSIS — D72828 Other elevated white blood cell count: Secondary | ICD-10-CM

## 2019-07-02 LAB — CBC WITH DIFFERENTIAL/PLATELET
Abs Immature Granulocytes: 0.16 10*3/uL — ABNORMAL HIGH (ref 0.00–0.07)
Basophils Absolute: 0 10*3/uL (ref 0.0–0.1)
Basophils Relative: 0 %
Eosinophils Absolute: 0 10*3/uL (ref 0.0–0.5)
Eosinophils Relative: 0 %
HCT: 44.8 % (ref 39.0–52.0)
Hemoglobin: 15.1 g/dL (ref 13.0–17.0)
Immature Granulocytes: 1 %
Lymphocytes Relative: 4 %
Lymphs Abs: 0.5 10*3/uL — ABNORMAL LOW (ref 0.7–4.0)
MCH: 32.3 pg (ref 26.0–34.0)
MCHC: 33.7 g/dL (ref 30.0–36.0)
MCV: 95.9 fL (ref 80.0–100.0)
Monocytes Absolute: 0.6 10*3/uL (ref 0.1–1.0)
Monocytes Relative: 5 %
Neutro Abs: 11.6 10*3/uL — ABNORMAL HIGH (ref 1.7–7.7)
Neutrophils Relative %: 90 %
Platelets: 209 10*3/uL (ref 150–400)
RBC: 4.67 MIL/uL (ref 4.22–5.81)
RDW: 12.5 % (ref 11.5–15.5)
WBC: 13 10*3/uL — ABNORMAL HIGH (ref 4.0–10.5)
nRBC: 0 % (ref 0.0–0.2)

## 2019-07-02 LAB — URINE CULTURE: Culture: NO GROWTH

## 2019-07-02 MED ORDER — DEXAMETHASONE 4 MG PO TABS
4.0000 mg | ORAL_TABLET | Freq: Two times a day (BID) | ORAL | Status: DC
  Administered 2019-07-02 – 2019-07-04 (×5): 4 mg via ORAL
  Filled 2019-07-02 (×6): qty 1

## 2019-07-02 MED ORDER — DEXAMETHASONE SODIUM PHOSPHATE 4 MG/ML IJ SOLN
4.0000 mg | Freq: Two times a day (BID) | INTRAMUSCULAR | Status: DC
  Filled 2019-07-02 (×6): qty 1

## 2019-07-02 NOTE — Progress Notes (Signed)
[facility] PHYSICAL MEDICINE & REHABILITATION PROGRESS NOTE  Subjective/Complaints: Patient seen sitting up in bed this morning.  He indicates that he slept well overnight. Still no PRAFO in room, discussed with nursing again.  ROS: Limited due to language  Objective: Vital Signs: Blood pressure 130/61, pulse (!) 51, temperature 97.8 F (36.6 C), temperature source Oral, resp. rate 18, SpO2 98 %. VAS [place] LOWER EXTREMITY VENOUS (DVT)  Result Date: 06/30/2019  Lower Venous DVT Study Indications: Rehabilitation status post spinal surgery.  Comparison Study: No prior study on file for comparison Performing Technologist: Sharion Dove RVS  Examination Guidelines: A complete evaluation includes B-mode imaging, spectral Doppler, color Doppler, and power Doppler as needed of all accessible portions of each vessel. Bilateral testing is considered an integral part of a complete examination. Limited examinations for reoccurring indications may be performed as noted. The reflux portion of the exam is performed with the patient in reverse Trendelenburg.  +---------+---------------+---------+-----------+----------+--------------+ RIGHT    CompressibilityPhasicitySpontaneityPropertiesThrombus Aging +---------+---------------+---------+-----------+----------+--------------+ CFV      Full           Yes      Yes                                 +---------+---------------+---------+-----------+----------+--------------+ SFJ      Full                                                        +---------+---------------+---------+-----------+----------+--------------+ FV Prox  Full                                                        +---------+---------------+---------+-----------+----------+--------------+ FV Mid   Full                                                        +---------+---------------+---------+-----------+----------+--------------+ FV DistalFull                                                         +---------+---------------+---------+-----------+----------+--------------+ PFV      Full                                                        +---------+---------------+---------+-----------+----------+--------------+ POP      Full           Yes      Yes                                 +---------+---------------+---------+-----------+----------+--------------+ PTV      Full                                                        +---------+---------------+---------+-----------+----------+--------------+  PERO     Full                                                        +---------+---------------+---------+-----------+----------+--------------+   +---------+---------------+---------+-----------+----------+--------------+ LEFT     CompressibilityPhasicitySpontaneityPropertiesThrombus Aging +---------+---------------+---------+-----------+----------+--------------+ CFV      Full           Yes      Yes                                 +---------+---------------+---------+-----------+----------+--------------+ SFJ      Full                                                        +---------+---------------+---------+-----------+----------+--------------+ FV Prox  Full                                                        +---------+---------------+---------+-----------+----------+--------------+ FV Mid   Full                                                        +---------+---------------+---------+-----------+----------+--------------+ FV DistalFull                                                        +---------+---------------+---------+-----------+----------+--------------+ PFV      Full                                                        +---------+---------------+---------+-----------+----------+--------------+ POP      Full           Yes      Yes                                  +---------+---------------+---------+-----------+----------+--------------+ PTV      Full                                                        +---------+---------------+---------+-----------+----------+--------------+ PERO     Full                                                        +---------+---------------+---------+-----------+----------+--------------+  Summary: BILATERAL: - No evidence of deep vein thrombosis seen in the lower extremities, bilaterally. -   *See table(s) above for measurements and observations. Electronically signed by Ruta Hinds MD on 06/30/2019 at 7:11:25 PM.    Final    Recent Labs    07/01/19 0625  WBC 12.0*  HGB 14.8  HCT 43.7  PLT 209   Recent Labs    07/01/19 0625  NA 137  K 5.0  CL 100  CO2 28  GLUCOSE 158*  BUN 22  CREATININE 0.69  CALCIUM 8.6*    Physical Exam: BP 130/61 (BP Location: Right Arm)   Pulse (!) 51   Temp 97.8 F (36.6 C) (Oral)   Resp 18   SpO2 98%  Constitutional: No distress . Vital signs reviewed. HENT: Normocephalic.  Atraumatic. Eyes: EOMI. No discharge. Cardiovascular: No JVD. Respiratory: Normal effort.  No stridor. GI: Non-distended. Skin: Surgical site C/D/I Psych: Normal mood.  Normal behavior. Musc: No edema in extremities.  No tenderness in extremities. Neurological: Alert Motor: Left lower extremity: 4+/5 proximal to distal Right lower extremity: Hip flexion, knee extension 4/5, ankle dorsiflexion 0/5, unchanged  Assessment/Plan: 1. Functional deficits secondary to radiculopathy which require 3+ hours per day of interdisciplinary therapy in a comprehensive inpatient rehab setting.  Physiatrist is providing close team supervision and 24 hour management of active medical problems listed below.  Physiatrist and rehab team continue to assess barriers to discharge/monitor patient progress toward functional and medical goals  Care Tool:  Bathing    Body parts bathed by patient: Right  arm, Left arm, Chest, Abdomen, Front perineal area, Right upper leg, Left upper leg, Right lower leg, Left lower leg, Face   Body parts bathed by helper: Buttocks     Bathing assist Assist Level: Minimal Assistance - Patient > 75%(using long handled sponge)     Upper Body Dressing/Undressing Upper body dressing   What is the patient wearing?: Pull over shirt    Upper body assist Assist Level: Minimal Assistance - Patient > 75%    Lower Body Dressing/Undressing Lower body dressing      What is the patient wearing?: Incontinence brief, Pants     Lower body assist Assist for lower body dressing: Minimal Assistance - Patient > 75%(using reacher)     Toileting Toileting    Toileting assist Assist for toileting: Supervision/Verbal cueing     Transfers Chair/bed transfer  Transfers assist     Chair/bed transfer assist level: Minimal Assistance - Patient > 75%     Locomotion Ambulation   Ambulation assist      Assist level: Minimal Assistance - Patient > 75% Assistive device: Walker-rolling Max distance: 120   Walk 10 feet activity   Assist  Walk 10 feet activity did not occur: Safety/medical concerns  Assist level: Minimal Assistance - Patient > 75% Assistive device: Walker-rolling   Walk 50 feet activity   Assist Walk 50 feet with 2 turns activity did not occur: Safety/medical concerns  Assist level: Minimal Assistance - Patient > 75% Assistive device: Walker-rolling    Walk 150 feet activity   Assist Walk 150 feet activity did not occur: Safety/medical concerns         Walk 10 feet on uneven surface  activity   Assist Walk 10 feet on uneven surfaces activity did not occur: Safety/medical concerns         Wheelchair     Assist   Type of Wheelchair: Manual    Wheelchair assist level: Supervision/Verbal  cueing Max wheelchair distance: 150    Wheelchair 50 feet with 2 turns activity    Assist        Assist Level:  Supervision/Verbal cueing   Wheelchair 150 feet activity     Assist     Assist Level: Supervision/Verbal cueing      Medical Problem List and Plan: 1.  Gait disturbance secondary to lumbar cervical stenosis with radiculopathy.S/P decompressive anterior cervical discectomy C4-5 with arthrodesis and lumbar laminectomy with foraminotomies L3-4 with discectomy 06/25/2019.  No brace required  Continue CIR  PRAFO ordered, pending, discussed with nursing again  Weaning steroids 2.  Antithrombotics: -DVT/anticoagulation: SCDs.    Vascular study negative for DVT             -antiplatelet therapy: N/A 3. Pain Management: Oxycodone and Robaxin as needed  Controlled on 6/8             Monitor with increased exertion 4. Mood: Provide emotional support             -antipsychotic agents: N/A 5. Neuropsych: This patient is capable of making decisions on his own behalf. 6. Skin/Wound Care: Routine skin checks 7. Fluids/Electrolytes/Nutrition: Routine in and outs.   8.  Urinary retention.  Flomax 0.4 mg daily.    Foley DC'd  PVRs not completed, but appears to be voiding without difficulty  UA negative, urine culture pending 9.  Hyperlipidemia.  Resume Lipitor 10.  Drug-induced constipation  Bowel meds increased on 6/5 with good improvement, decreased on 6/6  Improving             Adjust bowel meds as necessary 11.  Prediabetes  Elevated on 6/7  Continue to monitor 12.  Elevated blood pressure  Relatively controlled on 6/8 13. Leukocytosis-likely secondary to steroids  See #8  WBC 13.0 on 6/8  Afebrile  Continue to monitor    LOS: 4 days A FACE TO FACE EVALUATION WAS PERFORMED  Zilda No Lorie Phenix 07/02/2019, 8:23 AM

## 2019-07-02 NOTE — Progress Notes (Signed)
Orthopedic Tech Progress Note Patient Details:  Alan Riley [birth year] [id number] RN called requesting a WALKING PRAFO BOOT so I called in order to [facility] the ones in house aren't meant for walking on Patient ID: Alan Riley, male   DOB: 29-Mar-1945, 74 y.o.   MRN: [id number]   Alan Riley 07/02/2019, 12:39 PM

## 2019-07-02 NOTE — Progress Notes (Signed)
Occupational Therapy Session Note  Patient Details  Name: Magic Mohler MRN: [id number] Date of Birth: 1945/05/01  Today's Date: 07/02/2019 OT Individual Time:  -       Short Term Goals: Week 1:  OT Short Term Goal 1 (Week 1): STGs=LTGs secondary to estimated short LOS  Skilled Therapeutic Interventions/Progress Updates:    Pt supine in bed with interpreter present to assist with communication throughout session.  Pt reports he already washed and dressed this morning. Pt with c/o gas like feeling in stomach at beginning of session however does not feel the urge to have a bowel movement at this time.  Socks and shoes donned with max assist due to no sock aide available to trial with.  Pt reports he has a long handled shoe horn at home to assist with donning of shoes.  Pt completed sit to stand at [facility] and short distance ambulation EOB to w/c with supervision.  Pt self propelled approximately 30 feet with initial education on w/c management and independently thereafter.  Discussed pts bathroom layout and pt describes tub shower being upstairs with one grab bar on inner wall with no chair or bench.  Pt educated on safe transfer technique using bench and then chair with visual demonstration provided.  Pt completed shower bench transfer with CGA.  Pt attempted shower chair transfer however he was unable to lift RLE over tub lip and therefore was discontinued.  Pt reports his wife has been pouring water on him and washing him downstairs in half bath sitting in chair on tile floor.  OT educated pt on safety hazard potential of bathing in this context secondary to no drainage available and will put him and his wife at increase risk for slip and fall.  OT recommended pt to complete sponge bath at sinkside until able to safely use tub shower on second floor. Pt with PT upon OT departure.  Therapy Documentation Precautions:  Precautions Precautions: Fall, Back Required Braces or Orthoses: (no  brace) Restrictions Weight Bearing Restrictions: No   Pain: Pain Assessment Pain Scale: 0-10 Pain Score: 4  Faces Pain Scale: Hurts a little bit Pain Type: Acute pain Pain Location: Incision   Therapy/Group: Individual Therapy  [name] 07/02/2019, 12:30 PM

## 2019-07-02 NOTE — Progress Notes (Addendum)
Physical Therapy Session Note  Patient Details  Name: Alan Riley MRN: [id number] Date of Birth: May 26, 1945  Today's Date: 07/02/2019 PT Individual Time: 1300-1415 PT Individual Time Calculation (min): 75 min   Short Term Goals: Week 1:  PT Short Term Goal 1 (Week 1): STG=LTG due to ELOS Week 2:    Week 3:     Skilled Therapeutic Interventions/Progress Updates: session performed w/interpreter full session   PAIN Denies pain this am, states he feels "worked out" following OT session and prior days of IPR.  Agreeable to treatment.  Received/hand off from OT.  Pt transported to hallway for gait training.  Reviewed points learned yesterday including maintaining upright posture, safe distance within walker, avoiding scissoring, equal and normal step length.  Pt already wearing AFO/shoes.  STS from wc w/cga.  Gait 155ft w/RW w/cga w/significantly improved posture, step length, step width.  Rested several min in sitting, again stated he was fatigued today.  STS from armchair w/cga and repeated gait as above including turning and backing to wc w/cga. Pt then propels into gym x 66ft w/supervision.    STS from wc without AD w/min to mod assist and moderate post tendency in standing. Cues for upright posture, able to wt shift w/cues/min assist.  SPT to mat w/mod assist.  Attempted alt tapping of 3 inch step but severe scissoring of RLE occurs w/task likely due to difficulty/increased stress w/this task.  Pt then performed repeated STS w/1lb wt ball x 8 reps, post tendency but maintains balance via maintaintin contact post legs against mat.  Attempted hip abd strengthening w/orange TB resistance but too difficult due to weakness.  Sit to supine w/moderate cues for maintaining spinal precausions, min assist for le mgmt.  In supine performed clamshells w/orange theraband resistance 2 x15 Bridging also w/resistance to hip abd x 8, very challenging for pt/fatigues quickly.  Supine to side to  sit w/cues for sequencing, cga   SPT using RW mat to wc w/cga.  Propels wc back to room mod I including backing wc next to bed. Pt left oob in wc w/alarm belt set and needs in reach  [place] interpreter present for full session PAIN Denies pain this pm  Pt initially OOB in wc finishing lunch.  Propels wc x >130ft mod I.  Daughter arrived and therapist discussed treatment/goals/current level of function as well as scheduling for family training sessions.    Pt STS from wc w/cga and gait x 191ft including 2 turns w/cga, cues for posture, minimal cues for step length, mild instability at R hip/knee w/fatigue.  Pt transported to day room for cardiovascular endurance activity.  Introduced to [employer] and pt wc to [employer] w/RW and cga.  Set up required then pt performs NuStep x 10 min L3 at RPE 5/10.  Rested several min then STS and SPT to wc using RW w/cga.  Pt transported to main gym for balance/strengthening: wc propulsion x 26ft w/bilat le and occasional min assist for HS strengtheing. STS in parallel bars w/cga Static stand w/eyes open/eyes closed - mild sway eyes open, increased sway eyes closed/min assist x 10 count repeated x 2  Gait/retrogait x 8 ft in parallel bars w/single UE support cga gait/min assist retrogait and difficulty w/placement of RLE w/retro  Sidestepping length of bars x 6, rest in sitting, repeated x 6 w/verbal cues to prevent ER of R hip as compensation, bilat UE support, mirror for feedback.  At end of session pt transported to room by PT tech  and left oob in wc w/alarm set and needs in reach.    Therapy Documentation Precautions:  Precautions Precautions: Fall, Back Required Braces or Orthoses: (no brace) Restrictions Weight Bearing Restrictions: No    Therapy/Group: Individual Therapy  Callie Fielding, PT   Jerrilyn Cairo 07/02/2019, 4:39 PM

## 2019-07-03 ENCOUNTER — Inpatient Hospital Stay (HOSPITAL_COMMUNITY): Payer: Medicare HMO | Admitting: Physical Therapy

## 2019-07-03 ENCOUNTER — Inpatient Hospital Stay (HOSPITAL_COMMUNITY): Payer: Medicare HMO | Admitting: Occupational Therapy

## 2019-07-03 DIAGNOSIS — R0989 Other specified symptoms and signs involving the circulatory and respiratory systems: Secondary | ICD-10-CM

## 2019-07-03 DIAGNOSIS — R739 Hyperglycemia, unspecified: Secondary | ICD-10-CM

## 2019-07-03 DIAGNOSIS — T380X5A Adverse effect of glucocorticoids and synthetic analogues, initial encounter: Secondary | ICD-10-CM

## 2019-07-03 NOTE — Patient Care Conference (Signed)
Inpatient RehabilitationTeam Conference and Plan of Care Update Date: 07/03/2019   Time: 4:28 PM    Patient Name: Alan Riley      Medical Record Number: [id number]  Date of Birth: 1945-03-10 Sex: Male         Room/Bed: 4M05C/4M05C-01 Payor Info: Payor: [employer] MEDICARE / Plan: [place] HMO / Product Type: *No Product type* /    Admit Date/Time:  06/28/2019  5:09 PM  Primary Diagnosis:  Radiculopathy  Patient Active Problem List   Diagnosis Date Noted  . Labile blood pressure   . Steroid-induced hyperglycemia   . Leucocytosis   . Elevated blood pressure reading   . Radiculopathy 06/28/2019  . Drug induced constipation   . Dyslipidemia   . Urinary retention   . Postoperative pain   . S/P cervical discectomy   . Cervical herniated disc 06/25/2019  . S/P cervical spinal fusion 06/25/2019  . Memory difficulties 02/23/2016  . Right shoulder pain 10/05/2015  . Prediabetes 04/17/2015  . Subclinical hypothyroidism 12/22/2014  . Healthcare maintenance 12/22/2014  . Pure hypercholesterolemia 11/29/2013  . Basal cell carcinoma of brow 11/29/2013  . History of colon cancer, stage III 04/14/2011    Expected Discharge Date: Expected Discharge Date: 07/09/19  Team Members Present: Physician leading conference: Dr. Delice Lesch Care Coodinator Present: Nestor Lewandowsky, RN, BSN, CRRN;Christina Sampson Goon, [place] Nurse Present: Other (comment)(Taryn Laurance Flatten, RN) PT Present: Deniece Ree, PT OT Present: Other (comment)(Bonnie Irish Elders, OT) PPS Coordinator present : Ileana Ladd, PT     Current Status/Progress Goal Weekly Team Focus  Bowel/Bladder   Patient is continent of bowel and bladder but he does have problems ambulating to the restroom which can cause incontinence at times.  Improve mobility so he won't have any incontinent episodes.      Swallow/Nutrition/ Hydration   No swallowing difficulties.  Goals met.      ADL's   x1 assist with bathing, dressing and footwear. X1  assist with toileting .  Minimal assist to independent with ADLs.  AE use during dressing and bathing, shower and toilet transfer training, spinal precaution education   Mobility   Lower extremity weakness d/t spinal surgery which causes impaired mobility.  Independent with increased strength, endurance and gait.  strength, gait, endurance   Communication   Language barrier because the patient speaks little [place] and requires an interpreter.  Have interpreter available as needed.      Safety/Cognition/ Behavioral Observations  Bed and chair alarms when OOB. Patient is impulsive and will try to ambulate alone.  No chair alarms and patient more aware of safety goals.      Pain   Minimal complaints of pain. Incisional or stomach pain.  Pain < 2      Skin   Skin, warm dry and WNL. Incision intact.  Remain infection free and complete wound healing.       Rehab Goals Patient on target to meet rehab goals: Yes Rehab Goals Revised: Patient on target with current goals *See Care Plan and progress notes for long and short-term goals.     Barriers to Discharge  Current Status/Progress Possible Resolutions Date Resolved   Nursing                  PT  Inaccessible home environment;Decreased caregiver support;Home environment access/layout;Insurance for SNF coverage                 OT  SLP                Care Coordinator                Discharge Planning/Teaching Needs:  Patient plans to discharge home  Will schedule education if needed   Team Discussion:  Making progress, on target for discharge  Revisions to Treatment Plan:  None    Medical Summary Current Status: Gait disturbance secondary to lumbar cervical stenosis with radiculopathy.S/P decompressive anterior cervical discectomy C4-5 with arthrodesis and lumbar laminectomy with foraminotomies L3-4 with discectomy 06/25/2019. Weekly Focus/Goal: Improve mobility, transfers, pain, hyperglycemia, elevated BP,  leukocytosis  Barriers to Discharge: Medical stability   Possible Resolutions to Barriers: Therapies, follow labs, follow CBGs with steroid wean, steroid wean   Continued Need for Acute Rehabilitation Level of Care: The patient requires daily medical management by a physician with specialized training in physical medicine and rehabilitation for the following reasons: Direction of a multidisciplinary physical rehabilitation program to maximize functional independence : Yes Medical management of patient stability for increased activity during participation in an intensive rehabilitation regime.: Yes Analysis of laboratory values and/or radiology reports with any subsequent need for medication adjustment and/or medical intervention. : Yes   I attest that I was present, lead the team conference, and concur with the assessment and plan of the team.   Dorien Chihuahua B 07/03/2019, 4:28 PM

## 2019-07-03 NOTE — Progress Notes (Signed)
Patient ID: Alan Riley, male   DOB: 31-Jan-1945, 74 y.o.   MRN: [id number]   Team Conference Report to Patient/Family  Team Conference discussion was reviewed with the patient and caregiver, including goals, any changes in plan of care and target discharge date.  Patient and caregiver express understanding and are in agreement.  The patient has a target discharge date of 07/09/19. Daughter available for family education Sunday, June 13th 8AM-12PM  Dyanne Iha 07/03/2019, 1:31 PM

## 2019-07-03 NOTE — Progress Notes (Signed)
[place] PHYSICAL MEDICINE & REHABILITATION PROGRESS NOTE  Subjective/Complaints: Patient seen sitting up in bed this morning.  He indicates that he slept fairly overnight.  His PRAFO is still in its packaging, discussed with nursing again.  ROS: Limited due to language  Objective: Vital Signs: Blood pressure (!) 162/72, pulse 62, temperature 98 F (36.7 C), resp. rate 17, SpO2 94 %. No results found. Recent Labs    07/01/19 0625 07/02/19 0636  WBC 12.0* 13.0*  HGB 14.8 15.1  HCT 43.7 44.8  PLT 209 209   Recent Labs    07/01/19 0625  NA 137  K 5.0  CL 100  CO2 28  GLUCOSE 158*  BUN 22  CREATININE 0.69  CALCIUM 8.6*    Physical Exam: BP (!) 162/72   Pulse 62   Temp 98 F (36.7 C)   Resp 17   SpO2 94%  Constitutional: No distress . Vital signs reviewed. HENT: Normocephalic.  Atraumatic. Eyes: EOMI. No discharge. Cardiovascular: No JVD. Respiratory: Normal effort.  No stridor. GI: Non-distended. Skin: Surgical site C/D/I Psych: Normal mood.  Normal behavior. Musc: No edema in extremities.  No tenderness in extremities. Neurological: Alert Motor: Left lower extremity: 4+/5 proximal to distal Right lower extremity: Hip flexion, knee extension 4/5, ankle dorsiflexion 0/5, stable  Assessment/Plan: 1. Functional deficits secondary to radiculopathy which require 3+ hours per day of interdisciplinary therapy in a comprehensive inpatient rehab setting.  Physiatrist is providing close team supervision and 24 hour management of active medical problems listed below.  Physiatrist and rehab team continue to assess barriers to discharge/monitor patient progress toward functional and medical goals  Care Tool:  Bathing    Body parts bathed by patient: Right arm, Left arm, Chest, Abdomen, Front perineal area, Right upper leg, Left upper leg, Right lower leg, Left lower leg, Face   Body parts bathed by helper: Buttocks     Bathing assist Assist Level: Minimal  Assistance - Patient > 75%(using long handled sponge)     Upper Body Dressing/Undressing Upper body dressing   What is the patient wearing?: Pull over shirt    Upper body assist Assist Level: Minimal Assistance - Patient > 75%    Lower Body Dressing/Undressing Lower body dressing      What is the patient wearing?: Incontinence brief, Pants     Lower body assist Assist for lower body dressing: Minimal Assistance - Patient > 75%(using reacher)     Toileting Toileting    Toileting assist Assist for toileting: Supervision/Verbal cueing     Transfers Chair/bed transfer  Transfers assist     Chair/bed transfer assist level: Contact Guard/Touching assist(walker, AFo)     Locomotion Ambulation   Ambulation assist      Assist level: Contact Guard/Touching assist Assistive device: Walker-rolling(AFO) Max distance: 100   Walk 10 feet activity   Assist  Walk 10 feet activity did not occur: Safety/medical concerns  Assist level: Contact Guard/Touching assist Assistive device: Walker-rolling   Walk 50 feet activity   Assist Walk 50 feet with 2 turns activity did not occur: Safety/medical concerns  Assist level: Contact Guard/Touching assist Assistive device: Walker-rolling    Walk 150 feet activity   Assist Walk 150 feet activity did not occur: Safety/medical concerns         Walk 10 feet on uneven surface  activity   Assist Walk 10 feet on uneven surfaces activity did not occur: Safety/medical concerns         Wheelchair  Assist   Type of Wheelchair: Manual    Wheelchair assist level: Supervision/Verbal cueing Max wheelchair distance: 150    Wheelchair 50 feet with 2 turns activity    Assist        Assist Level: Supervision/Verbal cueing   Wheelchair 150 feet activity     Assist     Assist Level: Supervision/Verbal cueing      Medical Problem List and Plan: 1.  Gait disturbance secondary to lumbar cervical  stenosis with radiculopathy.S/P decompressive anterior cervical discectomy C4-5 with arthrodesis and lumbar laminectomy with foraminotomies L3-4 with discectomy 06/25/2019.  No brace required  Continue CIR  PRAFO nightly, discussed with nursing again  Weaning steroids, plan for further wean on 6/11   Team conference today to discuss current and goals and coordination of care, home and environmental barriers, and discharge planning with nursing, case manager, and therapies.  2.  Antithrombotics: -DVT/anticoagulation: SCDs.    Vascular study negative for DVT             -antiplatelet therapy: N/A 3. Pain Management: Oxycodone and Robaxin as needed  Appears controlled on 6/9             Monitor with increased exertion 4. Mood: Provide emotional support             -antipsychotic agents: N/A 5. Neuropsych: This patient is capable of making decisions on his own behalf. 6. Skin/Wound Care: Routine skin checks 7. Fluids/Electrolytes/Nutrition: Routine in and outs.   8.  Urinary retention.  Flomax 0.4 mg daily.    Foley DC'd  PVRs not completed, but appears to be voiding without difficulty  UA negative, urine culture no growth 9.  Hyperlipidemia.  Resume Lipitor 10.  Drug-induced constipation  Bowel meds increased on 6/5 with good improvement, decreased on 6/6  Improving             Adjust bowel meds as necessary 11. Steroid-induced hyperglycemia on prediabetes  Elevated on 6/7  Continue to monitor 12.  Elevated blood pressure  Labile on 6/10, monitor off steroids 13. Leukocytosis-likely secondary to steroids  See #8  WBC 13.0 on 6/8  Afebrile  Continue to monitor    LOS: 5 days A FACE TO FACE EVALUATION WAS PERFORMED  Alan Riley Alan Riley 07/03/2019, 8:08 AM

## 2019-07-03 NOTE — Progress Notes (Signed)
Physical Therapy Session Note  Patient Details  Name: Alan Riley MRN: [id number] Date of Birth: 01/24/1945  Today's Date: 07/03/2019 PT Individual Time: 1300-1411 PT Individual Time Calculation (min): 71 min   Short Term Goals: Week 1:  PT Short Term Goal 1 (Week 1): STG=LTG due to ELOS  Skilled Therapeutic Interventions/Progress Updates:   Pt received sitting in WC and agreeable to PT. WC mobility x 180ft with supervision assist from PT for safety, and min cues for doorway management.   Gait training with RW x 2, 100ft with supervision -min assist for safety in turns. Cues for step length and width to improve safety and improve BOS. Orthotist present for second bout of gait training with recommendation for [place] walk-on AFO. Pt then instructed in reciprocal stepping over 1 inch obtacle 2 x 10 BLE and BUE on RW. Cues for posture and improved step height.  PT instructed pt in seated BLE therex with level 2 tband Hip abduction 2 x 10  Hip flexion 2 x 10  Hip extension x 12  LAQ 2 x 10 HS curls 2 x 10 Cues for decreased speed and full ROM on the R throughout to improve strengthening aspect of movements.   Additional gait training in hall to room x 75ft with supervision assist and RW; cues for safety in turn and to maintain wide BOS as listed above. Pt returned to room and performed ambulatory transfer to bed with supervision assist and RW. Sit>supine completed with supervision assist and cues for back precautions. Pt left supine in bed with call bell in reach and all needs met.       Therapy Documentation Precautions:  Precautions Precautions: Fall, Back Required Braces or Orthoses: (no brace) Restrictions Weight Bearing Restrictions: No   Pain: Pain Assessment Pain Scale: 0-10 Pain Score: 0-No pain Faces Pain Scale: No hurt Pain Type: Acute pain Pain Location: Incision    Therapy/Group: Individual Therapy  Austin E Tucker 07/03/2019, 2:20 PM  

## 2019-07-03 NOTE — Progress Notes (Signed)
Occupational Therapy Session Note  Patient Details  Name: Alan Riley MRN: [id number] Date of Birth: 12-03-1945  Today's Date: 07/03/2019 OT Individual Time: 0902-1002 OT Individual Time Calculation (min): 60 min    Short Term Goals: Week 1:  OT Short Term Goal 1 (Week 1): STGs=LTGs secondary to estimated short LOS  Skilled Therapeutic Interventions/Progress Updates:    Pt in supine with no c/o pain. Interpretor utilized throughout for communication. Pt reports he washed up yesterday, needing cues to remember it has been 2 days since last bathing session.  Pt agreeable to washing up at sink.  Pt completed supine to sit with independence.  Bilateral shoes donned with total assist including right AFO.  Pt completed sit to stand and short distance ambulation to w/c with supervision. Educated pt on RW mgt during turning to left and right to prevent twisting at spine due to pt noted with large movements using RW.  Pt completed UB bathing with setup using long handled sponge to wash upper back with care to avoid bandage on low back.  Pt completed LB dressing to doff shoes, socks, pants, and brief with supervision to facilitate successful use of reacher.  Pt bathed LB using long handled sponge with min assist to safely wash buttocks and comply with spinal precautions.  Pt donned pants and brief needing mod VCs for use of reacher and CGA sit<>stand to pull over hips.  Socks and shoes donned by OT including AFO with total assist. Pt sitting up in w/c, Seat belt alarm donned/on, and call bell in reach.  Therapy Documentation Precautions:  Precautions Precautions: Fall, Back Required Braces or Orthoses: (no brace) Restrictions Weight Bearing Restrictions: No  Pain: Pain Assessment Pain Scale: 0-10 Pain Score: 0-No pain Faces Pain Scale: No hurt Pain Type: Acute pain Pain Location: Incision   Therapy/Group: Individual Therapy  [name] 07/03/2019, 12:56 PM

## 2019-07-03 NOTE — Progress Notes (Signed)
Physical Therapy Session Note  Patient Details  Name: Alan Riley MRN: [id number] Date of Birth: 1945/05/08  Today's Date: 07/03/2019 PT Individual Time: 1002-1058 PT Individual Time Calculation (min): 56 min   Short Term Goals: Week 1:  PT Short Term Goal 1 (Week 1): STG=LTG due to ELOS  Skilled Therapeutic Interventions/Progress Updates:    Patient received up in [facility] pleasant but fatigued following OT session. Used stratus interpreter to facilitate communication during session.Transported to gym totalA in [facility], then worked on standing exercises including sit to stand with no UEs and pressing orange TB outwards at knees, side stepping into orange TB besides mat table, and forward and lateral step ups on 4 inch step with BUE support. Needed Min-ModA for balance and to maintain back precautions with step ups due to weakness and tendency to rotate hips/back. Tolerated gait training approximately 128f but very fatigued at end of gait distance, otherwise able to self-propel WC approximately 1512fwith BUEs today. Needed intermittent cues for maintaining back precautions due to tending to rotate trunk during gait and functional mobility. [facility], safety seat belt active.   Therapy Documentation Precautions:  Precautions Precautions: Fall, Back Required Braces or Orthoses: (no brace) Restrictions Weight Bearing Restrictions: No   Pain: Pain Assessment Pain Scale: 0-10 Pain Score: 0-No pain Faces Pain Scale: No hurt Pain Type: Acute pain Pain Location: Incision    Therapy/Group: Individual Therapy   KrWindell NorfolkDPT, PN1   Supplemental Physical Therapist [place]  Pager [phone] Re[facility] Office [phone]  07/03/2019, 12:32 PM

## 2019-07-04 ENCOUNTER — Inpatient Hospital Stay (HOSPITAL_COMMUNITY): Payer: Medicare HMO | Admitting: Physical Therapy

## 2019-07-04 ENCOUNTER — Inpatient Hospital Stay (HOSPITAL_COMMUNITY): Payer: Medicare HMO | Admitting: Occupational Therapy

## 2019-07-04 DIAGNOSIS — E8809 Other disorders of plasma-protein metabolism, not elsewhere classified: Secondary | ICD-10-CM

## 2019-07-04 DIAGNOSIS — E46 Unspecified protein-calorie malnutrition: Secondary | ICD-10-CM

## 2019-07-04 DIAGNOSIS — Z9889 Other specified postprocedural states: Secondary | ICD-10-CM

## 2019-07-04 MED ORDER — PRO-STAT SUGAR FREE PO LIQD
30.0000 mL | Freq: Two times a day (BID) | ORAL | Status: DC
  Administered 2019-07-04 – 2019-07-09 (×10): 30 mL via ORAL
  Filled 2019-07-04 (×10): qty 30

## 2019-07-04 NOTE — Progress Notes (Signed)
Physical Therapy Session Note  Patient Details  Name: Alan Riley MRN: [id number] Date of Birth: 1945/09/09  Today's Date: 07/04/2019 PT Individual Time: 2919-1660 PT Individual Time Calculation (min): 42 min   Short Term Goals: Week 1:  PT Short Term Goal 1 (Week 1): STG=LTG due to ELOS  Skilled Therapeutic Interventions/Progress Updates:    Patient received up in [facility], pleasant and willing to participate with PT. Able to self-propel WC approximately 178f with BUEs and cues for navigation, then focused session on stair training for majority of session- practiced navigation of single step as well as 3 steps on stairwell in PT gym with MPlevnaon first attempt fading to min guardx2 for safety. Also discussed possibility and benefits of family installing right ascending rail at entry to his home to improve safety of stair navigation in setting of back precautions and balance, he states that he will likely be able to get son to install a rail [facility]. Continues to need cues to avoid twisting back during functional tasks.  [facility], seatbelt alarm active.   Therapy Documentation Precautions:  Precautions Precautions: Fall, Back Required Braces or Orthoses:  (no brace) Restrictions Weight Bearing Restrictions: No Pain: Pain Assessment Pain Scale: 0-10 Pain Score: 0-No pain    Therapy/Group: Individual Therapy   KWindell Norfolk DPT, PN1   Supplemental Physical Therapist [place]   Pager [phone] Rehab Office [phone]   07/04/2019, 12:16 PM

## 2019-07-04 NOTE — Progress Notes (Signed)
[place] PHYSICAL MEDICINE & REHABILITATION PROGRESS NOTE  Subjective/Complaints: Patient seen sitting up in bed this morning.  He indicates he slept well overnight.  He appears to indicate improvement in bowel movements.  His PRAFO remains wrapped, discussed with Retail buyer.  ROS: Limited due to language  Objective: Vital Signs: Blood pressure (!) 144/82, pulse (!) 53, temperature 97.7 F (36.5 C), resp. rate 17, SpO2 100 %. No results found. Recent Labs    07/02/19 0636  WBC 13.0*  HGB 15.1  HCT 44.8  PLT 209   No results for input(s): NA, K, CL, CO2, GLUCOSE, BUN, CREATININE, CALCIUM in the last 72 hours.  Physical Exam: BP (!) 144/82   Pulse (!) 53   Temp 97.7 F (36.5 C)   Resp 17   SpO2 100%  Constitutional: No distress . Vital signs reviewed. HENT: Normocephalic.  Atraumatic. Eyes: EOMI. No discharge. Cardiovascular: No JVD. Respiratory: Normal effort.  No stridor. GI: Non-distended. Skin: Surgical site C/D/I Psych: Normal mood.  Normal behavior. Musc: No edema in extremities.  No tenderness in extremities. Neurological: Alert Motor: Left lower extremity: 4+/5 proximal to distal Right lower extremity: Hip flexion, knee extension 4/5, ankle dorsiflexion 0/5, unchanged  Assessment/Plan: 1. Functional deficits secondary to radiculopathy which require 3+ hours per day of interdisciplinary therapy in a comprehensive inpatient rehab setting.  Physiatrist is providing close team supervision and 24 hour management of active medical problems listed below.  Physiatrist and rehab team continue to assess barriers to discharge/monitor patient progress toward functional and medical goals  Care Tool:  Bathing    Body parts bathed by patient: Right arm, Left arm, Chest, Abdomen, Front perineal area, Right upper leg, Left upper leg, Right lower leg, Left lower leg, Face   Body parts bathed by helper: Buttocks     Bathing assist Assist Level: Minimal Assistance  - Patient > 75%     Upper Body Dressing/Undressing Upper body dressing   What is the patient wearing?: Pull over shirt    Upper body assist Assist Level: Set up assist    Lower Body Dressing/Undressing Lower body dressing      What is the patient wearing?: Pants, Incontinence brief     Lower body assist Assist for lower body dressing: Contact Guard/Touching assist (using reacher)     Toileting Toileting    Toileting assist Assist for toileting: Supervision/Verbal cueing     Transfers Chair/bed transfer  Transfers assist     Chair/bed transfer assist level: Supervision/Verbal cueing     Locomotion Ambulation   Ambulation assist      Assist level: Contact Guard/Touching assist Assistive device: Walker-rolling Max distance: 113ft   Walk 10 feet activity   Assist  Walk 10 feet activity did not occur: Safety/medical concerns  Assist level: Contact Guard/Touching assist Assistive device: Walker-rolling   Walk 50 feet activity   Assist Walk 50 feet with 2 turns activity did not occur: Safety/medical concerns  Assist level: Contact Guard/Touching assist Assistive device: Walker-rolling    Walk 150 feet activity   Assist Walk 150 feet activity did not occur: Safety/medical concerns         Walk 10 feet on uneven surface  activity   Assist Walk 10 feet on uneven surfaces activity did not occur: Safety/medical concerns         Wheelchair     Assist   Type of Wheelchair: Manual    Wheelchair assist level: Supervision/Verbal cueing Max wheelchair distance: 138ft    Wheelchair 50  feet with 2 turns activity    Assist        Assist Level: Supervision/Verbal cueing   Wheelchair 150 feet activity     Assist     Assist Level: Supervision/Verbal cueing      Medical Problem List and Plan: 1.  Gait disturbance secondary to lumbar cervical stenosis with radiculopathy.S/P decompressive anterior cervical discectomy C4-5  with arthrodesis and lumbar laminectomy with foraminotomies L3-4 with discectomy 06/25/2019.  No brace required  Continue CIR  PRAFO nightly, discussed with nursing director  Weaning steroids, plan for further wean on 6/11 2.  Antithrombotics: -DVT/anticoagulation: SCDs.    Vascular study negative for DVT             -antiplatelet therapy: N/A 3. Pain Management: Oxycodone and Robaxin as needed  Appears controlled on 6/10             Monitor with increased exertion 4. Mood: Provide emotional support             -antipsychotic agents: N/A 5. Neuropsych: This patient is capable of making decisions on his own behalf. 6. Skin/Wound Care: Routine skin checks 7. Fluids/Electrolytes/Nutrition: Routine in and outs.   8.  Urinary retention.  Flomax 0.4 mg daily.    Foley DC'd  PVRs not completed, but appears to be voiding without difficulty  UA negative, urine culture no growth 9.  Hyperlipidemia.  Resume Lipitor 10.  Drug-induced constipation  Bowel meds increased on 6/5 with good improvement, decreased on 6/6  Improving             Adjust bowel meds as necessary 11. Steroid-induced hyperglycemia on prediabetes  Elevated on 6/7  Continue to monitor 12.  Elevated blood pressure  Controlled on 6/10 13. Leukocytosis-likely secondary to steroids  See #8  WBC 13.0 on 6/8  Afebrile  Continue to monitor 14.  Hypoalbuminemia  Supplement initiated on 6/10  LOS: 6 days A FACE TO FACE EVALUATION WAS PERFORMED  Catheline Hixon Lorie Phenix 07/04/2019, 8:19 AM

## 2019-07-04 NOTE — Progress Notes (Signed)
Patient ID: Alan Riley, male   DOB: 09-Jul-1945, 74 y.o.   MRN: [id number]   [facility] order placed

## 2019-07-04 NOTE — Progress Notes (Signed)
Occupational Therapy Session Note  Patient Details  Name: Alan Riley MRN: [id number] Date of Birth: Jun 30, 1945  Today's Date: 07/04/2019 OT Individual Time: 0900-1000 and 1300-1415 OT Individual Time Calculation (min): 60 min and 75 min    Short Term Goals: Week 1:  OT Short Term Goal 1 (Week 1): STGs=LTGs secondary to estimated short LOS  Skilled Therapeutic Interventions/Progress Updates:  First session: Pt supine in bed with interpreter present for entire session to assist in communication. Pt reports no pain at this time. Pt requesting to use the bathroom and bathe in shower.  Pt completed supine to sit with min assist to ensure follow through of spinal precuations.  Pt completed SPT EOB to w/c with CGA.  SPT w/c to toilet using grab bars with min assist.  Pt needing min assist to ensure thorough cleaning after bowel movement in standing due to pt unable to complete without twisting.  Pt completed shower chair transfer using RW and grab bars needing CGA and mod TCs to prevent twisting.  Pt bathed UB and LB with min assist for buttocks.  Mod assist to dry BLE and surface of floor. Notified nurse of pts need for bandage change over low back.   Pt returned to w/c with CGA and use of RW.  Pt donned shirt with setup and brief and pants using reacher with CGA to stand and pull over hips.  Educated pt on use of long handled shoe horn and regular socks to increase ease donning shoes.  Max assist to donn socks and shoes despite use of AE.  Discussed with pt having family bring in shoes with velcro or shoelace opening to trial next session, and pt agreeable.  Alarm on, and call bell in reach.    Second Session: Pt up in w/c just finished with lunch and interpreter present.  No complaints of pain.  Pt self propelled through room and in hallway approx 30 feet to gym.  Pt participated in static and minimal dynamic standing challenge to match cards on board at shoulder level.  Pt facilitated in standing  without UE support to further challenge.  Pt needing one sitting break and reports feeling tired but no back pain.  Pt needed CGA through standing balance task.   Pt requested to shave beard with his razor at sink.  OT cleared by nursing to shave as long as razor stays away from incision on right aspect of neck.  Pt shaved beard with min assist with care around incision.  Pt in w/c with seat belt alarm donned, call bell in reach.     Therapy Documentation Precautions:  Precautions Precautions: Fall, Back Required Braces or Orthoses:  (no brace) Restrictions Weight Bearing Restrictions: No      Therapy/Group: Individual Therapy  [name] 07/04/2019, 4:30 PM

## 2019-07-05 ENCOUNTER — Inpatient Hospital Stay (HOSPITAL_COMMUNITY): Payer: Medicare HMO | Admitting: Physical Therapy

## 2019-07-05 ENCOUNTER — Inpatient Hospital Stay (HOSPITAL_COMMUNITY): Payer: Medicare HMO | Admitting: Occupational Therapy

## 2019-07-05 MED ORDER — DEXAMETHASONE 4 MG PO TABS
2.0000 mg | ORAL_TABLET | Freq: Two times a day (BID) | ORAL | Status: DC
  Administered 2019-07-05 – 2019-07-07 (×5): 2 mg via ORAL
  Filled 2019-07-05 (×5): qty 1

## 2019-07-05 MED ORDER — DEXAMETHASONE SODIUM PHOSPHATE 4 MG/ML IJ SOLN
2.0000 mg | Freq: Two times a day (BID) | INTRAMUSCULAR | Status: DC
  Filled 2019-07-05: qty 0.5

## 2019-07-05 NOTE — Progress Notes (Signed)
Patient ID: Alan Riley, male   DOB: 1945-07-18, 74 y.o.   MRN: [id number]   Rolling walker and transport ordered. Patient daughter reports patient does not have at home.

## 2019-07-05 NOTE — Progress Notes (Signed)
Physical Therapy Weekly Progress Note  Patient Details  Name: Alan Riley MRN: [id number] Date of Birth: 04-Apr-1945  Beginning of progress report period: June 29, 2019 End of progress report period: July 06, 2019  Today's Date: 07/05/2019 PT Individual Time: 0901-1000 PT Individual Time Calculation (min): 59 min   Patient is progressing well and is on track for meeting all LTGs at this time; evaluating therapist set all STGs = LTGs without specific short term goals listed, however at this time he is making good progress and is doing well with therapies thus far.   Patient continues to demonstrate the following deficits muscle weakness, unbalanced muscle activation and decreased coordination, decreased safety awareness and decreased standing balance, decreased balance strategies and difficulty maintaining precautions and therefore will continue to benefit from skilled PT intervention to increase functional independence with mobility.  Patient progressing toward long term goals..  Continue plan of care.  PT Short Term Goals Week 1:  PT Short Term Goal 1 (Week 1): STG=LTG due to ELOS PT Short Term Goal 1 - Progress (Week 1): Progressing toward goal Week 2:  PT Short Term Goal 1 (Week 2): STG = LTGs due to remaining ELOS  Skilled Therapeutic Interventions/Progress Updates:    patient received in bed, pleasant and willing to participate in session. Continued education regarding back/cervical precautions as well as plan of action for navigating steps today. Able to perform bed mobility and functional transfers with S today with VC to maintain precautions, then focused majority of session on continuing stair training today- continued practice of steps with RW, also trialed lateral step navigation however this PT does not feel that lateral stepping approach is safe at this time due to coordination, R LE weakness, and acute spine precautions. Continued gait training including gait approximately 148f  with RW over level surface, as well as gait through obstacle course involving stepping over hockey sticks, on to/off of platform, and over soft mat with bean bags underneath it to mimic unsteady surface. Needed Min-ModA for balance and RW management with obstacle course. [facility] with all needs otherwise met this morning.   Therapy Documentation Precautions:  Precautions Precautions: Fall, Back Required Braces or Orthoses:  (no brace) Restrictions Weight Bearing Restrictions: No  Pain: Pain Assessment Pain Scale: 0-10 Pain Score: 0-No pain    Therapy/Group: Individual Therapy   KWindell Norfolk DPT, PN1   Supplemental Physical Therapist [place]   Pager [phone] Rehab Office [phone]   07/05/2019, 12:26 PM

## 2019-07-05 NOTE — Evaluation (Signed)
Recreational Therapy Assessment and Plan  Patient Details  Name: Alan Riley MRN: [id number] Date of Birth: 1945/03/30 Today's Date: 07/05/2019  Rehab Potential:  Good ELOS:  6/15 Assessment Problem List:      Patient Active Problem List   Diagnosis Date Noted  . Radiculopathy 06/28/2019  . Drug induced constipation   . Dyslipidemia   . Urinary retention   . Postoperative pain   . S/P cervical discectomy   . Cervical herniated disc 06/25/2019  . S/P cervical spinal fusion 06/25/2019  . Memory difficulties 02/23/2016  . Right shoulder pain 10/05/2015  . Prediabetes 04/17/2015  . Subclinical hypothyroidism 12/22/2014  . Healthcare maintenance 12/22/2014  . Pure hypercholesterolemia 11/29/2013  . Basal cell carcinoma of brow 11/29/2013  . History of colon cancer, stage III 04/14/2011    Past Medical History:      Past Medical History:  Diagnosis Date  . Basal cell carcinoma of skin 2019   scalp  . HLD (hyperlipidemia)   . Hx of adenomatous colonic polyps   . Iron deficiency anemia secondary to blood loss (chronic)   . Malignant neoplasm of ascending colon ([facility]) 2004  . Partial small bowel obstruction ([facility]) 12/29/2013  . Uses wheelchair    Past Surgical History:       Past Surgical History:  Procedure Laterality Date  . ANTERIOR CERVICAL DECOMP/DISCECTOMY FUSION N/A 06/25/2019   Procedure: C4-5 ACDF;  Surgeon: [name], MD;  Location: [facility];  Service: Neurosurgery;  Laterality: N/A;  . BACK SURGERY     over 30 yrs ago   . CHOLECYSTECTOMY  1991  . COLONOSCOPY  2013, 2016   x 2  . HEMICOLECTOMY  01/15/03  . LUMBAR LAMINECTOMY/DECOMPRESSION MICRODISCECTOMY Bilateral 06/25/2019   Procedure: L3-4 LUMBAR LAMINECTOMY/DECOMPRESSION MICRODISCECTOMY;  Surgeon: [name], MD;  Location: [facility];  Service: Neurosurgery;  Laterality: Bilateral;    Assessment & Plan Clinical Impression: Patient is a 74 year old right-handed  non-English-speaking male from [place]. History taken from chart review due to language. Past medical history of hyperlipidemia as well as back surgery 30 years ago. Patient lives with spouse. Two-level home 2 steps to entry. Patient was independent up until approximately 3 weeks ago with noted decreased functional mobility and unable to complete ADLs. He presented on 06/25/2019 with gait abnormality, neck pain, dysesthesias in upper extremities and weakness in lower extremities times several weeks. Work-up and imaging revealed severe cervical spinal stenosis C4-5 as well as severe lumbar stenosis L3-4 disc herniation. He underwent decompressive anterior cervical discectomy C4-5 anterior cervical arthrodesis C4-5 with anterior cervical plating as well as decompressive lumbar laminectomy medial for foraminotomies L3-4 bilaterally with discectomy on 06/25/2019 per Dr. Sherley Bounds. No brace needed. Decadron protocol as indicated. Hospital course complicated by bouts of urinary retention and indwelling Foley was placed.  Patient transferred to CIR on 06/28/2019 .   Pt presents with decreased activity tolerance, decreased functional mobility, decreased balance Limiting pt's independence with leisure/community pursuits.  Pt referred for TR services with emphasis on community reintegration.  Met with pt today during co-treat with PT to discuss leisure, safety awareness & community ambulation.  Pt navigated through and over obstacle course simulating outdoor surfaces using RW with Min assist, verbal cues for safety/placement of RW.  Pt stated understanding.  Plan  No further TR as pt is expected to d/c 6/15  Recommendations for other services: None   Discharge Criteria: Patient will be discharged from TR if patient refuses treatment 3 consecutive times without medical  reason.  If treatment goals not met, if there is a change in medical status, if patient makes no progress towards goals or if patient is  discharged from hospital.  The above assessment, treatment plan, treatment alternatives and goals were discussed and mutually agreed upon: by patient  [place] 07/05/2019, 12:39 PM

## 2019-07-05 NOTE — Progress Notes (Signed)
Occupational Therapy Session Note  Patient Details  Name: Alan Riley MRN: [id number] Date of Birth: 1945-12-26  Today's Date: 07/05/2019 OT Individual Time: 1000-1058 OT Individual Time Calculation (min): 58 min   Short Term Goals: Week 1:  OT Short Term Goal 1 (Week 1): STGs=LTGs secondary to estimated short LOS  Skilled Therapeutic Interventions/Progress Updates:    Pt greeted via PT handoff in hallway. Stratus interpretor in use for interpretation. Pt had no c/o pain, declining participation in ADLs but agreeable to engage in gentle UB strengthening to start session, fatigued from ambulation during PT session. While in the room, first guided pt through UE stretches while adhering to back precautions. Next instructed him through B UE therapeutic exercises using 2# bar x10 reps, 2 sets. Pt required cuing for technique and coordinating breathing with movement. Sit<stands completed with min cuing for hand placement on RW, CGA fading to close supervision, 10 reps 2 sets. We also discussed his shower setup at home with pt verbalizing that he has a seat with handles inside of his tub shower, has grab bars that are "a part of" the shower and that family is going to install more grab bars prior to d/c home. He would benefit from practicing this simulated shower transfer during next OT session. At end of tx pt was left in w/c with all needs within reach and safety belt fastened. Tx focus placed on general strengthening and endurance while adhering to back precautions.   Therapy Documentation Precautions:  Precautions Precautions: Fall, Back Required Braces or Orthoses:  (no brace) Restrictions Weight Bearing Restrictions: No Pain: Pain Assessment Pain Scale: 0-10 Pain Score: 0-No pain ADL:     Therapy/Group: Individual Therapy  Markasia Carrol A Brie Eppard 07/05/2019, 12:41 PM

## 2019-07-05 NOTE — Progress Notes (Signed)
Occupational Therapy Weekly Progress Note  Patient Details  Name: Alan Riley MRN: [id number] Date of Birth: [birth year]  Beginning of progress report period: June 29, 2019 End of progress report period: July 05, 2019  Today's Date: 07/05/2019 OT Individual Time: 1300-1409 OT Individual Time Calculation (min): 69 min    Patient has met 5 of 10 short term goals. Pt is making excellent progress and receptive to all education and training provided by OT.  Pt exhibits improved standing balance, improved safety awareness including body mechanics to comply with spinal precautions, and increased proficiency with use of AE during dressing and bathing tasks.  Pt would benefit from alternate footwear and caregiver training in donning/doffing shoes and AFO on right foot to increase independence and ease.  Pt plans to bath at sinkside on first floor setup upon return to home and does not need shower chair at this time.  Pt demonstrated increased safety and independence during toilet transfers using [facility] and will benefit from continued use at home while spinal precautions in place.  Patient continues to demonstrate the following deficits: muscle weakness, abnormal tone, decreased attention and decreased safety awareness and decreased standing balance and therefore will continue to benefit from skilled OT intervention to enhance overall performance with BADL.  Patient progressing toward long term goals..  Continue plan of care.  OT Short Term Goals Week 1:  OT Short Term Goal 1 (Week 1): STGs=LTGs secondary to estimated short LOS  Skilled Therapeutic Interventions/Progress Updates:  Pt in w/c having just finished lunch with no complaints of pain upon OT arrival.  Pt reports his wife will be bringing alternate footwear today at 3pm.  Educated pt on need for 3 in 1 commode over toilet to comply better with spinal precautions.  Pt initially reporting he prefers to be on lower surface while toileting, but after  OT provided education, pt in agreement to use 3 in 1 commode. Pt demonstrated ability to ambulate to bathroom and complete 3 in 1 commode transfer with supervision and improved safety awareness while manageing RW during turn to comply with spinal precautions.  Thereafter, pt requesting to go outside for remainder of therapy. Assigned nurse notified of pts request and clearance given to leave floor. Pt self propelled from elevator to exit of hospital approx 50 feet with min assist to propel over thresholds.  Pt ambulated with RW approx 30 feet x 2 needing CGA with one seated RB of min duration.  Pt requesting to return to bed.  Pt self propelled back to room from ground floor including over thresholds with supervision.  SPT w/c to EOB and sit to supine with VCs for proper body mechanics.  Bed alarm on, call bell in reach.       Therapy Documentation Precautions:  Precautions Precautions: Fall, Back Required Braces or Orthoses:  (no brace) Restrictions Weight Bearing Restrictions: No    Therapy/Group: Individual Therapy  [name] 07/05/2019, 6:35 PM

## 2019-07-05 NOTE — Progress Notes (Signed)
[facility] PHYSICAL MEDICINE & REHABILITATION PROGRESS NOTE  Subjective/Complaints: Patient seen sitting up in bed this morning.  He indicates he slept well overnight. He has PRAFO in place.   ROS: Limited due to language  Objective: Vital Signs: Blood pressure 140/77, pulse (!) 59, temperature 97.7 F (36.5 C), resp. rate 17, SpO2 97 %. No results found. No results for input(s): WBC, HGB, HCT, PLT in the last 72 hours. No results for input(s): NA, K, CL, CO2, GLUCOSE, BUN, CREATININE, CALCIUM in the last 72 hours.  Physical Exam: BP 140/77 (BP Location: Left Arm)   Pulse (!) 59   Temp 97.7 F (36.5 C)   Resp 17   SpO2 97%  Constitutional: No distress . Vital signs reviewed. HENT: Normocephalic.  Atraumatic. Eyes: EOMI. No discharge. Cardiovascular: No JVD. Respiratory: Normal effort.  No stridor. GI: Non-distended. Skin: Surgical site C/D/I Psych: Normal mood.  Normal behavior. Musc: No edema in extremities.  No tenderness in extremities. Neurological: Alert Motor: Left lower extremity: 4+/5 proximal to distal Right lower extremity: Hip flexion, knee extension 4/5, ankle dorsiflexion 1/5  Assessment/Plan: 1. Functional deficits secondary to radiculopathy which require 3+ hours per day of interdisciplinary therapy in a comprehensive inpatient rehab setting.  Physiatrist is providing close team supervision and 24 hour management of active medical problems listed below.  Physiatrist and rehab team continue to assess barriers to discharge/monitor patient progress toward functional and medical goals  Care Tool:  Bathing    Body parts bathed by patient: Right arm, Left arm, Chest, Abdomen, Front perineal area, Right upper leg, Left upper leg, Right lower leg, Left lower leg, Face (using long handled sponge)   Body parts bathed by helper: Buttocks     Bathing assist Assist Level: Minimal Assistance - Patient > 75%     Upper Body Dressing/Undressing Upper body dressing    What is the patient wearing?: Pull over shirt    Upper body assist Assist Level: Set up assist    Lower Body Dressing/Undressing Lower body dressing      What is the patient wearing?: Incontinence brief, Pants     Lower body assist Assist for lower body dressing: Contact Guard/Touching assist     Toileting Toileting    Toileting assist Assist for toileting: Minimal Assistance - Patient > 75% (for bowel pericare)     Transfers Chair/bed transfer  Transfers assist     Chair/bed transfer assist level: Supervision/Verbal cueing     Locomotion Ambulation   Ambulation assist      Assist level: Contact Guard/Touching assist Assistive device: Walker-rolling Max distance: 153ft   Walk 10 feet activity   Assist  Walk 10 feet activity did not occur: Safety/medical concerns  Assist level: Contact Guard/Touching assist Assistive device: Walker-rolling   Walk 50 feet activity   Assist Walk 50 feet with 2 turns activity did not occur: Safety/medical concerns  Assist level: Contact Guard/Touching assist Assistive device: Walker-rolling    Walk 150 feet activity   Assist Walk 150 feet activity did not occur: Safety/medical concerns         Walk 10 feet on uneven surface  activity   Assist Walk 10 feet on uneven surfaces activity did not occur: Safety/medical concerns         Wheelchair     Assist   Type of Wheelchair: Manual    Wheelchair assist level: Supervision/Verbal cueing Max wheelchair distance: 123ft    Wheelchair 50 feet with 2 turns activity    Assist  Assist Level: Supervision/Verbal cueing   Wheelchair 150 feet activity     Assist     Assist Level: Supervision/Verbal cueing      Medical Problem List and Plan: 1.  Gait disturbance secondary to lumbar cervical stenosis with radiculopathy.S/P decompressive anterior cervical discectomy C4-5 with arthrodesis and lumbar laminectomy with foraminotomies  L3-4 with discectomy 06/25/2019.  No brace required  Continue CIR  PRAFO nightly, discussed with nursing director  Weaning steroids, decreased further on 6/11 2.  Antithrombotics: -DVT/anticoagulation: SCDs.    Vascular study negative for DVT             -antiplatelet therapy: N/A 3. Pain Management: Oxycodone and Robaxin as needed  Controlled on 6/11             Monitor with increased exertion 4. Mood: Provide emotional support             -antipsychotic agents: N/A 5. Neuropsych: This patient is capable of making decisions on his own behalf. 6. Skin/Wound Care: Routine skin checks 7. Fluids/Electrolytes/Nutrition: Routine in and outs.   8.  Urinary retention.  Flomax 0.4 mg daily.    Foley DC'd  PVRs not completed, but appears to be voiding without difficulty  UA negative, urine culture no growth 9.  Hyperlipidemia.  Resume Lipitor 10.  Drug-induced constipation  Bowel meds increased on 6/5 with good improvement, decreased on 6/6  Improving             Adjust bowel meds as necessary 11. Steroid-induced hyperglycemia on prediabetes  Elevated on 6/7, labs ordered for Monday  Continue to monitor 12.  Elevated blood pressure  Controlled on 6/11 13. Leukocytosis-likely secondary to steroids  See #8  WBC 13.0 on 6/8  Afebrile  Continue to monitor 14.  Hypoalbuminemia  Supplement initiated on 6/10  LOS: 7 days A FACE TO FACE EVALUATION WAS PERFORMED  Laniya Friedl Lorie Phenix 07/05/2019, 8:15 AM

## 2019-07-06 ENCOUNTER — Encounter (HOSPITAL_COMMUNITY): Payer: Medicare HMO

## 2019-07-06 ENCOUNTER — Ambulatory Visit (HOSPITAL_COMMUNITY): Payer: Medicare HMO | Admitting: Physical Therapy

## 2019-07-06 DIAGNOSIS — D72823 Leukemoid reaction: Secondary | ICD-10-CM

## 2019-07-06 NOTE — Progress Notes (Signed)
[facility] PHYSICAL MEDICINE & REHABILITATION PROGRESS NOTE  Subjective/Complaints: Pt up in bed. In good spirits. excited about dc coming up.   ROS: Patient denies fever, rash, sore throat, blurred vision, nausea, vomiting, diarrhea, cough, shortness of breath or chest pain, joint or back pain, headache, or mood change.   Objective: Vital Signs: Blood pressure (!) 141/75, pulse (!) 50, temperature 98.3 F (36.8 C), resp. rate 17, SpO2 99 %. No results found. No results for input(s): WBC, HGB, HCT, PLT in the last 72 hours. No results for input(s): NA, K, CL, CO2, GLUCOSE, BUN, CREATININE, CALCIUM in the last 72 hours.  Physical Exam: BP (!) 141/75 (BP Location: Left Arm)   Pulse (!) 50   Temp 98.3 F (36.8 C)   Resp 17   SpO2 99%  Constitutional: No distress . Vital signs reviewed. HEENT: EOMI, oral membranes moist Neck: supple Cardiovascular: RRR without murmur. No JVD    Respiratory/Chest: CTA Bilaterally without wheezes or rales. Normal effort    GI/Abdomen: BS +, non-tender, non-distended Ext: no clubbing, cyanosis, or edema Psych: pleasant and cooperative Skin: surg site CDI Psych: Normal mood.  Normal behavior. Musc: No edema in extremities.  No tenderness in extremities. Neurological: Alert Motor: Left lower extremity: 4+/5 proximal to distal Right lower extremity: Hip flexion, knee extension 4/5, ankle dorsiflexion 1/5 -has PRAFO on Assessment/Plan: 1. Functional deficits secondary to radiculopathy which require 3+ hours per day of interdisciplinary therapy in a comprehensive inpatient rehab setting.  Physiatrist is providing close team supervision and 24 hour management of active medical problems listed below.  Physiatrist and rehab team continue to assess barriers to discharge/monitor patient progress toward functional and medical goals  Care Tool:  Bathing    Body parts bathed by patient: Right arm, Left arm, Chest, Abdomen, Front perineal area, Right  upper leg, Left upper leg, Right lower leg, Left lower leg, Face (using long handled sponge)   Body parts bathed by helper: Buttocks     Bathing assist Assist Level: Minimal Assistance - Patient > 75%     Upper Body Dressing/Undressing Upper body dressing   What is the patient wearing?: Pull over shirt    Upper body assist Assist Level: Set up assist    Lower Body Dressing/Undressing Lower body dressing      What is the patient wearing?: Incontinence brief, Pants     Lower body assist Assist for lower body dressing: Contact Guard/Touching assist     Toileting Toileting    Toileting assist Assist for toileting: Minimal Assistance - Patient > 75% (for bowel pericare)     Transfers Chair/bed transfer  Transfers assist     Chair/bed transfer assist level: Supervision/Verbal cueing     Locomotion Ambulation   Ambulation assist      Assist level: Supervision/Verbal cueing Assistive device: Walker-rolling Max distance: 161ft   Walk 10 feet activity   Assist  Walk 10 feet activity did not occur: Safety/medical concerns  Assist level: Supervision/Verbal cueing Assistive device: Walker-rolling   Walk 50 feet activity   Assist Walk 50 feet with 2 turns activity did not occur: Safety/medical concerns  Assist level: Supervision/Verbal cueing Assistive device: Walker-rolling    Walk 150 feet activity   Assist Walk 150 feet activity did not occur: Safety/medical concerns         Walk 10 feet on uneven surface  activity   Assist Walk 10 feet on uneven surfaces activity did not occur: Safety/medical concerns  Wheelchair     Assist   Type of Wheelchair: Agricultural engineer assist level: Supervision/Verbal cueing Max wheelchair distance: 162ft    Wheelchair 50 feet with 2 turns activity    Assist        Assist Level: Supervision/Verbal cueing   Wheelchair 150 feet activity     Assist     Assist Level:  Supervision/Verbal cueing      Medical Problem List and Plan: 1.  Gait disturbance secondary to lumbar cervical stenosis with radiculopathy.S/P decompressive anterior cervical discectomy C4-5 with arthrodesis and lumbar laminectomy with foraminotomies L3-4 with discectomy 06/25/2019.  No brace required  Continue CIR, family ed this weekend  PRAFO nightly, discussed with nursing director  Weaning steroids to off, decreased further on 6/11 2.  Antithrombotics: -DVT/anticoagulation: SCDs.    Vascular study negative for DVT             -antiplatelet therapy: N/A 3. Pain Management: Oxycodone and Robaxin as needed  Controlled on 6/12             Monitor with increased exertion 4. Mood: Provide emotional support             -antipsychotic agents: N/A 5. Neuropsych: This patient is capable of making decisions on his own behalf. 6. Skin/Wound Care: Routine skin checks 7. Fluids/Electrolytes/Nutrition: Routine in and outs.   8.  Urinary retention.  Flomax 0.4 mg daily.    Foley DC'd  PVRs not completed, but appears to be voiding without difficulty  UA negative, urine culture no growth 9.  Hyperlipidemia.  Resume Lipitor 10.  Drug-induced constipation  Bowel meds increased on 6/5 with good improvement, decreased on 6/6  Improving, 2 bm's overnight/this morning 6/12               11. Steroid-induced hyperglycemia on prediabetes  Elevated on 6/7, labs ordered for Monday  Continue to monitor 12.  Elevated blood pressure  Controlled on 6/12 13. Leukocytosis-likely secondary to steroids  See #8  WBC 13.0 on 6/8  Afebrile  Continue to monitor 14.  Hypoalbuminemia  Supplement initiated on 6/10  LOS: 8 days A FACE TO FACE EVALUATION WAS PERFORMED  Meredith Staggers 07/06/2019, 8:47 AM

## 2019-07-06 NOTE — Plan of Care (Signed)
  Problem: Consults Goal: RH SPINAL CORD INJURY PATIENT EDUCATION Description:  See Patient Education module for education specifics.  Outcome: Progressing   Problem: SCI BOWEL ELIMINATION Goal: RH STG MANAGE BOWEL WITH ASSISTANCE Description: STG Manage Bowel with min Assistance. Outcome: Progressing Goal: RH STG SCI MANAGE BOWEL PROGRAM W/ASSIST OR AS APPROPRIATE Description: STG SCI Manage bowel program w/assist or as appropriate. Outcome: Progressing   Problem: SCI BLADDER ELIMINATION Goal: RH STG MANAGE BLADDER WITH ASSISTANCE Description: STG Manage Bladder With min Assistance Outcome: Progressing   Problem: RH SKIN INTEGRITY Goal: RH STG SKIN FREE OF INFECTION/BREAKDOWN Outcome: Progressing Goal: RH STG MAINTAIN SKIN INTEGRITY WITH ASSISTANCE Description: STG Maintain Skin Integrity With min Assistance. Outcome: Progressing   Problem: RH SAFETY Goal: RH STG ADHERE TO SAFETY PRECAUTIONS W/ASSISTANCE/DEVICE Description: STG Adhere to Safety Precautions With mod I Assistance/Device. Outcome: Progressing Goal: RH STG DECREASED RISK OF FALL WITH ASSISTANCE Description: STG Decreased Risk of Fall With mod I Assistance. Outcome: Progressing   Problem: RH PAIN MANAGEMENT Goal: RH STG PAIN MANAGED AT OR BELOW PT'S PAIN GOAL Description: Less than 3 out of 10 Outcome: Progressing   Problem: RH KNOWLEDGE DEFICIT SCI Goal: RH STG INCREASE KNOWLEDGE OF SELF CARE AFTER SCI Outcome: Progressing

## 2019-07-06 NOTE — Progress Notes (Signed)
Physical Therapy Session Note  Patient Details  Name: Alan Riley MRN: [id number] Date of Birth: [birth year]  Today's Date: 07/06/2019 PT Individual Time: 0907-1016 PT Individual Time Calculation (min): 69 min   Short Term Goals: Week 2:  PT Short Term Goal 1 (Week 2): STG = LTGs due to remaining ELOS  Skilled Therapeutic Interventions/Progress Updates:    Pt received sitting in w/c with his wife and daughter present for hands-on family education/training. Pt agreeable to therapy session. Therapist educated pt's family on use of PRAFO at night and AFO during day for standing/ambulating - discussed options of using urinal vs BSC at night to avoid having to walk to/from bathroom. Educated family on recommendation from primary therapist on follow-up HHPT and 24hr support. Pt wearing RLE AFO during session. Transported to/from gym in w/c for time management and energy conservation. Session focused on determining safest technique for performing 3stair navigation for home entry. Ascended/descended 4 (6" height) steps using the RW on the therapy gym stairs with pt requiring min/mod assist for balance and AD management with max cuing for sequencing - therapist/family decided this was not a proper simulation for his home entry since he has 2steps then a platform and then 1 more step-up. Therapist placed 4" height platform on top of the 6" height platform to better resemble pt's home entry. Ascended/descended using RW with min assist for AD management and on repeated trials performed with CGA and min cuing for proper AD management and hand placement on RW - pt's family able to safely cue him throughout. Pt performed this again with his wife providing proper CGA for balance and with supervision from therapist. Pt/wife demonstrated safe technique and denied questions - both report feeling confident with this technique. Pt ambulated ~172ft using RW with pt's wife providing proper CGA for steadying - min progressed  to no cuing from therapist. Performed simulated car transfer (SUV height) with pt's wife providing proper CGA for steadying with minimal to no cuing from therapist. Pt/family report feeling much more confident with assisting pt safely at home after training and have no questions/concerns. Transported back to room and pt left seated in w/c with needs in reach and seat belt alarm on.   Therapy Documentation Precautions:  Precautions Precautions: Fall, Back Required Braces or Orthoses:  (no brace) Restrictions Weight Bearing Restrictions: No  Pain: No reports of pain throughout session.   Therapy/Group: Individual Therapy  Tawana Scale, PT, DPT 07/06/2019, 8:04 AM

## 2019-07-06 NOTE — Progress Notes (Signed)
Per Provider progress note, pt is suppose to wear PRAFO boot while in the bed. Per chart review, there is no order in place for the [facility] boot to be worn. Requesting for the provider to place the order.

## 2019-07-06 NOTE — Progress Notes (Signed)
Occupational Therapy Session Note  Patient Details  Name: Alan Riley MRN: [id number] Date of Birth: [birth year]  Today's Date: 07/06/2019 OT Individual Time: 0800-0900 OT Individual Time Calculation (min): 60 min    Short Term Goals: Week 1:  OT Short Term Goal 1 (Week 1): STGs=LTGs secondary to estimated short LOS  Skilled Therapeutic Interventions/Progress Updates:    1:1. Pt, daugter and wife present for family educaiton. Daughter fluent in both [place] and bosnian and willing to translate. OT edu re OT role/purpose, log rolling, back precautions (with demo), AE training, toilet transfers, DME recommendations and RW safety. Pt demo stand pivot transfer and toilet transfer with OT using RW and OT providing supervision and using guarding A at hips. Pt wife and daughter able to return demo to/from the toilet with AFO donned. Pt completes dressing at sink with set up for shirt and MIN A for threading 1LE into underwear using reacher. OT demo reacher, sock aide and shoe funnel use. Pt able to return demo to doff B socks, don B socks with VC and don L shoe with shoe funnel. Wife able to provide A to use shoe funnel to don R shoe and AFO. No further questions, pt left in w/c with family present awaiting PT arrival  Therapy Documentation Precautions:  Precautions Precautions: Fall, Back Required Braces or Orthoses:  (no brace) Restrictions Weight Bearing Restrictions: No General:   Vital Signs: Therapy Vitals Temp: 98.3 F (36.8 C) Pulse Rate: (!) 50 Resp: 17 BP: (!) 141/75 Patient Position (if appropriate): Lying Oxygen Therapy SpO2: 99 % O2 Device: Room Air Pain:   ADL:   Vision   Perception    Praxis   Exercises:   Other Treatments:     Therapy/Group: Individual Therapy  Tonny Branch 07/06/2019, 8:59 AM

## 2019-07-07 MED ORDER — DEXAMETHASONE 0.5 MG PO TABS
1.0000 mg | ORAL_TABLET | Freq: Two times a day (BID) | ORAL | Status: DC
  Administered 2019-07-08 – 2019-07-09 (×3): 1 mg via ORAL
  Filled 2019-07-07 (×4): qty 2

## 2019-07-07 MED ORDER — DEXAMETHASONE 4 MG PO TABS
2.0000 mg | ORAL_TABLET | Freq: Two times a day (BID) | ORAL | Status: AC
  Administered 2019-07-07: 2 mg via ORAL
  Filled 2019-07-07: qty 1

## 2019-07-07 NOTE — Plan of Care (Signed)
  Problem: Consults Goal: RH SPINAL CORD INJURY PATIENT EDUCATION Description:  See Patient Education module for education specifics.  Outcome: Progressing   Problem: SCI BOWEL ELIMINATION Goal: RH STG MANAGE BOWEL WITH ASSISTANCE Description: STG Manage Bowel with min Assistance. Outcome: Progressing   Problem: SCI BLADDER ELIMINATION Goal: RH STG MANAGE BLADDER WITH ASSISTANCE Description: STG Manage Bladder With min Assistance Outcome: Progressing   Problem: RH SKIN INTEGRITY Goal: RH STG SKIN FREE OF INFECTION/BREAKDOWN Description: Supervision/cues/reminders Outcome: Progressing Goal: RH STG MAINTAIN SKIN INTEGRITY WITH ASSISTANCE Description: STG Maintain Skin Integrity With min Assistance. Outcome: Progressing   Problem: RH SAFETY Goal: RH STG ADHERE TO SAFETY PRECAUTIONS W/ASSISTANCE/DEVICE Description: STG Adhere to Safety Precautions With mod I Assistance/Device. Outcome: Progressing Goal: RH STG DECREASED RISK OF FALL WITH ASSISTANCE Description: STG Decreased Risk of Fall With mod I Assistance. Outcome: Progressing   Problem: RH PAIN MANAGEMENT Goal: RH STG PAIN MANAGED AT OR BELOW PT'S PAIN GOAL Description: Less than 3 out of 10 Outcome: Progressing   Problem: RH KNOWLEDGE DEFICIT SCI Goal: RH STG INCREASE KNOWLEDGE OF SELF CARE AFTER SCI Description: Patient will be able to manage self care at discharge using handouts. Resources and educational instruction with cues/reminders/supervision  Outcome: Progressing

## 2019-07-07 NOTE — Progress Notes (Signed)
[place] PHYSICAL MEDICINE & REHABILITATION PROGRESS NOTE  Subjective/Complaints: Just ate breakfast. rn giving meds. No new complaints. Says family ed went well  ROS: Patient denies fever, rash, sore throat, blurred vision, nausea, vomiting, diarrhea, cough, shortness of breath or chest pain, joint or back pain, headache, or mood change. .   Objective: Vital Signs: Blood pressure 134/73, pulse (!) 50, temperature 97.8 F (36.6 C), temperature source Oral, resp. rate 18, SpO2 99 %. No results found. No results for input(s): WBC, HGB, HCT, PLT in the last 72 hours. No results for input(s): NA, K, CL, CO2, GLUCOSE, BUN, CREATININE, CALCIUM in the last 72 hours.  Physical Exam: BP 134/73 (BP Location: Left Arm)   Pulse (!) 50   Temp 97.8 F (36.6 C) (Oral)   Resp 18   SpO2 99%  Constitutional: No distress . Vital signs reviewed. HEENT: EOMI, oral membranes moist Neck: supple Cardiovascular: RRR without murmur. No JVD    Respiratory/Chest: CTA Bilaterally without wheezes or rales. Normal effort    GI/Abdomen: BS +, non-tender, non-distended Ext: no clubbing, cyanosis, or edema Psych: pleasant and cooperative Skin: surg site CDI  Psych: Normal mood.  Normal behavior. Musc: No edema in extremities.  No tenderness in extremities. Neurological: Alert Motor: Left lower extremity: 4+/5 proximal to distal Right lower extremity: Hip flexion, knee extension 4/5, ankle dorsiflexion remains 1/5 -wearing PRAFO   Assessment/Plan: 1. Functional deficits secondary to radiculopathy which require 3+ hours per day of interdisciplinary therapy in a comprehensive inpatient rehab setting.  Physiatrist is providing close team supervision and 24 hour management of active medical problems listed below.  Physiatrist and rehab team continue to assess barriers to discharge/monitor patient progress toward functional and medical goals  Care Tool:  Bathing    Body parts bathed by patient: Right  arm, Left arm, Chest, Abdomen, Front perineal area, Right upper leg, Left upper leg, Right lower leg, Left lower leg, Face (using long handled sponge)   Body parts bathed by helper: Buttocks     Bathing assist Assist Level: Minimal Assistance - Patient > 75%     Upper Body Dressing/Undressing Upper body dressing   What is the patient wearing?: Pull over shirt    Upper body assist Assist Level: Set up assist    Lower Body Dressing/Undressing Lower body dressing      What is the patient wearing?: Incontinence brief, Pants     Lower body assist Assist for lower body dressing: Contact Guard/Touching assist     Toileting Toileting    Toileting assist Assist for toileting: Minimal Assistance - Patient > 75% (for bowel pericare)     Transfers Chair/bed transfer  Transfers assist     Chair/bed transfer assist level: Contact Guard/Touching assist     Locomotion Ambulation   Ambulation assist      Assist level: Contact Guard/Touching assist Assistive device: Walker-rolling Max distance: 192ft   Walk 10 feet activity   Assist  Walk 10 feet activity did not occur: Safety/medical concerns  Assist level: Contact Guard/Touching assist Assistive device: Walker-rolling   Walk 50 feet activity   Assist Walk 50 feet with 2 turns activity did not occur: Safety/medical concerns  Assist level: Contact Guard/Touching assist Assistive device: Walker-rolling    Walk 150 feet activity   Assist Walk 150 feet activity did not occur: Safety/medical concerns         Walk 10 feet on uneven surface  activity   Assist Walk 10 feet on uneven surfaces activity did  not occur: Safety/medical concerns         Wheelchair     Assist   Type of Wheelchair: Manual    Wheelchair assist level: Supervision/Verbal cueing Max wheelchair distance: 154ft    Wheelchair 50 feet with 2 turns activity    Assist        Assist Level: Supervision/Verbal cueing    Wheelchair 150 feet activity     Assist     Assist Level: Supervision/Verbal cueing      Medical Problem List and Plan: 1.  Gait disturbance secondary to lumbar cervical stenosis with radiculopathy.S/P decompressive anterior cervical discectomy C4-5 with arthrodesis and lumbar laminectomy with foraminotomies L3-4 with discectomy 06/25/2019.  No brace required  Continue CIR, family ed this weekend  PRAFO nightly, discussed with nursing director  Weaning steroids to off, decreased further on 6/11--change decadron to 1mg  q12 tomorrow 2.  Antithrombotics: -DVT/anticoagulation: SCDs.    Vascular study negative for DVT             -antiplatelet therapy: N/A 3. Pain Management: Oxycodone and Robaxin as needed  Controlled on 6/12             Monitor with increased exertion 4. Mood: Provide emotional support             -antipsychotic agents: N/A 5. Neuropsych: This patient is capable of making decisions on his own behalf. 6. Skin/Wound Care: Routine skin checks 7. Fluids/Electrolytes/Nutrition: Routine in and outs.   8.  Urinary retention.  Flomax 0.4 mg daily.    Foley DC'd  PVRs not completed, but appears to be voiding without difficulty  UA negative, urine culture no growth 9.  Hyperlipidemia.  Resume Lipitor 10.  Drug-induced constipation  Bowel meds increased on 6/5 with good improvement, decreased on 6/6  6/13 moving bowels regularly               11. Steroid-induced hyperglycemia on prediabetes  Elevated on 6/7, labs ordered for Monday  Continue to monitor 12.  Elevated blood pressure  Controlled on 6/13 13. Leukocytosis-likely secondary to steroids  See #8  WBC 13.0 on 6/8  Afebrile  Continue to monitor 14.  Hypoalbuminemia  Supplement initiated on 6/10  LOS: 9 days A FACE TO FACE EVALUATION WAS PERFORMED  Meredith Staggers 07/07/2019, 8:14 AM

## 2019-07-08 ENCOUNTER — Inpatient Hospital Stay (HOSPITAL_COMMUNITY): Payer: Medicare HMO | Admitting: Physical Therapy

## 2019-07-08 ENCOUNTER — Inpatient Hospital Stay (HOSPITAL_COMMUNITY): Payer: Medicare HMO | Admitting: Occupational Therapy

## 2019-07-08 LAB — BASIC METABOLIC PANEL
Anion gap: 8 (ref 5–15)
BUN: 17 mg/dL (ref 8–23)
CO2: 29 mmol/L (ref 22–32)
Calcium: 8.2 mg/dL — ABNORMAL LOW (ref 8.9–10.3)
Chloride: 97 mmol/L — ABNORMAL LOW (ref 98–111)
Creatinine, Ser: 0.59 mg/dL — ABNORMAL LOW (ref 0.61–1.24)
GFR calc Af Amer: >60 mL/min (ref >60)
GFR calc non Af Amer: >60 mL/min (ref >60)
Glucose, Bld: 111 mg/dL — ABNORMAL HIGH (ref 70–99)
Potassium: 3.4 mmol/L — ABNORMAL LOW (ref 3.5–5.1)
Sodium: 134 mmol/L — ABNORMAL LOW (ref 135–145)

## 2019-07-08 NOTE — Progress Notes (Signed)
Physical Therapy Session Note  Patient Details  Name: Alan Riley MRN: [id number] Date of Birth: Sep 05, 1945  Today's Date: 07/08/2019 PT Individual Time: 1121-6244 PT Individual Time Calculation (min): 75 min   Short Term Goals: Week 2:  PT Short Term Goal 1 (Week 2): STG = LTGs due to remaining ELOS  Skilled Therapeutic Interventions/Progress Updates:    Patient received up in [facility], pleasant and willing to participate in PT, but very fatigued this afternoon- required multiple sitting rest breaks in WC this afternoon. Practiced all aspects of functional mobility, including bed mobility, functional transfers, car transfers, navigation of ramp and mulch, and WC navigation; had already walked with this therapist this morning and declines further stair practice (partly due to fatigue and partly due to having already spent quite a bit of time practicing steps in prior session with this and other therapists). Provided ongoing education on mobility to enforce back precautions, improve efficacy and safety of mobility, and reduce discomfort with mobility. Demonstrated ability to perform at goal level with exception of steps, for which he still requires MinA for RW management/balance. Left in bed with all needs met and questions addressed this afternoon, bed alarm active.   Therapy Documentation Precautions:  Precautions Precautions: Fall, Back Required Braces or Orthoses:  (no brace) Restrictions Weight Bearing Restrictions: No  Pain: Pain Assessment Pain Scale: 0-10 Pain Score: 0-No pain     Therapy/Group: Individual Therapy   Windell Norfolk, DPT, PN1   Supplemental Physical Therapist [place]    Pager [phone] Acute Rehab Office [phone]    07/08/2019, 3:45 PM

## 2019-07-08 NOTE — Progress Notes (Signed)
Physical Therapy Session Note  Patient Details  Name: Alan Riley MRN: [id number] Date of Birth: [birth year]  Today's Date: 07/08/2019 PT Individual Time: 1000-1055 PT Individual Time Calculation (min): 55 min   Short Term Goals: Week 2:  PT Short Term Goal 1 (Week 2): STG = LTGs due to remaining ELOS  Skilled Therapeutic Interventions/Progress Updates:    Patient received up in [facility], pleasant and willing to participate in therapy; education provided regarding back precautions as well as general process of discharge next day. He had a very busy morning and was more fatigued than usual, needed more rest breaks than his usual. Focused on gait training today and able to gait train 147f then 1527fwith RW and S with cues for safe RW use and proximity to RW as well as ongoing cues for turns with RW; needed extra time for gait training today as well. Tolerated riding nustep 6 minutes on resistance 3 with BLEs only. Able to perform functional transfers with general supervision and RW today with intermittent cues for precautions and safety. [facility], all needs otherwise met this morning.   Therapy Documentation Precautions:  Precautions Precautions: Fall, Back Required Braces or Orthoses:  (no brace) Restrictions Weight Bearing Restrictions: No   Pain: Pain Assessment Pain Scale: 0-10 Pain Score: 0-No pain Faces Pain Scale: Hurts a little bit Pain Type: Acute pain Pain Location: Abdomen    Therapy/Group: Individual Therapy   KrWindell NorfolkDPT, PN1   Supplemental Physical Therapist [place]  Pager [phone] Rehab Office [phone]  07/08/2019, 12:26 PM

## 2019-07-08 NOTE — Progress Notes (Signed)
[place] PHYSICAL MEDICINE & REHABILITATION PROGRESS NOTE  Subjective/Complaints: Patient seen sitting up in bed this morning.  He ate indicates he slept well overnight.  He appears to deny any complaints and is aware of discharge plan for tomorrow.  He states his daughter is going back to [place] today, but his son will be present.  ROS: Denies CP, SOB, N/V/D  Objective: Vital Signs: Blood pressure (!) 147/71, pulse (!) 49, temperature (!) 97.5 F (36.4 C), resp. rate 17, SpO2 99 %. No results found. No results for input(s): WBC, HGB, HCT, PLT in the last 72 hours. No results for input(s): NA, K, CL, CO2, GLUCOSE, BUN, CREATININE, CALCIUM in the last 72 hours.  Physical Exam: BP (!) 147/71 (BP Location: Left Arm)   Pulse (!) 49   Temp (!) 97.5 F (36.4 C)   Resp 17   SpO2 99%  Constitutional: No distress . Vital signs reviewed. HENT: Normocephalic.  Atraumatic. Eyes: EOMI. No discharge. Cardiovascular: No JVD. Respiratory: Normal effort.  No stridor. GI: Non-distended. Skin: Surgical site C/D/I Psych: Normal mood.  Normal behavior. Musc: No edema in extremities.  No tenderness in extremities. Neurological: Alert  Motor: Left lower extremity: 4+/5 proximal to distal Right lower extremity: Hip flexion, knee extension 4/5, ankle dorsiflexion 1/5, unchanged  Assessment/Plan: 1. Functional deficits secondary to radiculopathy which require 3+ hours per day of interdisciplinary therapy in a comprehensive inpatient rehab setting.  Physiatrist is providing close team supervision and 24 hour management of active medical problems listed below.  Physiatrist and rehab team continue to assess barriers to discharge/monitor patient progress toward functional and medical goals  Care Tool:  Bathing    Body parts bathed by patient: Right arm, Left arm, Chest, Abdomen, Front perineal area, Right upper leg, Left upper leg, Right lower leg, Left lower leg, Face (using long handled sponge)    Body parts bathed by helper: Buttocks     Bathing assist Assist Level: Minimal Assistance - Patient > 75%     Upper Body Dressing/Undressing Upper body dressing   What is the patient wearing?: Pull over shirt    Upper body assist Assist Level: Set up assist    Lower Body Dressing/Undressing Lower body dressing      What is the patient wearing?: Incontinence brief, Pants     Lower body assist Assist for lower body dressing: Contact Guard/Touching assist     Toileting Toileting    Toileting assist Assist for toileting: Minimal Assistance - Patient > 75% (for bowel pericare)     Transfers Chair/bed transfer  Transfers assist     Chair/bed transfer assist level: Contact Guard/Touching assist     Locomotion Ambulation   Ambulation assist      Assist level: Contact Guard/Touching assist Assistive device: Walker-rolling Max distance: 131ft   Walk 10 feet activity   Assist  Walk 10 feet activity did not occur: Safety/medical concerns  Assist level: Contact Guard/Touching assist Assistive device: Walker-rolling   Walk 50 feet activity   Assist Walk 50 feet with 2 turns activity did not occur: Safety/medical concerns  Assist level: Contact Guard/Touching assist Assistive device: Walker-rolling    Walk 150 feet activity   Assist Walk 150 feet activity did not occur: Safety/medical concerns         Walk 10 feet on uneven surface  activity   Assist Walk 10 feet on uneven surfaces activity did not occur: Safety/medical concerns         Wheelchair  Assist   Type of Wheelchair: Manual    Wheelchair assist level: Supervision/Verbal cueing Max wheelchair distance: 115ft    Wheelchair 50 feet with 2 turns activity    Assist        Assist Level: Supervision/Verbal cueing   Wheelchair 150 feet activity     Assist     Assist Level: Supervision/Verbal cueing      Medical Problem List and Plan: 1.  Gait  disturbance secondary to lumbar cervical stenosis with radiculopathy.S/P decompressive anterior cervical discectomy C4-5 with arthrodesis and lumbar laminectomy with foraminotomies L3-4 with discectomy 06/25/2019.  No brace required  Continue CIR  PRAFO nightly  Weaning steroids, decrease further on 6/14  Communicated with therapies, attempted to call daughter, however no answer-left voicemail and encouraged to call back. 2.  Antithrombotics: -DVT/anticoagulation: SCDs.    Vascular study negative for DVT             -antiplatelet therapy: N/A 3. Pain Management: Oxycodone and Robaxin as needed  Controlled on 6/14             Monitor with increased exertion 4. Mood: Provide emotional support             -antipsychotic agents: N/A 5. Neuropsych: This patient is capable of making decisions on his own behalf. 6. Skin/Wound Care: Routine skin checks 7. Fluids/Electrolytes/Nutrition: Routine in and outs.   8.  Urinary retention.  Flomax 0.4 mg daily.    Foley DC'd  PVRs not completed, but appears to be voiding without difficulty  UA negative, urine culture no growth 9.  Hyperlipidemia.  Resume Lipitor 10.  Drug-induced constipation  Bowel meds increased on 6/5 with good improvement, decreased on 6/6  Improved              11. Steroid-induced hyperglycemia on prediabetes  Elevated on 6/7, labs pending  Continue to monitor 12.  Elevated blood pressure  Controlled on 6/14 13. Leukocytosis-likely secondary to steroids  See #8  WBC 13.0 on 6/8  Afebrile  Continue to monitor 14.  Hypoalbuminemia  Supplement initiated on 6/10  LOS: 10 days A FACE TO FACE EVALUATION WAS PERFORMED  Sara Selvidge Lorie Phenix 07/08/2019, 8:20 AM

## 2019-07-08 NOTE — Progress Notes (Signed)
Occupational Therapy Discharge Summary  Patient Details  Name: Alan Riley MRN: [id number] Date of Birth: [birth year]  Today's Date: 07/08/2019 OT Individual Time: 0900-1000 OT Individual Time Calculation (min): 60 min    Patient has met 6 of 10 long term goals due to improved activity tolerance, improved balance, postural control, ability to compensate for deficits and improved awareness.  Patient to discharge at [facility] Assist level.  Patient's care partner is independent to provide the necessary physical and cognitive assistance at discharge.    Reasons goals not met: Pt requires min assist to bathe and complete pericare of buttocks due to spinal precautions. Pt needs min assist for LB dressing to donn/doff right AFO and shoe.  Pt will have appropriate level of assist from wife upon dc to home setting.  Recommendation:  Patient will benefit from ongoing skilled OT services in home health setting to continue to advance functional skills in the area of BADL and iADL.  Equipment: bedside commode, rolling walker, wheelchair, walker bag, reacher, long handled sponge  Reasons for discharge: treatment goals met  Patient/family agrees with progress made and goals achieved: Yes  OT Discharge Precautions/Restrictions Spinal precautions   Skilled Intervention:  Pt sitting up in w/c agreeable to OT with no complaints of pain.  Pt bathed and dressed at sinkside using AE as needed. Pt independent to doff shirt and supervision to initiate use of wash cloth and long handled sponge to bathe UB including back.  Pt required supervision to doff pants and underwear in standing and sitting with use of reacher to pull over feet.  Pt bathed LB using long handled sponge for feet and needed min assist to wash buttocks without twisting due to spinal precautions.  Pt donned shirt with setup and donned underwear and pants using reacher needing supervision with mod VCs for compensatory technique implementation  (gathering pant leg prior to grasping with reacher).  Pt needed SBA to donn socks using sock aide to fully apply sock to AE.  Pt needed max assist to donn right shoe and AFO using shoe cuff and mod assist to donn left shoe using shoe cuff. DME delivered during OT session therefore educated pt on proper adjustment technique of BSC and OT applied RW bag onto personal device.  Educated pt on where to procure sock aide and shoe cuff upon dc to home setting.  Pt left sitting up in w/c with seat belt alarm donned/on, call bell in reach.  Pain Pain Assessment Pain Scale: 0-10 Pain Score: 0-No pain ADL ADL Equipment Provided: Reacher, Long-handled sponge, Other (comment) (walker bag) Eating: Independent Where Assessed-Eating: Chair Grooming: Independent Where Assessed-Grooming: Sitting at sink Upper Body Bathing: Supervision/safety Where Assessed-Upper Body Bathing: Sitting at sink (using long handled sponge) Lower Body Bathing: Minimal assistance (for buttocks) Where Assessed-Lower Body Bathing: Standing at sink, Sitting at sink Upper Body Dressing: Setup Where Assessed-Upper Body Dressing: Sitting at sink Lower Body Dressing: Minimal assistance (to donn right shoe and AFO; use of reacher and shoe cuff) Where Assessed-Lower Body Dressing: Sitting at sink, Standing at sink Toileting: Minimal assistance (for pericare after bowel movement) Where Assessed-Toileting: Bedside Commode (placed over toilet) Toilet Transfer: Close supervision Toilet Transfer Method: Counselling psychologist: Bedside commode Tub/Shower Transfer: Close supervison ADL Comments: Pt will be bathing at sinkside due to full bathtub is on second floor. Vision Baseline Vision/History: Wears glasses Wears Glasses: At all times Patient Visual Report: No change from baseline Vision Assessment?: No apparent visual deficits Eye Alignment: Within Functional  Limits Tracking/Visual Pursuits: Able to track stimulus in all  quads without difficulty Perception  Perception: Within Functional Limits Praxis Praxis: Intact Cognition Overall Cognitive Status: Within Functional Limits for tasks assessed Arousal/Alertness: Awake/alert Orientation Level: Oriented X4 Memory: Appears intact Awareness: Appears intact Problem Solving: Appears intact Sensation Sensation Light Touch: Appears Intact Hot/Cold: Appears Intact Proprioception: Appears Intact (BUE) Additional Comments: foot drop RLE Coordination Gross Motor Movements are Fluid and Coordinated: Yes Fine Motor Movements are Fluid and Coordinated: Yes Finger Nose Finger Test: WFL BUE Motor  Motor Motor - Skilled Clinical Observations: RLE foot drop all other MM symmetrical R and L, but mild ataxia with gait Mobility  Bed Mobility Bed Mobility: Rolling Right;Rolling Left;Left Sidelying to Sit;Sit to Supine Rolling Right: Supervision/verbal cueing Rolling Left: Supervision/Verbal cueing Right Sidelying to Sit: Supervision/Verbal cueing Left Sidelying to Sit: Supervision/Verbal cueing Supine to Sit: Supervision/Verbal cueing Sit to Supine: Supervision/Verbal cueing Transfers Sit to Stand: Supervision/Verbal cueing  Trunk/Postural Assessment  Cervical Assessment Cervical Assessment: Within Functional Limits Thoracic Assessment Thoracic Assessment: Within Functional Limits Lumbar Assessment Lumbar Assessment: Within Functional Limits Postural Control Postural Control: Within Functional Limits  Balance Balance Balance Assessed: Yes Static Sitting Balance Static Sitting - Balance Support: No upper extremity supported Static Sitting - Level of Assistance: 7: Independent Dynamic Sitting Balance Dynamic Sitting - Balance Support: No upper extremity supported Dynamic Sitting - Level of Assistance: 5: Stand by assistance Static Standing Balance Static Standing - Balance Support: No upper extremity supported Static Standing - Level of Assistance: 5:  Stand by assistance Dynamic Standing Balance Dynamic Standing - Balance Support: No upper extremity supported Dynamic Standing - Level of Assistance: 5: Stand by assistance Extremity/Trunk Assessment RUE Assessment RUE Assessment: Within Functional Limits LUE Assessment LUE Assessment: Within Functional Limits   Alan Riley Alan Riley 07/08/2019, 2:45 PM

## 2019-07-08 NOTE — Progress Notes (Signed)
Physical Therapy Discharge Summary  Patient Details  Name: Alan Riley MRN: [id number] Date of Birth: [birth year]  Today's Date: 07/08/2019 PT Individual Time: 7092-9574 PT Individual Time Calculation (min): 75 min    Patient has met 7 of 8 long term goals due to improved activity tolerance, improved balance, improved postural control, increased strength, decreased pain, ability to compensate for deficits and improved coordination.  Patient to discharge at an ambulatory level generally supervision but does still need MinA for RW management on stairs .   Patient's care partner is independent to provide the necessary physical assistance at discharge.  Reasons goals not met: continues to need MinA for RW management with stair navigation, also have only been working on 3 steps due to fatigue and patient planning to stay on first level/not needing to do flight of steps to get to second floor   Recommendation:  Patient will benefit from ongoing skilled PT services in home health setting to continue to advance safe functional mobility, address ongoing impairments in gait and step navigation, strength, to maintain functional precautions, and minimize fall risk.  Equipment: transport chair, RW, BSC, AFO   Reasons for discharge: treatment goals met and discharge from hospital  Patient/family agrees with progress made and goals achieved: Yes  PT Discharge Precautions/Restrictions Precautions Precautions: Fall;Back Precaution Comments: PT verbally reviews precautions with use of IT consultant, pt requires cues for no lifting but is able to recall other 2 back precautions Restrictions Weight Bearing Restrictions: No Vital Signs Therapy Vitals Temp: 98.6 F (37 C) Pulse Rate: 78 Resp: 18 BP: 120/68 Patient Position (if appropriate): Sitting Oxygen Therapy SpO2: 99 % O2 Device: Room Air Pain Pain Assessment Pain Scale: 0-10 Pain Score: 0-No pain Vision/Perception  Vision -  Assessment Eye Alignment: Within Functional Limits Tracking/Visual Pursuits: Able to track stimulus in all quads without difficulty Saccades: Within functional limits Convergence: Within functional limits Perception Perception: Within Functional Limits Praxis Praxis: Intact  Cognition Overall Cognitive Status: Within Functional Limits for tasks assessed Arousal/Alertness: Awake/alert Orientation Level: Oriented X4 Memory: Appears intact Awareness: Appears intact Problem Solving: Appears intact Safety/Judgment: Appears intact Sensation Sensation Light Touch: Appears Intact Peripheral sensation comments: distal parasthesia Light Touch Impaired Details: Impaired RLE Hot/Cold: Appears Intact Proprioception: Appears Intact Stereognosis: Appears Intact Additional Comments: foot drop RLE Coordination Gross Motor Movements are Fluid and Coordinated: Yes Fine Motor Movements are Fluid and Coordinated: Yes Finger Nose Finger Test: WFL BUE Heel Shin Test: symmetric R and L, but noted foot drop Motor  Motor Motor - Skilled Clinical Observations: RLE foot drop all other MM symmetrical R and L, but mild ataxia with gait Motor - Discharge Observations: RLE foot drop, no paraplegia or ataxia otherwise noted today  Mobility Bed Mobility Bed Mobility: Rolling Right;Rolling Left;Right Sidelying to Sit;Sit to Sidelying Right;Sit to Sidelying Left;Left Sidelying to Sit Rolling Right: Supervision/verbal cueing Rolling Left: Supervision/Verbal cueing Right Sidelying to Sit: Supervision/Verbal cueing Left Sidelying to Sit: Supervision/Verbal cueing Supine to Sit: Supervision/Verbal cueing Sit to Supine: Supervision/Verbal cueing Sit to Sidelying Right: Supervision/Verbal cueing Sit to Sidelying Left: Supervision/Verbal cueing Transfers Transfers: Sit to Stand;Stand Pivot Transfers Sit to Stand: Supervision/Verbal cueing Stand Pivot Transfers: Supervision/Verbal cueing Stand Pivot Transfer  Details: Verbal cues for safe use of DME/AE;Verbal cues for precautions/safety Transfer (Assistive device): Rolling walker Locomotion  Gait Ambulation: Yes Gait Assistance: Supervision/Verbal cueing Gait Distance (Feet): 165 Feet Assistive device: Rolling walker Gait Assistance Details: Verbal cues for gait pattern;Verbal cues for safe use of DME/AE Gait Gait: Yes Gait  Pattern: Step-through pattern;Decreased step length - left;Decreased dorsiflexion - right;Right foot flat Stairs / Additional Locomotion Stairs: Yes Stairs Assistance: Minimal Assistance - Patient > 75% Stair Management Technique: With walker Number of Stairs: 3 Height of Stairs: 4 Ramp: Contact Guard/touching assist Curb: Minimal Assistance - Patient >75% Wheelchair Mobility Wheelchair Mobility: Yes Wheelchair Assistance: Chartered loss adjuster: Both upper extremities Wheelchair Parts Management: Supervision/cueing Distance: >144f  Trunk/Postural Assessment  Cervical Assessment Cervical Assessment: Within Functional Limits Thoracic Assessment Thoracic Assessment: Within Functional Limits Lumbar Assessment Lumbar Assessment: Within Functional Limits Postural Control Postural Control: Within Functional Limits  Balance Balance Balance Assessed: Yes Static Sitting Balance Static Sitting - Balance Support: No upper extremity supported Static Sitting - Level of Assistance: 7: Independent Dynamic Sitting Balance Dynamic Sitting - Balance Support: No upper extremity supported Dynamic Sitting - Level of Assistance: 5: Stand by assistance Static Standing Balance Static Standing - Balance Support: No upper extremity supported Static Standing - Level of Assistance: 5: Stand by assistance Dynamic Standing Balance Dynamic Standing - Balance Support: No upper extremity supported Dynamic Standing - Level of Assistance: 5: Stand by assistance Extremity Assessment  RUE Assessment RUE  Assessment: Within Functional Limits LUE Assessment LUE Assessment: Within Functional Limits RLE Assessment RLE Assessment: Exceptions to WWarm [facility] AntonioGeneral Strength Comments: quad and sitting hip abduction 5/5, DF trace LLE Assessment LLE Assessment: Within Functional Limits General Strength Comments: 5/5 grossly    KWindell Norfolk DPT, PN1   Supplemental Physical Therapist [place]   Pager [phone] Rehab Office [phone]

## 2019-07-09 DIAGNOSIS — E876 Hypokalemia: Secondary | ICD-10-CM

## 2019-07-09 MED ORDER — SENNOSIDES-DOCUSATE SODIUM 8.6-50 MG PO TABS: 1.0000 | ORAL_TABLET | Freq: Every day | ORAL | 0 refills | Status: DC

## 2019-07-09 MED ORDER — POTASSIUM CHLORIDE CRYS ER 20 MEQ PO TBCR
30.0000 meq | EXTENDED_RELEASE_TABLET | Freq: Once | ORAL | Status: AC
  Administered 2019-07-09: 30 meq via ORAL
  Filled 2019-07-09: qty 1

## 2019-07-09 MED ORDER — DOCUSATE SODIUM 100 MG PO CAPS: 100.0000 mg | ORAL_CAPSULE | Freq: Two times a day (BID) | ORAL | 0 refills | Status: DC

## 2019-07-09 MED ORDER — TAMSULOSIN HCL 0.4 MG PO CAPS: 0.4000 mg | ORAL_CAPSULE | Freq: Every day | ORAL | 0 refills | Status: DC

## 2019-07-09 MED ORDER — ATORVASTATIN CALCIUM 20 MG PO TABS: 20.0000 mg | ORAL_TABLET | Freq: Every day | ORAL | 0 refills | Status: AC

## 2019-07-09 MED ORDER — DEXAMETHASONE 1 MG PO TABS: ORAL_TABLET | ORAL | 0 refills | Status: DC

## 2019-07-09 NOTE — Progress Notes (Addendum)
PHYSICAL MEDICINE & REHABILITATION PROGRESS NOTE  Subjective/Complaints: Patient seen laying in bed this morning.  He indicates he slept well overnight.  He indicates he is looking forward to discharge.  ROS: Denies CP, SOB, N/V/D  Objective: Vital Signs: Blood pressure 122/67, pulse (!) 53, temperature 98.6 F (37 C), resp. rate 17, SpO2 98 %. No results found. No results for input(s): WBC, HGB, HCT, PLT in the last 72 hours. Recent Labs    07/08/19 0900  NA 134*  K 3.4*  CL 97*  CO2 29  GLUCOSE 111*  BUN 17  CREATININE 0.59*  CALCIUM 8.2*    Physical Exam: BP 122/67   Pulse (!) 53   Temp 98.6 F (37 C)   Resp 17   SpO2 98%  Constitutional: No distress . Vital signs reviewed. HENT: Normocephalic.  Atraumatic. Eyes: EOMI. No discharge. Cardiovascular: No JVD. Respiratory: Normal effort.  No stridor. GI: Non-distended. Skin: Surgical sites C/D/I Psych: Normal mood.  Normal behavior. Musc: No edema in extremities.  No tenderness in extremities. Neurological: Alert Motor: Left lower extremity: 4+/5 proximal to distal Right lower extremity: Hip flexion, knee extension 4/5, ankle dorsiflexion 1/5, improving  Assessment/Plan: 1. Functional deficits secondary to radiculopathy which require 3+ hours per day of interdisciplinary therapy in a comprehensive inpatient rehab setting.  Physiatrist is providing close team supervision and 24 hour management of active medical problems listed below.  Physiatrist and rehab team continue to assess barriers to discharge/monitor patient progress toward functional and medical goals  Care Tool:  Bathing    Body parts bathed by patient: Right arm, Left arm, Chest, Abdomen, Front perineal area, Right upper leg, Left upper leg, Right lower leg, Left lower leg, Face   Body parts bathed by helper: Buttocks     Bathing assist Assist Level: Minimal Assistance - Patient > 75%     Upper Body Dressing/Undressing Upper body  dressing   What is the patient wearing?: Pull over shirt    Upper body assist Assist Level: Set up assist    Lower Body Dressing/Undressing Lower body dressing      What is the patient wearing?: Pants, Underwear/pull up     Lower body assist Assist for lower body dressing: Supervision/Verbal cueing     Toileting Toileting    Toileting assist Assist for toileting: Minimal Assistance - Patient > 75%     Transfers Chair/bed transfer  Transfers assist     Chair/bed transfer assist level: Supervision/Verbal cueing     Locomotion Ambulation   Ambulation assist      Assist level: Supervision/Verbal cueing Assistive device: Walker-rolling Max distance: 158ft   Walk 10 feet activity   Assist  Walk 10 feet activity did not occur: Safety/medical concerns  Assist level: Supervision/Verbal cueing Assistive device: Walker-rolling   Walk 50 feet activity   Assist Walk 50 feet with 2 turns activity did not occur: Safety/medical concerns  Assist level: Supervision/Verbal cueing Assistive device: Walker-rolling    Walk 150 feet activity   Assist Walk 150 feet activity did not occur: Safety/medical concerns  Assist level: Supervision/Verbal cueing Assistive device: Walker-rolling    Walk 10 feet on uneven surface  activity   Assist Walk 10 feet on uneven surfaces activity did not occur: Safety/medical concerns   Assist level: Contact Guard/Touching assist Assistive device: Aeronautical engineer   Type of Wheelchair: Manual    Wheelchair assist level: Supervision/Verbal cueing Max wheelchair distance: >143ft    Wheelchair  50 feet with 2 turns activity    Assist        Assist Level: Supervision/Verbal cueing   Wheelchair 150 feet activity     Assist     Assist Level: Supervision/Verbal cueing      Medical Problem List and Plan: 1.  Gait disturbance secondary to lumbar cervical stenosis with  radiculopathy.S/P decompressive anterior cervical discectomy C4-5 with arthrodesis and lumbar laminectomy with foraminotomies L3-4 with discectomy 06/25/2019.  No brace required  DC today  Will see patient for hospital follow-up in 1 month post-discharge  PRAFO nightly  Weaning steroids, decreased further on 6/14, decreased to 1 mg daily on 6/17 x 3 days and then stop  Daughter present for family education, however, back in [place] and son from [place] going to assist at discharge 2.  Antithrombotics: -DVT/anticoagulation: SCDs.    Vascular study negative for DVT             -antiplatelet therapy: N/A 3. Pain Management: Oxycodone and Robaxin as needed  Controlled on 6/15             Monitor with increased exertion 4. Mood: Provide emotional support             -antipsychotic agents: N/A 5. Neuropsych: This patient is capable of making decisions on his own behalf. 6. Skin/Wound Care: Routine skin checks 7. Fluids/Electrolytes/Nutrition: Routine in and outs.   8.  Urinary retention.  Flomax 0.4 mg daily.    Foley DC'd  PVRs not completed, but appears to be voiding without difficulty  UA negative, urine culture no growth 9.  Hyperlipidemia.  Resume Lipitor 10.  Drug-induced constipation  Bowel meds increased on 6/5 with good improvement, decreased on 6/6  Improved              11. Steroid-induced hyperglycemia on prediabetes  Elevated, but improving on 6/14  Continue to monitor 12.  Elevated blood pressure  Controlled on 6/15 13. Leukocytosis-likely secondary to steroids  See #8  WBC 13.0 on 6/8  Afebrile  Continue to monitor 14.  Hypoalbuminemia  Supplement initiated on 6/10 15.  Hypokalemia  Potassium 3.4 on 6/14, supplemented x1 day  > 30 minutes spent in total in discharge planning between myself and PA regarding aforementioned, as well discussion regarding DME equipment, follow-up appointments, follow-up therapies, discharge medications, discharge recommendations, family  support  LOS: 11 days A FACE TO FACE EVALUATION WAS PERFORMED  Alan Riley Lorie Phenix 07/09/2019, 8:22 AM

## 2019-07-09 NOTE — Plan of Care (Signed)
  Problem: Consults Goal: RH SPINAL CORD INJURY PATIENT EDUCATION Description:  See Patient Education module for education specifics.  Outcome: Completed/Met   Problem: SCI BOWEL ELIMINATION Goal: RH STG MANAGE BOWEL WITH ASSISTANCE Description: STG Manage Bowel with min Assistance. Outcome: Completed/Met   Problem: SCI BLADDER ELIMINATION Goal: RH STG MANAGE BLADDER WITH ASSISTANCE Description: STG Manage Bladder With min Assistance Outcome: Completed/Met   Problem: RH SKIN INTEGRITY Goal: RH STG SKIN FREE OF INFECTION/BREAKDOWN Description: Supervision/cues/reminders Outcome: Completed/Met Goal: RH STG MAINTAIN SKIN INTEGRITY WITH ASSISTANCE Description: STG Maintain Skin Integrity With min Assistance. Outcome: Completed/Met   Problem: RH SAFETY Goal: RH STG ADHERE TO SAFETY PRECAUTIONS W/ASSISTANCE/DEVICE Description: STG Adhere to Safety Precautions With mod I Assistance/Device. Outcome: Completed/Met Goal: RH STG DECREASED RISK OF FALL WITH ASSISTANCE Description: STG Decreased Risk of Fall With mod I Assistance. Outcome: Completed/Met   Problem: RH PAIN MANAGEMENT Goal: RH STG PAIN MANAGED AT OR BELOW PT'S PAIN GOAL Description: Less than 3 out of 10 Outcome: Completed/Met   Problem: RH KNOWLEDGE DEFICIT SCI Goal: RH STG INCREASE KNOWLEDGE OF SELF CARE AFTER SCI Description: Patient will be able to manage self care at discharge using handouts. Resources and educational instruction with cues/reminders/supervision  Outcome: Completed/Met

## 2019-07-09 NOTE — Progress Notes (Addendum)
Inpatient Rehabilitation Care Coordinator  Discharge Note  The overall goal for the admission was met for:   Discharge location: Yes, Home  Length of Stay: Yes, 11 Days  Discharge activity level: Yes, MIN A  Home/community participation: Yes  Services provided included: MD, RD, PT, OT, SLP, RN, CM, TR, Pharmacy and [place]: Private Insurance: [employer]  Follow-up services arranged: Home Health: [facility]  Comments (or additional information):  Patient/Family verbalized understanding of follow-up arrangements: Yes  Individual responsible for coordination of the follow-up plan: Radana, [phone]  Confirmed correct DME delivered: Dyanne Iha 07/09/2019    Dyanne Iha

## 2019-07-09 NOTE — Progress Notes (Addendum)
Discharge papers went over with patient, his son and wife. Interpreter present at bedside. All questions answered. Patient discharged with equipment. All belongings with patient. Dressing supplied given and education done on wound care/monitoring for signs of infection. Patient discharged at 1430.

## 2019-07-09 NOTE — Progress Notes (Signed)
Per order from MD, pt to have Prafo-boot placed on at night. However, pt refused, and was educated on purpose of boot. RN broached the idea of wearing boot for a couple hours, but pt still adamant on refusal.

## 2019-07-09 NOTE — Discharge Instructions (Signed)
Inpatient Rehab Discharge Instructions  Sid Greener Discharge date and time: 07/09/19   Activities/Precautions/ Functional Status: Activity: activity as tolerated Diet: regular diet Wound Care: keep wound clean and dry.  Contact MD if you develop any problems with your incision/wound--redness, swelling, increase in pain, drainage or if you develop fever or chills.   Functional status:  ___ No restrictions     ___ Walk up steps independently _X__ 24/7 supervision/assistance   ___ Walk up steps with assistance ___ Intermittent supervision/assistance  ___ Bathe/dress independently ___ Walk with walker     __X_ Bathe/dress with assistance ___ Walk Independently    ___ Shower independently ___ Walk with assistance    ___ Shower with assistance _X__ No alcohol  ___ Return to work/school ________   COMMUNITY REFERRALS UPON DISCHARGE:    Home Health:   PT     OT                     Agency: [facility] Phone: [phone]    Medical Equipment/Items Ordered: Bedside Commode, Conservation officer, nature, Transport Chair                                                 Agency/Supplier: [employer]  Special Instructions: 1. No driving smoking or alcohol 2. Needs hands on/min assist with all activity.   My questions have been answered and I understand these instructions. I will adhere to these goals and the provided educational materials after my discharge from the hospital.  Patient/Caregiver Signature _______________________________ Date __________  Clinician Signature _______________________________________ Date __________  Please bring this form and your medication list with you to all your follow-up doctor's appointments.

## 2019-07-09 NOTE — Progress Notes (Signed)
Patient ID: Alan Riley, male   DOB: 11-02-1945, 74 y.o.   MRN: [id number]  Patient set up with [facility], orders sent

## 2019-07-14 DIAGNOSIS — M4802 Spinal stenosis, cervical region: Secondary | ICD-10-CM | POA: Diagnosis not present

## 2019-07-14 DIAGNOSIS — M5416 Radiculopathy, lumbar region: Secondary | ICD-10-CM | POA: Diagnosis not present

## 2019-07-14 DIAGNOSIS — D500 Iron deficiency anemia secondary to blood loss (chronic): Secondary | ICD-10-CM | POA: Diagnosis not present

## 2019-07-14 DIAGNOSIS — E785 Hyperlipidemia, unspecified: Secondary | ICD-10-CM | POA: Diagnosis not present

## 2019-07-14 DIAGNOSIS — M48061 Spinal stenosis, lumbar region without neurogenic claudication: Secondary | ICD-10-CM | POA: Diagnosis not present

## 2019-07-14 DIAGNOSIS — Z4789 Encounter for other orthopedic aftercare: Secondary | ICD-10-CM | POA: Diagnosis not present

## 2019-07-14 DIAGNOSIS — M5412 Radiculopathy, cervical region: Secondary | ICD-10-CM | POA: Diagnosis not present

## 2019-07-14 DIAGNOSIS — R339 Retention of urine, unspecified: Secondary | ICD-10-CM | POA: Diagnosis not present

## 2019-07-14 DIAGNOSIS — K5903 Drug induced constipation: Secondary | ICD-10-CM | POA: Diagnosis not present

## 2019-07-15 NOTE — Discharge Summary (Addendum)
Physician Discharge Summary  Patient ID: Alan Riley MRN: [id number] DOB/AGE: 07/12/1945 74 y.o.  Admit date: 06/28/2019 Discharge date: 07/09/2019  Discharge Diagnoses:  Principal Problem:   Radiculopathy Active Problems:   Elevated blood pressure reading   Leucocytosis   Labile blood pressure   Steroid-induced hyperglycemia   Hypoalbuminemia due to protein-calorie malnutrition (HCC)   Hypokalemia Urinary retention Hyperlipidemia   Discharged Condition: Stable  Significant Diagnostic Studies: DG Cervical Spine 2-3 Views  Result Date: 06/25/2019 CLINICAL DATA:  Cervical fusion EXAM: CERVICAL SPINE - 2-3 VIEW COMPARISON:  MRI from 06/18/2019 FLUOROSCOPY TIME:  Radiation Exposure Index (as provided by the fluoroscopic device): 4.56 mGy If the device does not provide the exposure index: Fluoroscopy Time:  12 seconds Number of Acquired Images:  1 FINDINGS: Postsurgical changes are noted at C4-5 with interbody fusion and anterior fixation. Numbering nomenclature is similar to that utilized on prior MRI. IMPRESSION: C4-5 fusion. Electronically Signed   By: Inez Catalina M.D.   On: 06/25/2019 21:17   DG Lumbar Spine 2-3 Views  Result Date: 06/25/2019 CLINICAL DATA:  Cervical fusion and lumbar laminectomy EXAM: DG C-ARM 1-60 MIN; LUMBAR SPINE - 2-3 VIEW COMPARISON:  06/20/2019 FLUOROSCOPY TIME:  Fluoroscopy Time:  6 seconds Radiation Exposure Index (if provided by the fluoroscopic device): 4.15 mGy Number of Acquired Spot Images: 1 FINDINGS: Mild anterolisthesis is again noted of L4 on L5. surgical instruments are noted just posterior to the L3-4 interspace. IMPRESSION: Intraoperative localization at L3-4. Electronically Signed   By: Inez Catalina M.D.   On: 06/25/2019 21:16   MR CERVICAL SPINE W WO CONTRAST  Result Date: 06/18/2019 CLINICAL DATA:  Benign neoplasm of spinal cord with prior resection. Right arm and hand weakness. History of colon cancer. EXAM: MRI CERVICAL SPINE WITHOUT AND WITH  CONTRAST TECHNIQUE: Multiplanar and multiecho pulse sequences of the cervical spine, to include the craniocervical junction and cervicothoracic junction, were obtained without and with intravenous contrast. CONTRAST:  72mL MULTIHANCE GADOBENATE DIMEGLUMINE 529 MG/ML IV SOLN COMPARISON:  Cervical spine radiographs 06/04/2019 FINDINGS: Alignment: Normal alignment with straightening of the cervical lordosis The patient was not able to hold still and there is significant image degradation due to motion. Vertebrae: Negative for fracture or mass. Normal enhancement of the spine. Patchy bone marrow pattern with a benign appearance. Cord: Cord compression at C4-5 without abnormal signal Cord atrophy and extensive cord hyperintensity at C5 and C6 bilaterally. No significant spinal stenosis at this level with prior laminectomy. Posterior Fossa, vertebral arteries, paraspinal tissues: Negative for mass or adenopathy in the neck. Normal flow voids in the vertebral arteries. Posterior fossa negative. Disc levels: C2-3: Small central disc protrusion and bilateral facet degeneration. Negative for stenosis C3-4: Disc degeneration and diffuse uncinate spurring. Moderate facet hypertrophy bilaterally. Moderate spinal stenosis and moderate foraminal stenosis bilaterally C4-5: Broad-based disc protrusion. Moderate facet hypertrophy. Severe spinal stenosis with compression of the cord and moderate foraminal encroachment bilaterally due to spurring. C5-6: Posterior decompression. Disc degeneration with diffuse uncinate spurring. Moderate foraminal stenosis bilaterally due to spurring. Spinal canal adequately decompressed. C6-7: Posterior laminectomy and decompression of the canal. Disc degeneration and spurring with moderate foraminal encroachment bilaterally due to spurring C7-T1: Disc degeneration and uncinate spurring. Bilateral facet degeneration. Moderate foraminal stenosis bilaterally. IMPRESSION: 1. Negative for fracture or mass  in the cervical spine. Negative for metastatic disease 2. Severe multilevel degenerative change throughout the cervical spine as above. Multilevel spinal and foraminal encroachment due to spurring 3. Severe spinal stenosis C4-5 with cord  compression and moderate foraminal encroachment bilaterally 4. Posterior decompression at C5 and C6. Chronic myelomalacia in the spinal cord at this level. No recurrent tumor. 5. These results were called by telephone at the time of interpretation on 06/18/2019 at 8:38 am to provider [facility] , who verbally acknowledged these results. Electronically Signed   By: Franchot Gallo M.D.   On: 06/18/2019 08:38   MR THORACIC SPINE W WO CONTRAST  Result Date: 06/20/2019 CLINICAL DATA:  Myelopathy. Remote history of prior resection of a benign neoplasm of the spinal cord. Right arm and hand weakness. History of colon cancer. EXAM: MRI THORACIC WITHOUT AND WITH CONTRAST TECHNIQUE: Multiplanar and multiecho pulse sequences of the thoracic spine were obtained without and with intravenous contrast. CONTRAST:  7.65mL GADAVIST GADOBUTROL 1 MMOL/ML IV SOLN COMPARISON:  None. FINDINGS: MRI THORACIC SPINE FINDINGS Alignment:  Physiologic. Vertebrae: No fracture, evidence of discitis, or bone lesion. Cord: No myelopathy. Slight indentation upon the ventral aspect of the spinal cord at T4-5 and T5-6 without discrete myelopathy. No spinal or significant foraminal stenoses. Paraspinal and other soft tissues: Negative. Disc levels: T1-2: Negative. T2-3: Small broad-based disc bulge with no neural impingement. T3-4: Normal. T4-5: Small central subligamentous disc protrusion slightly compressing the ventral aspect of the spinal cord without myelopathy. T5-6: Small central subligamentous disc protrusion slightly compressing the ventral aspect the spinal cord without myelopathy. T6-7 through T12-L1: Negative. IMPRESSION: 1. Small central subligamentous disc protrusions at T4-5 and T5-6 slightly  compressing the ventral aspect of the spinal cord without myelopathy. 2. No other significant abnormalities. Electronically Signed   By: Lorriane Shire M.D.   On: 06/20/2019 16:35   MR Lumbar Spine W Wo Contrast  Result Date: 06/20/2019 CLINICAL DATA:  Myelopathy.  Patient is unable to walk. EXAM: MRI LUMBAR SPINE WITHOUT AND WITH CONTRAST TECHNIQUE: Multiplanar and multiecho pulse sequences of the lumbar spine were obtained without and with intravenous contrast. CONTRAST:  7.96mL GADAVIST GADOBUTROL 1 MMOL/ML IV SOLN COMPARISON:  CT scan of the abdomen and pelvis dated 08/05/2014 FINDINGS: Segmentation:  5 lumbar type vertebra. Alignment:  7 mm spondylolisthesis of L4 on L5.  Otherwise normal. Vertebrae: No fracture, evidence of discitis, or significant bone lesion. Benign hemangiomata in the L2 and L5 vertebral bodies. Conus medullaris: Conus extends to the L1-2 level. Conus appears normal. Paraspinal and other soft tissues: Negative. Disc levels: T12-L1: Normal. L1-2: Normal disc. Slight hypertrophy of the ligamentum flavum and facet joints without neural impingement. L2-3: Slight disc desiccation. Small disc bulges to the right and left of midline with accompanying osteophytes. Slight hypertrophy of the ligamentum flavum and facet joints create mild spinal stenosis and slight compression of the left lateral recess visible on image 19 of series 8. L3-4: There is a small broad-based disc protrusion asymmetric to the right but also extending into the left lateral recess and left neural foramen. There is a prominent overlying epidural hematoma best seen on image 23 of series 9 which severely compresses the thecal sac. Slight hypertrophy of the ligamentum flavum contributes to the compression of the thecal sac. L4-5: 7 mm spondylolisthesis due to severe bilateral facet arthritis. Combine with hypertrophy of the ligamentum flavum this creates moderately severe spinal stenosis and narrowing of both lateral recesses.  Moderate right foraminal stenosis. L5-S1: Chronic disc space narrowing. Small broad-based disc bulge with accompanying osteophytes without neural impingement. No foraminal stenosis. No facet arthritis. IMPRESSION: 1. Severe compression of the thecal sac at L3-4 due to a combination of a broad-based  disc protrusion, epidural hematoma anterior to the thecal sac likely due to the disc protrusion and hypertrophy of the ligamentum flavum. 2. Moderately severe spinal stenosis at L4-5 due to a combination of hypertrophy of the ligamentum flavum and facet joints and a 7 mm spondylolisthesis. 3. Mild spinal stenosis at L2-3 with slight compression of the left lateral recess. Electronically Signed   By: Lorriane Shire M.D.   On: 06/20/2019 16:58   DG C-Arm 1-60 Min  Result Date: 06/25/2019 CLINICAL DATA:  Cervical fusion and lumbar laminectomy EXAM: DG C-ARM 1-60 MIN; LUMBAR SPINE - 2-3 VIEW COMPARISON:  06/20/2019 FLUOROSCOPY TIME:  Fluoroscopy Time:  6 seconds Radiation Exposure Index (if provided by the fluoroscopic device): 4.15 mGy Number of Acquired Spot Images: 1 FINDINGS: Mild anterolisthesis is again noted of L4 on L5. surgical instruments are noted just posterior to the L3-4 interspace. IMPRESSION: Intraoperative localization at L3-4. Electronically Signed   By: Inez Catalina M.D.   On: 06/25/2019 21:16   VAS [place] LOWER EXTREMITY VENOUS (DVT)  Result Date: 06/30/2019  Lower Venous DVT Study Indications: Rehabilitation status post spinal surgery.  Comparison Study: No prior study on file for comparison Performing Technologist: Sharion Dove RVS  Examination Guidelines: A complete evaluation includes B-mode imaging, spectral Doppler, color Doppler, and power Doppler as needed of all accessible portions of each vessel. Bilateral testing is considered an integral part of a complete examination. Limited examinations for reoccurring indications may be performed as noted. The reflux portion of the exam is performed  with the patient in reverse Trendelenburg.  +---------+---------------+---------+-----------+----------+--------------+ RIGHT    CompressibilityPhasicitySpontaneityPropertiesThrombus Aging +---------+---------------+---------+-----------+----------+--------------+ CFV      Full           Yes      Yes                                 +---------+---------------+---------+-----------+----------+--------------+ SFJ      Full                                                        +---------+---------------+---------+-----------+----------+--------------+ FV Prox  Full                                                        +---------+---------------+---------+-----------+----------+--------------+ FV Mid   Full                                                        +---------+---------------+---------+-----------+----------+--------------+ FV DistalFull                                                        +---------+---------------+---------+-----------+----------+--------------+ PFV      Full                                                        +---------+---------------+---------+-----------+----------+--------------+  POP      Full           Yes      Yes                                 +---------+---------------+---------+-----------+----------+--------------+ PTV      Full                                                        +---------+---------------+---------+-----------+----------+--------------+ PERO     Full                                                        +---------+---------------+---------+-----------+----------+--------------+   +---------+---------------+---------+-----------+----------+--------------+ LEFT     CompressibilityPhasicitySpontaneityPropertiesThrombus Aging +---------+---------------+---------+-----------+----------+--------------+ CFV      Full           Yes      Yes                                  +---------+---------------+---------+-----------+----------+--------------+ SFJ      Full                                                        +---------+---------------+---------+-----------+----------+--------------+ FV Prox  Full                                                        +---------+---------------+---------+-----------+----------+--------------+ FV Mid   Full                                                        +---------+---------------+---------+-----------+----------+--------------+ FV DistalFull                                                        +---------+---------------+---------+-----------+----------+--------------+ PFV      Full                                                        +---------+---------------+---------+-----------+----------+--------------+ POP      Full           Yes      Yes                                 +---------+---------------+---------+-----------+----------+--------------+  PTV      Full                                                        +---------+---------------+---------+-----------+----------+--------------+ PERO     Full                                                        +---------+---------------+---------+-----------+----------+--------------+     Summary: BILATERAL: - No evidence of deep vein thrombosis seen in the lower extremities, bilaterally. -   *See table(s) above for measurements and observations. Electronically signed by Ruta Hinds MD on 06/30/2019 at 7:11:25 PM.    Final     Labs:  Basic Metabolic Panel: Recent Labs  Lab 07/08/19 0900  NA 134*  K 3.4*  CL 97*  CO2 29  GLUCOSE 111*  BUN 17  CREATININE 0.59*  CALCIUM 8.2*    CBC: No results for input(s): WBC, NEUTROABS, HGB, HCT, MCV, PLT in the last 168 hours.  CBG: No results for input(s): GLUCAP in the last 168 hours.  Family history.  Mother with diabetes Brother with diabetes.  Denies any  hypertension hyperlipidemia colon cancer or esophageal cancer  Brief HPI:   Alan Riley is a 74 y.o. right-handed non-English-speaking male from [place].  Per chart review lives with spouse independent until approximately 3 weeks prior to admission with noted decreased functional mobility unable to complete ADLs.  Presented 06/25/2019 with gait abnormality neck pain dysesthesias in the upper extremities and weakness in lower extremities times several weeks.  Work-up imaging revealed severe cervical spinal stenosis C4-5 as well as severe lumbar stenosis L3-4 disc herniation.  Underwent decompressive anterior cervical discectomy C4-5 anterior cervical arthrodesis C4-5 with anterior cervical plating as well as decompressive lumbar laminectomy with foraminotomies discectomy 06/25/2019 per Dr. Sherley Bounds.  No brace required.  Decadron protocol indicated.  Hospital course bouts of urinary retention initially with Foley catheter tube.  Patient was admitted for a comprehensive rehab program   Hospital Course: Alan Riley was admitted to rehab 06/28/2019 for inpatient therapies to consist of PT, ST and OT at least three hours five days a week. Past admission physiatrist, therapy team and rehab RN have worked together to provide customized collaborative inpatient rehab.  Pertaining to patient's decompressive anterior cervical discectomy C4-5.  Surgical site healing nicely weaning of steroids he would follow-up neurosurgery.  SCDs for DVT prophylaxis.  Pain managed with use of oxycodone and Robaxin as needed.  His Lipitor was ongoing for hyperlipidemia.  Urinary retention improved Foley catheter tube removed he was on low-dose Flomax.  No dysuria or hematuria.  Bouts of constipation resolved with laxative assistance.   Blood pressures were monitored on TID basis and controlled     Rehab course: During patient's stay in rehab weekly team conferences were held to monitor patient's progress, set goals and discuss  barriers to discharge. At admission, patient required moderate assist for stand pivot transfers minimal assist ambulate 60 feet rolling walker.  Minimal guard upper body bathing moderate assist lower body bathing.  Moderate assist lower body dressing moderate assist toilet transfers  Physical exam.  Blood pressure 134/80 pulse 51 temperature  97.6 respirations 18 oxygen saturation 96% room air Constitutional.  Well-developed well-nourished HEENT Head.  Normocephalic and atraumatic Eyes.  Pupils round and reactive to light no discharge without nystagmus Neck.  Supple nontender no JVD without thyromegaly Cardiac regular rate rhythm without any extra sounds or murmur heard Respiratory effort normal no respiratory distress without wheeze Abdomen.  Soft nontender positive bowel sounds without rebound Musculoskeletal no edema or tenderness in extremities Neurological alert oriented. Motor.  Bilateral upper extremities 5/5 proximal distal Left lower extremity 5/5 proximal distal Right lower extremity hip flexion knee extension 4+/5, ankle dorsiflexion question 0/5 Skin.  Lumbar as well as cervical surgical sites clean dry and intact  He/  has had improvement in activity tolerance, balance, postural control as well as ability to compensate for deficits. He/ has had improvement in functional use RUE/LUE  and RLE/LLE as well as improvement in awareness.  Patient discharged ambulatory level generally supervision but does need some minimal assist for rolling walker management on stairs.  He was able to gather his belongings for activities delivered and homemaking.  Practiced all aspects of functional mobility including bed mobility functional transfers car transfers navigation of ramp and mulch.       Disposition: Discharge to home   Diet: Regular  Special Instructions: No driving smoking or alcohol  Medications at discharge 1.  Tylenol as needed 2.  Lipitor 20 mg nightly 3.  Decadron 1 mg twice  daily until 07/11/2019 then decrease to 1 daily until completed 4.  Colace 100 mg p.o. twice daily 5.  Flomax 0.4 mg daily 6.  Vitamin D 25 mcg daily  30-35 minutes were spent completing discharge summary  Discharge Instructions     Ambulatory referral to Physical Medicine Rehab   Complete by: As directed    Moderate complexity follow-up 1 to 2 weeks radiculopathy      Allergies as of 07/09/2019   No Known Allergies      Medication List     STOP taking these medications    oxyCODONE 5 MG immediate release tablet Commonly known as: Oxy IR/ROXICODONE       TAKE these medications    acetaminophen 325 MG tablet Commonly known as: TYLENOL Take 2 tablets (650 mg total) by mouth every 6 (six) hours as needed. What changed: Another medication with the same name was removed. Continue taking this medication, and follow the directions you see here.   atorvastatin 20 MG tablet Commonly known as: LIPITOR Take 1 tablet (20 mg total) by mouth at bedtime.   dexamethasone 1 MG tablet Commonly known as: DECADRON Take one pil twice a day till 06/17. Then decrease to one daily till gone.   docusate sodium 100 MG capsule Commonly known as: COLACE Take 1 capsule (100 mg total) by mouth 2 (two) times daily.   senna-docusate 8.6-50 MG tablet Commonly known as: Senokot-S Take 1 tablet by mouth at bedtime.   tamsulosin 0.4 MG Caps capsule Commonly known as: FLOMAX Take 1 capsule (0.4 mg total) by mouth daily after breakfast.   VITAMIN D PO Take 25 mcg by mouth daily.        Follow-up Information     Jamse Arn, MD Follow up.   Specialty: Physical Medicine and Rehabilitation Why: Office to call for appointment Contact information: [place] [address] [phone]         [name], MD. Call on 07/10/2019.   Specialty: Neurosurgery Why: for appointment Contact information:  [address] [place]  [address] [phone]         Janie Morning, DO. Call on 07/10/2019.   Specialty: Family Medicine Why: for post hospital follow up Contact information: [address] [place]  [address] [phone]                 Signed: Cathlyn Parsons 07/15/2019, 8:06 AM Patient was seen, face-face, and physical exam performed by me on day of discharge, greater than 30 minutes of total time spent.. Please see progress note from day of discharge as well.  Delice Lesch, MD, ABPMR

## 2019-07-17 DIAGNOSIS — D500 Iron deficiency anemia secondary to blood loss (chronic): Secondary | ICD-10-CM | POA: Diagnosis not present

## 2019-07-17 DIAGNOSIS — Z4789 Encounter for other orthopedic aftercare: Secondary | ICD-10-CM | POA: Diagnosis not present

## 2019-07-17 DIAGNOSIS — M4802 Spinal stenosis, cervical region: Secondary | ICD-10-CM | POA: Diagnosis not present

## 2019-07-17 DIAGNOSIS — M5412 Radiculopathy, cervical region: Secondary | ICD-10-CM | POA: Diagnosis not present

## 2019-07-17 DIAGNOSIS — M5416 Radiculopathy, lumbar region: Secondary | ICD-10-CM | POA: Diagnosis not present

## 2019-07-17 DIAGNOSIS — R339 Retention of urine, unspecified: Secondary | ICD-10-CM | POA: Diagnosis not present

## 2019-07-17 DIAGNOSIS — E785 Hyperlipidemia, unspecified: Secondary | ICD-10-CM | POA: Diagnosis not present

## 2019-07-17 DIAGNOSIS — K5903 Drug induced constipation: Secondary | ICD-10-CM | POA: Diagnosis not present

## 2019-07-17 DIAGNOSIS — M48061 Spinal stenosis, lumbar region without neurogenic claudication: Secondary | ICD-10-CM | POA: Diagnosis not present

## 2019-07-18 DIAGNOSIS — M4802 Spinal stenosis, cervical region: Secondary | ICD-10-CM | POA: Diagnosis not present

## 2019-07-18 DIAGNOSIS — M5416 Radiculopathy, lumbar region: Secondary | ICD-10-CM | POA: Diagnosis not present

## 2019-07-18 DIAGNOSIS — R339 Retention of urine, unspecified: Secondary | ICD-10-CM | POA: Diagnosis not present

## 2019-07-18 DIAGNOSIS — M48061 Spinal stenosis, lumbar region without neurogenic claudication: Secondary | ICD-10-CM | POA: Diagnosis not present

## 2019-07-18 DIAGNOSIS — K5903 Drug induced constipation: Secondary | ICD-10-CM | POA: Diagnosis not present

## 2019-07-18 DIAGNOSIS — E785 Hyperlipidemia, unspecified: Secondary | ICD-10-CM | POA: Diagnosis not present

## 2019-07-18 DIAGNOSIS — D500 Iron deficiency anemia secondary to blood loss (chronic): Secondary | ICD-10-CM | POA: Diagnosis not present

## 2019-07-18 DIAGNOSIS — M5412 Radiculopathy, cervical region: Secondary | ICD-10-CM | POA: Diagnosis not present

## 2019-07-18 DIAGNOSIS — Z4789 Encounter for other orthopedic aftercare: Secondary | ICD-10-CM | POA: Diagnosis not present

## 2019-07-19 DIAGNOSIS — M5416 Radiculopathy, lumbar region: Secondary | ICD-10-CM | POA: Diagnosis not present

## 2019-07-19 DIAGNOSIS — Z4789 Encounter for other orthopedic aftercare: Secondary | ICD-10-CM | POA: Diagnosis not present

## 2019-07-19 DIAGNOSIS — D500 Iron deficiency anemia secondary to blood loss (chronic): Secondary | ICD-10-CM | POA: Diagnosis not present

## 2019-07-19 DIAGNOSIS — E785 Hyperlipidemia, unspecified: Secondary | ICD-10-CM | POA: Diagnosis not present

## 2019-07-19 DIAGNOSIS — K5903 Drug induced constipation: Secondary | ICD-10-CM | POA: Diagnosis not present

## 2019-07-19 DIAGNOSIS — M5412 Radiculopathy, cervical region: Secondary | ICD-10-CM | POA: Diagnosis not present

## 2019-07-19 DIAGNOSIS — M4802 Spinal stenosis, cervical region: Secondary | ICD-10-CM | POA: Diagnosis not present

## 2019-07-19 DIAGNOSIS — M48061 Spinal stenosis, lumbar region without neurogenic claudication: Secondary | ICD-10-CM | POA: Diagnosis not present

## 2019-07-19 DIAGNOSIS — R339 Retention of urine, unspecified: Secondary | ICD-10-CM | POA: Diagnosis not present

## 2019-07-22 ENCOUNTER — Other Ambulatory Visit: Payer: Self-pay

## 2019-07-22 ENCOUNTER — Encounter: Payer: Self-pay | Admitting: Physical Medicine & Rehabilitation

## 2019-07-22 ENCOUNTER — Encounter: Payer: Medicare HMO | Attending: Physical Medicine & Rehabilitation | Admitting: Physical Medicine & Rehabilitation

## 2019-07-22 VITALS — BP 122/71 | HR 84 | Temp 97.1°F | Ht 68.0 in | Wt 172.0 lb

## 2019-07-22 DIAGNOSIS — M5416 Radiculopathy, lumbar region: Secondary | ICD-10-CM | POA: Diagnosis not present

## 2019-07-22 DIAGNOSIS — R339 Retention of urine, unspecified: Secondary | ICD-10-CM | POA: Insufficient documentation

## 2019-07-22 DIAGNOSIS — Z9889 Other specified postprocedural states: Secondary | ICD-10-CM | POA: Insufficient documentation

## 2019-07-22 DIAGNOSIS — R269 Unspecified abnormalities of gait and mobility: Secondary | ICD-10-CM | POA: Diagnosis not present

## 2019-07-22 DIAGNOSIS — M5412 Radiculopathy, cervical region: Secondary | ICD-10-CM | POA: Diagnosis not present

## 2019-07-22 DIAGNOSIS — M48061 Spinal stenosis, lumbar region without neurogenic claudication: Secondary | ICD-10-CM | POA: Diagnosis not present

## 2019-07-22 DIAGNOSIS — K5903 Drug induced constipation: Secondary | ICD-10-CM | POA: Diagnosis not present

## 2019-07-22 DIAGNOSIS — M4802 Spinal stenosis, cervical region: Secondary | ICD-10-CM | POA: Diagnosis not present

## 2019-07-22 DIAGNOSIS — Z981 Arthrodesis status: Secondary | ICD-10-CM | POA: Insufficient documentation

## 2019-07-22 DIAGNOSIS — D500 Iron deficiency anemia secondary to blood loss (chronic): Secondary | ICD-10-CM | POA: Diagnosis not present

## 2019-07-22 DIAGNOSIS — E785 Hyperlipidemia, unspecified: Secondary | ICD-10-CM | POA: Diagnosis not present

## 2019-07-22 DIAGNOSIS — Z4789 Encounter for other orthopedic aftercare: Secondary | ICD-10-CM | POA: Diagnosis not present

## 2019-07-22 NOTE — Progress Notes (Signed)
Subjective:    Patient ID: Alan Riley, male    DOB: [birth year], 74 y.o.   MRN: [id number]  HPI Right-handed non-English-speaking male presents for transitional care management after receiving CIR for lumbar cervical stenosis with radiculopathy S/P decompressive anterior cervical discectomy C4-5 with arthrodesis and lumbar laminectomy with foraminotomies L3-4 with discectomy 06/25/2019.    Admit date: 06/28/2019 Discharge date: 07/09/2019  Wife supplements history. Interpretor present. At discharge, he was instructed to Neurosurg, stated he is doing well. He has not followed up with PCP. Only wearing PRAFO at times. He completed steroids.  Pain is controlled.  Denies problems with urination with meds. Bowel movements controlled. Denies falls.  Therapies: 3/week Mobility: Walker at home DME: Bedside commode  Pain Inventory Average Pain 0 Pain Right Now 0 My pain is No pain.  In the last 24 hours, has pain interfered with the following? General activity 0 Relation with others 0 Enjoyment of life 0 What TIME of day is your pain at its worst? No pain. Sleep (in general) Good  Pain is worse with: No pain. Pain improves with: No pain. Relief from Meds: No pain meds.  Mobility walk with assistance use a walker how many minutes can you walk? 10 mins. ability to climb steps?  yes do you drive?  no transfers alone Do you have any goals in this area?  yes  Function employed # of hrs/week 35 hrs week what is your job? [employer] retired I need assistance with the following:  dressing and bathing Do you have any goals in this area?  yes  Neuro/Psych No problems in this area trouble walking  Prior Studies Any changes since last visit?  yes New Patient  Physicians involved in your care New Patient   Family History  Problem Relation Age of Onset  . Diabetes Mother   . Diabetes Brother    Social History   Socioeconomic History  . Marital status: Married     Spouse name: Not on file  . Number of children: Not on file  . Years of education: Not on file  . Highest education level: Not on file  Occupational History  . Not on file  Tobacco Use  . Smoking status: Never Smoker  . Smokeless tobacco: Never Used  Vaping Use  . Vaping Use: Never used  Substance and Sexual Activity  . Alcohol use: Not Currently    Comment: occassional  . Drug use: No  . Sexual activity: Yes    Birth control/protection: None  Other Topics Concern  . Not on file  Social History Narrative  . Not on file   Social Determinants of Health   Financial Resource Strain:   . Difficulty of Paying Living Expenses:   Food Insecurity:   . Worried About Charity fundraiser in the Last Year:   . Arboriculturist in the Last Year:   Transportation Needs:   . Film/video editor (Medical):   Marland Kitchen Lack of Transportation (Non-Medical):   Physical Activity:   . Days of Exercise per Week:   . Minutes of Exercise per Session:   Stress:   . Feeling of Stress :   Social Connections:   . Frequency of Communication with Friends and Family:   . Frequency of Social Gatherings with Friends and Family:   . Attends Religious Services:   . Active Member of Clubs or Organizations:   . Attends Archivist Meetings:   Marland Kitchen Marital Status:  Past Surgical History:  Procedure Laterality Date  . ANTERIOR CERVICAL DECOMP/DISCECTOMY FUSION N/A 06/25/2019   Procedure: C4-5 ACDF;  Surgeon: [name], MD;  Location: [place];  Service: Neurosurgery;  Laterality: N/A;  . BACK SURGERY     over 30 yrs ago   . CHOLECYSTECTOMY  1991  . COLONOSCOPY  2013, 2016   x 2  . HEMICOLECTOMY  01/15/03  . LUMBAR LAMINECTOMY/DECOMPRESSION MICRODISCECTOMY Bilateral 06/25/2019   Procedure: L3-4 LUMBAR LAMINECTOMY/DECOMPRESSION MICRODISCECTOMY;  Surgeon: [name], MD;  Location: [place];  Service: Neurosurgery;  Laterality: Bilateral;   Past Medical History:  Diagnosis Date  . Basal cell  carcinoma of skin 2019   scalp  . HLD (hyperlipidemia)   . Hx of adenomatous colonic polyps   . Iron deficiency anemia secondary to blood loss (chronic)   . Malignant neoplasm of ascending colon ([place]) 2004  . Partial small bowel obstruction ([place]) 12/29/2013  . Uses wheelchair    BP 122/71   Pulse 84   Temp (!) 97.1 F (36.2 C)   Ht 5\' 8"  (1.727 m)   Wt 172 lb (78 kg)   SpO2 97%   BMI 26.15 kg/m   Opioid Risk Score:   Fall Risk Score:  `1  Depression screen PHQ 2/9  Depression screen [facility] 2/9 07/22/2019 12/21/2018 10/24/2018 11/18/2016 02/19/2016 01/12/2016 11/06/2015  Decreased Interest 0 0 0 0 0 0 0  Down, Depressed, Hopeless 0 0 0 0 0 0 0  PHQ - 2 Score 0 0 0 0 0 0 0  Altered sleeping 0 - - - - - -  Tired, decreased energy 3 - - - - - -  Change in appetite 0 - - - - - -  Feeling bad or failure about yourself  0 - - - - - -  Trouble concentrating 0 - - - - - -  Moving slowly or fidgety/restless 1 - - - - - -  Suicidal thoughts 0 - - - - - -  PHQ-9 Score 4 - - - - - -   Review of Systems  Constitutional: Negative.   HENT: Negative.   Eyes: Negative.   Cardiovascular: Negative.   Endocrine: Negative.   Genitourinary: Negative.   Musculoskeletal: Positive for gait problem.  Skin: Negative.   Allergic/Immunologic: Negative.   Neurological: Positive for weakness.  Hematological: Negative.   Psychiatric/Behavioral: Negative.        Objective:   Physical Exam Constitutional: No distress . Vital signs reviewed. HENT: Normocephalic.  Atraumatic. Eyes: EOMI. No discharge. Cardiovascular: No JVD. Respiratory: Normal effort.  No stridor. GI: Non-distended. Skin: Surgical sites healed Psych: Normal mood.  Normal behavior. Musc: No edema in extremities.  No tenderness in extremities. Neurological: Alert Motor: Left lower extremity: 4+/5 proximal to distal Right lower extremity: Hip flexion, knee extension 4+/5, ankle dorsiflexion 3/5    Assessment & Plan:    Right-handed non-English-speaking male presents for transitional care management after receiving CIR for lumbar cervical stenosis with radiculopathy S/P decompressive anterior cervical discectomy C4-5 with arthrodesis and lumbar laminectomy with foraminotomies L3-4 with discectomy 06/25/2019.    1.  Gait disturbance secondary to lumbar cervical stenosis with radiculopathy.S/P decompressive anterior cervical discectomy C4-5 with arthrodesis and lumbar laminectomy with foraminotomies L3-4 with discectomy 06/25/2019.  No brace required  Cont therapies  Cont follow up with Neurosurg  2. Pain Management:   Controlled at present  3.  Urinary retention.    Controlled with meds  Continue Flomax, may trial  weaning and monitor for retention  4. Leukocytosis  Likely secondary to steroids             Recommend follow up with PCP  5. Gait abnormality  Cont AFO  Cont walker for safety  Cont therapies

## 2019-07-23 DIAGNOSIS — M48061 Spinal stenosis, lumbar region without neurogenic claudication: Secondary | ICD-10-CM | POA: Diagnosis not present

## 2019-07-23 DIAGNOSIS — D500 Iron deficiency anemia secondary to blood loss (chronic): Secondary | ICD-10-CM | POA: Diagnosis not present

## 2019-07-23 DIAGNOSIS — K5903 Drug induced constipation: Secondary | ICD-10-CM | POA: Diagnosis not present

## 2019-07-23 DIAGNOSIS — M5416 Radiculopathy, lumbar region: Secondary | ICD-10-CM | POA: Diagnosis not present

## 2019-07-23 DIAGNOSIS — M5412 Radiculopathy, cervical region: Secondary | ICD-10-CM | POA: Diagnosis not present

## 2019-07-23 DIAGNOSIS — M4802 Spinal stenosis, cervical region: Secondary | ICD-10-CM | POA: Diagnosis not present

## 2019-07-23 DIAGNOSIS — R339 Retention of urine, unspecified: Secondary | ICD-10-CM | POA: Diagnosis not present

## 2019-07-23 DIAGNOSIS — E785 Hyperlipidemia, unspecified: Secondary | ICD-10-CM | POA: Diagnosis not present

## 2019-07-23 DIAGNOSIS — Z4789 Encounter for other orthopedic aftercare: Secondary | ICD-10-CM | POA: Diagnosis not present

## 2019-07-25 DIAGNOSIS — M5416 Radiculopathy, lumbar region: Secondary | ICD-10-CM | POA: Diagnosis not present

## 2019-07-25 DIAGNOSIS — M5412 Radiculopathy, cervical region: Secondary | ICD-10-CM | POA: Diagnosis not present

## 2019-07-25 DIAGNOSIS — M48061 Spinal stenosis, lumbar region without neurogenic claudication: Secondary | ICD-10-CM | POA: Diagnosis not present

## 2019-07-25 DIAGNOSIS — R339 Retention of urine, unspecified: Secondary | ICD-10-CM | POA: Diagnosis not present

## 2019-07-25 DIAGNOSIS — E785 Hyperlipidemia, unspecified: Secondary | ICD-10-CM | POA: Diagnosis not present

## 2019-07-25 DIAGNOSIS — K5903 Drug induced constipation: Secondary | ICD-10-CM | POA: Diagnosis not present

## 2019-07-25 DIAGNOSIS — D500 Iron deficiency anemia secondary to blood loss (chronic): Secondary | ICD-10-CM | POA: Diagnosis not present

## 2019-07-25 DIAGNOSIS — M4802 Spinal stenosis, cervical region: Secondary | ICD-10-CM | POA: Diagnosis not present

## 2019-07-25 DIAGNOSIS — Z4789 Encounter for other orthopedic aftercare: Secondary | ICD-10-CM | POA: Diagnosis not present

## 2019-07-30 DIAGNOSIS — D500 Iron deficiency anemia secondary to blood loss (chronic): Secondary | ICD-10-CM | POA: Diagnosis not present

## 2019-07-30 DIAGNOSIS — K5903 Drug induced constipation: Secondary | ICD-10-CM | POA: Diagnosis not present

## 2019-07-30 DIAGNOSIS — R339 Retention of urine, unspecified: Secondary | ICD-10-CM | POA: Diagnosis not present

## 2019-07-30 DIAGNOSIS — M48061 Spinal stenosis, lumbar region without neurogenic claudication: Secondary | ICD-10-CM | POA: Diagnosis not present

## 2019-07-30 DIAGNOSIS — E785 Hyperlipidemia, unspecified: Secondary | ICD-10-CM | POA: Diagnosis not present

## 2019-07-30 DIAGNOSIS — M5416 Radiculopathy, lumbar region: Secondary | ICD-10-CM | POA: Diagnosis not present

## 2019-07-30 DIAGNOSIS — Z4789 Encounter for other orthopedic aftercare: Secondary | ICD-10-CM | POA: Diagnosis not present

## 2019-07-30 DIAGNOSIS — M5412 Radiculopathy, cervical region: Secondary | ICD-10-CM | POA: Diagnosis not present

## 2019-07-30 DIAGNOSIS — M4802 Spinal stenosis, cervical region: Secondary | ICD-10-CM | POA: Diagnosis not present

## 2019-07-31 ENCOUNTER — Other Ambulatory Visit: Payer: Self-pay | Admitting: Physical Medicine and Rehabilitation

## 2019-08-01 DIAGNOSIS — R339 Retention of urine, unspecified: Secondary | ICD-10-CM | POA: Diagnosis not present

## 2019-08-01 DIAGNOSIS — D500 Iron deficiency anemia secondary to blood loss (chronic): Secondary | ICD-10-CM | POA: Diagnosis not present

## 2019-08-01 DIAGNOSIS — Z4789 Encounter for other orthopedic aftercare: Secondary | ICD-10-CM | POA: Diagnosis not present

## 2019-08-01 DIAGNOSIS — M5412 Radiculopathy, cervical region: Secondary | ICD-10-CM | POA: Diagnosis not present

## 2019-08-01 DIAGNOSIS — E785 Hyperlipidemia, unspecified: Secondary | ICD-10-CM | POA: Diagnosis not present

## 2019-08-01 DIAGNOSIS — M4802 Spinal stenosis, cervical region: Secondary | ICD-10-CM | POA: Diagnosis not present

## 2019-08-01 DIAGNOSIS — M48061 Spinal stenosis, lumbar region without neurogenic claudication: Secondary | ICD-10-CM | POA: Diagnosis not present

## 2019-08-01 DIAGNOSIS — M5416 Radiculopathy, lumbar region: Secondary | ICD-10-CM | POA: Diagnosis not present

## 2019-08-01 DIAGNOSIS — K5903 Drug induced constipation: Secondary | ICD-10-CM | POA: Diagnosis not present

## 2019-08-05 DIAGNOSIS — M5416 Radiculopathy, lumbar region: Secondary | ICD-10-CM | POA: Diagnosis not present

## 2019-08-05 DIAGNOSIS — Z4789 Encounter for other orthopedic aftercare: Secondary | ICD-10-CM | POA: Diagnosis not present

## 2019-08-05 DIAGNOSIS — K5903 Drug induced constipation: Secondary | ICD-10-CM | POA: Diagnosis not present

## 2019-08-05 DIAGNOSIS — M4802 Spinal stenosis, cervical region: Secondary | ICD-10-CM | POA: Diagnosis not present

## 2019-08-05 DIAGNOSIS — E785 Hyperlipidemia, unspecified: Secondary | ICD-10-CM | POA: Diagnosis not present

## 2019-08-05 DIAGNOSIS — R339 Retention of urine, unspecified: Secondary | ICD-10-CM | POA: Diagnosis not present

## 2019-08-05 DIAGNOSIS — M48061 Spinal stenosis, lumbar region without neurogenic claudication: Secondary | ICD-10-CM | POA: Diagnosis not present

## 2019-08-05 DIAGNOSIS — M5412 Radiculopathy, cervical region: Secondary | ICD-10-CM | POA: Diagnosis not present

## 2019-08-05 DIAGNOSIS — D500 Iron deficiency anemia secondary to blood loss (chronic): Secondary | ICD-10-CM | POA: Diagnosis not present

## 2019-08-06 DIAGNOSIS — G959 Disease of spinal cord, unspecified: Secondary | ICD-10-CM | POA: Diagnosis not present

## 2019-08-07 DIAGNOSIS — M5410 Radiculopathy, site unspecified: Secondary | ICD-10-CM | POA: Diagnosis not present

## 2019-08-09 DIAGNOSIS — E785 Hyperlipidemia, unspecified: Secondary | ICD-10-CM | POA: Diagnosis not present

## 2019-08-09 DIAGNOSIS — M5412 Radiculopathy, cervical region: Secondary | ICD-10-CM | POA: Diagnosis not present

## 2019-08-09 DIAGNOSIS — M5416 Radiculopathy, lumbar region: Secondary | ICD-10-CM | POA: Diagnosis not present

## 2019-08-09 DIAGNOSIS — R339 Retention of urine, unspecified: Secondary | ICD-10-CM | POA: Diagnosis not present

## 2019-08-09 DIAGNOSIS — Z4789 Encounter for other orthopedic aftercare: Secondary | ICD-10-CM | POA: Diagnosis not present

## 2019-08-09 DIAGNOSIS — D500 Iron deficiency anemia secondary to blood loss (chronic): Secondary | ICD-10-CM | POA: Diagnosis not present

## 2019-08-09 DIAGNOSIS — M48061 Spinal stenosis, lumbar region without neurogenic claudication: Secondary | ICD-10-CM | POA: Diagnosis not present

## 2019-08-09 DIAGNOSIS — K5903 Drug induced constipation: Secondary | ICD-10-CM | POA: Diagnosis not present

## 2019-08-09 DIAGNOSIS — M4802 Spinal stenosis, cervical region: Secondary | ICD-10-CM | POA: Diagnosis not present

## 2019-08-12 DIAGNOSIS — M48061 Spinal stenosis, lumbar region without neurogenic claudication: Secondary | ICD-10-CM | POA: Diagnosis not present

## 2019-08-12 DIAGNOSIS — K5903 Drug induced constipation: Secondary | ICD-10-CM | POA: Diagnosis not present

## 2019-08-12 DIAGNOSIS — R339 Retention of urine, unspecified: Secondary | ICD-10-CM | POA: Diagnosis not present

## 2019-08-12 DIAGNOSIS — M5412 Radiculopathy, cervical region: Secondary | ICD-10-CM | POA: Diagnosis not present

## 2019-08-12 DIAGNOSIS — M4802 Spinal stenosis, cervical region: Secondary | ICD-10-CM | POA: Diagnosis not present

## 2019-08-12 DIAGNOSIS — Z4789 Encounter for other orthopedic aftercare: Secondary | ICD-10-CM | POA: Diagnosis not present

## 2019-08-12 DIAGNOSIS — M5416 Radiculopathy, lumbar region: Secondary | ICD-10-CM | POA: Diagnosis not present

## 2019-08-12 DIAGNOSIS — E785 Hyperlipidemia, unspecified: Secondary | ICD-10-CM | POA: Diagnosis not present

## 2019-08-12 DIAGNOSIS — D500 Iron deficiency anemia secondary to blood loss (chronic): Secondary | ICD-10-CM | POA: Diagnosis not present

## 2019-08-14 DIAGNOSIS — R339 Retention of urine, unspecified: Secondary | ICD-10-CM | POA: Diagnosis not present

## 2019-08-14 DIAGNOSIS — Z4789 Encounter for other orthopedic aftercare: Secondary | ICD-10-CM | POA: Diagnosis not present

## 2019-08-14 DIAGNOSIS — M48061 Spinal stenosis, lumbar region without neurogenic claudication: Secondary | ICD-10-CM | POA: Diagnosis not present

## 2019-08-14 DIAGNOSIS — K5903 Drug induced constipation: Secondary | ICD-10-CM | POA: Diagnosis not present

## 2019-08-14 DIAGNOSIS — M5412 Radiculopathy, cervical region: Secondary | ICD-10-CM | POA: Diagnosis not present

## 2019-08-14 DIAGNOSIS — E785 Hyperlipidemia, unspecified: Secondary | ICD-10-CM | POA: Diagnosis not present

## 2019-08-14 DIAGNOSIS — D500 Iron deficiency anemia secondary to blood loss (chronic): Secondary | ICD-10-CM | POA: Diagnosis not present

## 2019-08-14 DIAGNOSIS — M5416 Radiculopathy, lumbar region: Secondary | ICD-10-CM | POA: Diagnosis not present

## 2019-08-14 DIAGNOSIS — M4802 Spinal stenosis, cervical region: Secondary | ICD-10-CM | POA: Diagnosis not present

## 2019-08-20 DIAGNOSIS — E7800 Pure hypercholesterolemia, unspecified: Secondary | ICD-10-CM | POA: Diagnosis not present

## 2019-08-23 DIAGNOSIS — Z4789 Encounter for other orthopedic aftercare: Secondary | ICD-10-CM | POA: Diagnosis not present

## 2019-08-23 DIAGNOSIS — K5903 Drug induced constipation: Secondary | ICD-10-CM | POA: Diagnosis not present

## 2019-08-23 DIAGNOSIS — M4802 Spinal stenosis, cervical region: Secondary | ICD-10-CM | POA: Diagnosis not present

## 2019-08-23 DIAGNOSIS — R339 Retention of urine, unspecified: Secondary | ICD-10-CM | POA: Diagnosis not present

## 2019-08-23 DIAGNOSIS — M5416 Radiculopathy, lumbar region: Secondary | ICD-10-CM | POA: Diagnosis not present

## 2019-08-23 DIAGNOSIS — E785 Hyperlipidemia, unspecified: Secondary | ICD-10-CM | POA: Diagnosis not present

## 2019-08-23 DIAGNOSIS — M48061 Spinal stenosis, lumbar region without neurogenic claudication: Secondary | ICD-10-CM | POA: Diagnosis not present

## 2019-08-23 DIAGNOSIS — D500 Iron deficiency anemia secondary to blood loss (chronic): Secondary | ICD-10-CM | POA: Diagnosis not present

## 2019-08-23 DIAGNOSIS — M5412 Radiculopathy, cervical region: Secondary | ICD-10-CM | POA: Diagnosis not present

## 2019-08-27 DIAGNOSIS — E7800 Pure hypercholesterolemia, unspecified: Secondary | ICD-10-CM | POA: Diagnosis not present

## 2019-08-27 DIAGNOSIS — Z09 Encounter for follow-up examination after completed treatment for conditions other than malignant neoplasm: Secondary | ICD-10-CM | POA: Diagnosis not present

## 2019-08-27 DIAGNOSIS — R634 Abnormal weight loss: Secondary | ICD-10-CM | POA: Diagnosis not present

## 2019-08-27 DIAGNOSIS — Z0000 Encounter for general adult medical examination without abnormal findings: Secondary | ICD-10-CM | POA: Diagnosis not present

## 2019-08-27 DIAGNOSIS — M5136 Other intervertebral disc degeneration, lumbar region: Secondary | ICD-10-CM | POA: Diagnosis not present

## 2019-08-27 DIAGNOSIS — R7309 Other abnormal glucose: Secondary | ICD-10-CM | POA: Diagnosis not present

## 2019-08-27 DIAGNOSIS — M5440 Lumbago with sciatica, unspecified side: Secondary | ICD-10-CM | POA: Diagnosis not present

## 2019-08-27 DIAGNOSIS — M47812 Spondylosis without myelopathy or radiculopathy, cervical region: Secondary | ICD-10-CM | POA: Diagnosis not present

## 2019-09-02 ENCOUNTER — Encounter: Payer: Medicare HMO | Attending: Physical Medicine & Rehabilitation | Admitting: Physical Medicine & Rehabilitation

## 2019-09-02 ENCOUNTER — Other Ambulatory Visit: Payer: Self-pay

## 2019-09-02 ENCOUNTER — Encounter: Payer: Self-pay | Admitting: Physical Medicine & Rehabilitation

## 2019-09-02 VITALS — BP 127/73 | HR 74 | Temp 98.7°F | Ht 68.0 in | Wt 164.0 lb

## 2019-09-02 DIAGNOSIS — Z9889 Other specified postprocedural states: Secondary | ICD-10-CM | POA: Diagnosis not present

## 2019-09-02 DIAGNOSIS — R339 Retention of urine, unspecified: Secondary | ICD-10-CM

## 2019-09-02 DIAGNOSIS — Z981 Arthrodesis status: Secondary | ICD-10-CM

## 2019-09-02 DIAGNOSIS — R269 Unspecified abnormalities of gait and mobility: Secondary | ICD-10-CM | POA: Diagnosis not present

## 2019-09-02 DIAGNOSIS — M5416 Radiculopathy, lumbar region: Secondary | ICD-10-CM

## 2019-09-02 NOTE — Progress Notes (Signed)
Subjective:    Patient ID: Alan Riley, male    DOB: 03/16/1945, 74 y.o.   MRN: [id number]  HPI Right-handed non-English-speaking male presents for follow up for lumbar cervical stenosis with radiculopathy S/P decompressive anterior cervical discectomy C4-5 with arthrodesis and lumbar laminectomy with foraminotomies L3-4 with discectomy 06/25/2019.    Last clinic visit 07/22/19. Video interpretor present. Since that time, he completed therapies.  He was released by Neurosurg. Pain is controlled. He was able to wean Flomax.  He followed up with PCP regarding lab work and results "are great". Denies falls.   Pain Inventory Average Pain 0 Pain Right Now 0 My pain is No pain.  In the last 24 hours, has pain interfered with the following? General activity 0 Relation with others 0 Enjoyment of life 0 What TIME of day is your pain at its worst? No pain. Sleep (in general) Good  Pain is worse with: No pain. Pain improves with: No pain. Relief from Meds: No pain meds.  Mobility walk with assistance use a walker how many minutes can you walk? 10 ability to climb steps?  no do you drive?  no transfers alone Do you have any goals in this area?  yes  Function employed # of hrs/week 35 hrs week what is your job? [employer] retired I need assistance with the following:  dressing, bathing, toileting, meal prep, household duties and shopping Do you have any goals in this area?  yes  Neuro/Psych bladder control problems bowel control problems weakness trouble walking  Prior Studies Any changes since last visit?  no  Physicians involved in your care Any changes since last visit?  no   Family History  Problem Relation Age of Onset  . Diabetes Mother   . Diabetes Brother    Social History   Socioeconomic History  . Marital status: Married    Spouse name: Not on file  . Number of children: Not on file  . Years of education: Not on file  . Highest education level:  Not on file  Occupational History  . Not on file  Tobacco Use  . Smoking status: Never Smoker  . Smokeless tobacco: Never Used  Vaping Use  . Vaping Use: Never used  Substance and Sexual Activity  . Alcohol use: Not Currently    Comment: occassional  . Drug use: No  . Sexual activity: Yes    Birth control/protection: None  Other Topics Concern  . Not on file  Social History Narrative  . Not on file   Social Determinants of Health   Financial Resource Strain:   . Difficulty of Paying Living Expenses:   Food Insecurity:   . Worried About Charity fundraiser in the Last Year:   . Arboriculturist in the Last Year:   Transportation Needs:   . Film/video editor (Medical):   Marland Kitchen Lack of Transportation (Non-Medical):   Physical Activity:   . Days of Exercise per Week:   . Minutes of Exercise per Session:   Stress:   . Feeling of Stress :   Social Connections:   . Frequency of Communication with Friends and Family:   . Frequency of Social Gatherings with Friends and Family:   . Attends Religious Services:   . Active Member of Clubs or Organizations:   . Attends Archivist Meetings:   Marland Kitchen Marital Status:    Past Surgical History:  Procedure Laterality Date  . ANTERIOR CERVICAL DECOMP/DISCECTOMY FUSION N/A 06/25/2019  Procedure: C4-5 ACDF;  Surgeon: [name], MD;  Location: [place];  Service: Neurosurgery;  Laterality: N/A;  . BACK SURGERY     over 30 yrs ago   . CHOLECYSTECTOMY  1991  . COLONOSCOPY  2013, 2016   x 2  . HEMICOLECTOMY  01/15/03  . LUMBAR LAMINECTOMY/DECOMPRESSION MICRODISCECTOMY Bilateral 06/25/2019   Procedure: L3-4 LUMBAR LAMINECTOMY/DECOMPRESSION MICRODISCECTOMY;  Surgeon: [name], MD;  Location: [place];  Service: Neurosurgery;  Laterality: Bilateral;   Past Medical History:  Diagnosis Date  . Basal cell carcinoma of skin 2019   scalp  . HLD (hyperlipidemia)   . Hx of adenomatous colonic polyps   . Iron deficiency anemia secondary  to blood loss (chronic)   . Malignant neoplasm of ascending colon ([place]) 2004  . Partial small bowel obstruction ([place]) 12/29/2013  . Uses wheelchair    There were no vitals taken for this visit.  Opioid Risk Score:   Fall Risk Score:  `1  Depression screen PHQ 2/9  Depression screen [facility] 2/9 07/22/2019 12/21/2018 10/24/2018 11/18/2016 02/19/2016 01/12/2016 11/06/2015  Decreased Interest 0 0 0 0 0 0 0  Down, Depressed, Hopeless 0 0 0 0 0 0 0  PHQ - 2 Score 0 0 0 0 0 0 0  Altered sleeping 0 - - - - - -  Tired, decreased energy 3 - - - - - -  Change in appetite 0 - - - - - -  Feeling bad or failure about yourself  0 - - - - - -  Trouble concentrating 0 - - - - - -  Moving slowly or fidgety/restless 1 - - - - - -  Suicidal thoughts 0 - - - - - -  PHQ-9 Score 4 - - - - - -   Review of Systems  Constitutional: Negative.   HENT: Negative.   Eyes: Negative.   Cardiovascular: Negative.   Gastrointestinal: Negative.   Endocrine: Negative.   Genitourinary: Negative.   Musculoskeletal: Positive for gait problem.  Skin: Negative.   Allergic/Immunologic: Negative.   Neurological: Positive for weakness and numbness.  Hematological: Negative.   Psychiatric/Behavioral: Negative.       Objective:   Physical Exam Constitutional: NAD. Vital signs reviewed. HENT: Normocephalic.  Atraumatic. Eyes: EOMI. No discharge. Cardiovascular: No JVD. Respiratory: Normal effort.  No stridor. GI: Non-distended. Skin: Surgical sites healed Psych: Normal mood.  Normal behavior. Musc: No edema in extremities.  No tenderness in extremities. Neurological: Alert. Motor: Left lower extremity: 4+/5 proximal to distal Right lower extremity: Hip flexion, knee extension 4+/5, ankle dorsiflexion 3/5    Assessment & Plan:  Right-handed non-English-speaking male presents for lumbar cervical stenosis with radiculopathy S/P decompressive anterior cervical discectomy C4-5 with arthrodesis and lumbar laminectomy with  foraminotomies L3-4 with discectomy 06/25/2019.    1.  Gait disturbance secondary to lumbar cervical stenosis with radiculopathy.S/P decompressive anterior cervical discectomy C4-5 with arthrodesis and lumbar laminectomy with foraminotomies L3-4 with discectomy 06/25/2019.  No brace required  Cont HEP, completed HH therapies, does not want outpatient therapies at present  Release by Neurosurg  2.  Urinary retention: Resolved  3. Leukocytosis: Resolved per patient  4. Gait abnormality  Continue AFO  Continue walker for safety  Continue HEP  Patient would like to wait 3 months and reevaluate need for outpatient therapies  > 30 minutes spent with patient and wife and interpretor with discussions regarding mobility, gait, orthosis, therapies, etc.

## 2019-09-07 DIAGNOSIS — M5410 Radiculopathy, site unspecified: Secondary | ICD-10-CM | POA: Diagnosis not present

## 2019-10-08 DIAGNOSIS — M5410 Radiculopathy, site unspecified: Secondary | ICD-10-CM | POA: Diagnosis not present

## 2019-11-07 DIAGNOSIS — M5410 Radiculopathy, site unspecified: Secondary | ICD-10-CM | POA: Diagnosis not present

## 2019-11-07 DIAGNOSIS — Z23 Encounter for immunization: Secondary | ICD-10-CM | POA: Diagnosis not present

## 2019-12-03 ENCOUNTER — Other Ambulatory Visit: Payer: Self-pay

## 2019-12-03 ENCOUNTER — Encounter: Payer: Self-pay | Admitting: Physical Medicine & Rehabilitation

## 2019-12-03 ENCOUNTER — Encounter: Payer: Medicare HMO | Attending: Physical Medicine & Rehabilitation | Admitting: Physical Medicine & Rehabilitation

## 2019-12-03 VITALS — BP 131/67 | HR 74 | Temp 98.6°F | Ht 68.0 in | Wt 173.2 lb

## 2019-12-03 DIAGNOSIS — R269 Unspecified abnormalities of gait and mobility: Secondary | ICD-10-CM

## 2019-12-03 DIAGNOSIS — Z981 Arthrodesis status: Secondary | ICD-10-CM

## 2019-12-03 DIAGNOSIS — R600 Localized edema: Secondary | ICD-10-CM | POA: Diagnosis not present

## 2019-12-03 DIAGNOSIS — M5416 Radiculopathy, lumbar region: Secondary | ICD-10-CM

## 2019-12-03 NOTE — Progress Notes (Signed)
Subjective:    Patient ID: Alan Riley, male    DOB: 1945-02-22, 74 y.o.   MRN: [id number]  HPI Right-handed non-English-speaking male presents for follow up for lumbar cervical stenosis with radiculopathy S/P decompressive anterior cervical discectomy C4-5 with arthrodesis and lumbar laminectomy with foraminotomies L3-4 with discectomy 06/25/2019.    Last clinic visit 09/02/2019.  Interpretor present. Since that time, patient states he is doing HEP. Denies falls. He is using a cane and AFO for ambulation. He states he does not feel he needs outpatient therapies.  Pain Inventory Average Pain 0 Pain Right Now 0 My pain is No pain.  In the last 24 hours, has pain interfered with the following? General activity 0 Relation with others 0 Enjoyment of life 0 What TIME of day is your pain at its worst? No pain. Sleep (in general) Good  Pain is worse with: No pain. Pain improves with: No pain. Relief from Meds: No pain meds.     Family History  Problem Relation Age of Onset  . Diabetes Mother   . Diabetes Brother    Social History   Socioeconomic History  . Marital status: Married    Spouse name: Not on file  . Number of children: Not on file  . Years of education: Not on file  . Highest education level: Not on file  Occupational History  . Not on file  Tobacco Use  . Smoking status: Never Smoker  . Smokeless tobacco: Never Used  Vaping Use  . Vaping Use: Never used  Substance and Sexual Activity  . Alcohol use: Not Currently    Comment: occassional  . Drug use: No  . Sexual activity: Yes    Birth control/protection: None  Other Topics Concern  . Not on file  Social History Narrative  . Not on file   Social Determinants of Health   Financial Resource Strain:   . Difficulty of Paying Living Expenses: Not on file  Food Insecurity:   . Worried About Charity fundraiser in the Last Year: Not on file  . Ran Out of Food in the Last Year: Not on file    Transportation Needs:   . Lack of Transportation (Medical): Not on file  . Lack of Transportation (Non-Medical): Not on file  Physical Activity:   . Days of Exercise per Week: Not on file  . Minutes of Exercise per Session: Not on file  Stress:   . Feeling of Stress : Not on file  Social Connections:   . Frequency of Communication with Friends and Family: Not on file  . Frequency of Social Gatherings with Friends and Family: Not on file  . Attends Religious Services: Not on file  . Active Member of Clubs or Organizations: Not on file  . Attends Archivist Meetings: Not on file  . Marital Status: Not on file   Past Surgical History:  Procedure Laterality Date  . ANTERIOR CERVICAL DECOMP/DISCECTOMY FUSION N/A 06/25/2019   Procedure: C4-5 ACDF;  Surgeon: [name], MD;  Location: [place];  Service: Neurosurgery;  Laterality: N/A;  . BACK SURGERY     over 30 yrs ago   . CHOLECYSTECTOMY  1991  . COLONOSCOPY  2013, 2016   x 2  . HEMICOLECTOMY  01/15/03  . LUMBAR LAMINECTOMY/DECOMPRESSION MICRODISCECTOMY Bilateral 06/25/2019   Procedure: L3-4 LUMBAR LAMINECTOMY/DECOMPRESSION MICRODISCECTOMY;  Surgeon: [name], MD;  Location: [place];  Service: Neurosurgery;  Laterality: Bilateral;   Past Medical History:  Diagnosis Date  . Basal cell carcinoma of skin 2019   scalp  . HLD (hyperlipidemia)   . Hx of adenomatous colonic polyps   . Iron deficiency anemia secondary to blood loss (chronic)   . Malignant neoplasm of ascending colon ([place]) 2004  . Partial small bowel obstruction ([place]) 12/29/2013  . Uses wheelchair    BP 131/67   Pulse 74   Temp 98.6 F (37 C)   Ht 5\' 8"  (1.727 m)   Wt 173 lb 3.2 oz (78.6 kg)   SpO2 94%   BMI 26.33 kg/m   Opioid Risk Score:   Fall Risk Score:  `1  Depression screen PHQ 2/9  Depression screen [facility] 2/9 12/03/2019 07/22/2019 12/21/2018 10/24/2018 11/18/2016 02/19/2016 01/12/2016  Decreased Interest 0 0 0 0 0 0 0  Down, Depressed,  Hopeless 0 0 0 0 0 0 0  PHQ - 2 Score 0 0 0 0 0 0 0  Altered sleeping - 0 - - - - -  Tired, decreased energy - 3 - - - - -  Change in appetite - 0 - - - - -  Feeling bad or failure about yourself  - 0 - - - - -  Trouble concentrating - 0 - - - - -  Moving slowly or fidgety/restless - 1 - - - - -  Suicidal thoughts - 0 - - - - -  PHQ-9 Score - 4 - - - - -   Review of Systems  Constitutional: Negative.   HENT: Negative.   Eyes: Negative.   Cardiovascular: Negative.   Gastrointestinal: Negative.   Endocrine: Negative.   Genitourinary: Negative.   Musculoskeletal: Positive for gait problem.  Skin: Negative.   Allergic/Immunologic: Negative.   Neurological: Positive for weakness and numbness.  Hematological: Negative.   Psychiatric/Behavioral: Negative.   All other systems reviewed and are negative.     Objective:   Physical Exam  Constitutional: No distress . Vital signs reviewed. HENT: Normocephalic.  Atraumatic. Eyes: EOMI. No discharge. Cardiovascular: No JVD.   Respiratory: Normal effort.  No stridor.   GI: Non-distended.   Skin: Warm and dry.  Intact. Psych: Normal mood.  Normal behavior. Musc: RLE edema.  No tenderness in extremities. Gait: Mild LLE limp Neurological: Alert Motor: Left lower extremity: 4+/5 proximal to distal Right lower extremity: Hip flexion, knee extension 4+/5, ankle dorsiflexion 4-/5    Assessment & Plan:  Right-handed non-English-speaking male presents for lumbar cervical stenosis with radiculopathy S/P decompressive anterior cervical discectomy C4-5 with arthrodesis and lumbar laminectomy with foraminotomies L3-4 with discectomy 06/25/2019.    1.  Gait disturbance secondary to lumbar cervical stenosis with radiculopathy.S/P decompressive anterior cervical discectomy C4-5 with arthrodesis and lumbar laminectomy with foraminotomies L3-4 with discectomy 06/25/2019.  No brace required  Continue HEP, doing exercises for several hours/day - does not  feel need for outpatient therapies  Released by Neurosurg  2. Gait abnormality  Continue AFO, less reliant  Now using cane for safety  Continue HEP  3. RLE edema  Discussed decrease Na+ intake and elevation of extremity  Improving

## 2019-12-08 DIAGNOSIS — M5410 Radiculopathy, site unspecified: Secondary | ICD-10-CM | POA: Diagnosis not present

## 2019-12-18 DIAGNOSIS — Z125 Encounter for screening for malignant neoplasm of prostate: Secondary | ICD-10-CM | POA: Diagnosis not present

## 2019-12-18 DIAGNOSIS — R7309 Other abnormal glucose: Secondary | ICD-10-CM | POA: Diagnosis not present

## 2019-12-18 DIAGNOSIS — R634 Abnormal weight loss: Secondary | ICD-10-CM | POA: Diagnosis not present

## 2019-12-18 DIAGNOSIS — E7800 Pure hypercholesterolemia, unspecified: Secondary | ICD-10-CM | POA: Diagnosis not present

## 2019-12-27 DIAGNOSIS — R269 Unspecified abnormalities of gait and mobility: Secondary | ICD-10-CM | POA: Diagnosis not present

## 2019-12-27 DIAGNOSIS — Z0000 Encounter for general adult medical examination without abnormal findings: Secondary | ICD-10-CM | POA: Diagnosis not present

## 2019-12-27 DIAGNOSIS — M47812 Spondylosis without myelopathy or radiculopathy, cervical region: Secondary | ICD-10-CM | POA: Diagnosis not present

## 2019-12-27 DIAGNOSIS — R29898 Other symptoms and signs involving the musculoskeletal system: Secondary | ICD-10-CM | POA: Diagnosis not present

## 2019-12-27 DIAGNOSIS — M6281 Muscle weakness (generalized): Secondary | ICD-10-CM | POA: Diagnosis not present

## 2019-12-27 DIAGNOSIS — R946 Abnormal results of thyroid function studies: Secondary | ICD-10-CM | POA: Diagnosis not present

## 2019-12-27 DIAGNOSIS — E7800 Pure hypercholesterolemia, unspecified: Secondary | ICD-10-CM | POA: Diagnosis not present

## 2019-12-27 DIAGNOSIS — Z85038 Personal history of other malignant neoplasm of large intestine: Secondary | ICD-10-CM | POA: Diagnosis not present

## 2020-01-07 DIAGNOSIS — M5410 Radiculopathy, site unspecified: Secondary | ICD-10-CM | POA: Diagnosis not present

## 2020-02-07 DIAGNOSIS — M5410 Radiculopathy, site unspecified: Secondary | ICD-10-CM | POA: Diagnosis not present

## 2020-03-09 DIAGNOSIS — M5410 Radiculopathy, site unspecified: Secondary | ICD-10-CM | POA: Diagnosis not present

## 2020-04-06 DIAGNOSIS — M5410 Radiculopathy, site unspecified: Secondary | ICD-10-CM | POA: Diagnosis not present

## 2020-05-07 DIAGNOSIS — M5410 Radiculopathy, site unspecified: Secondary | ICD-10-CM | POA: Diagnosis not present

## 2020-06-06 DIAGNOSIS — M5410 Radiculopathy, site unspecified: Secondary | ICD-10-CM | POA: Diagnosis not present

## 2020-07-07 DIAGNOSIS — M5410 Radiculopathy, site unspecified: Secondary | ICD-10-CM | POA: Diagnosis not present

## 2020-10-01 DIAGNOSIS — B029 Zoster without complications: Secondary | ICD-10-CM | POA: Diagnosis not present

## 2020-10-28 DIAGNOSIS — Z8619 Personal history of other infectious and parasitic diseases: Secondary | ICD-10-CM | POA: Diagnosis not present

## 2020-10-28 DIAGNOSIS — Z79899 Other long term (current) drug therapy: Secondary | ICD-10-CM | POA: Diagnosis not present

## 2020-10-28 DIAGNOSIS — R946 Abnormal results of thyroid function studies: Secondary | ICD-10-CM | POA: Diagnosis not present

## 2020-10-28 DIAGNOSIS — E559 Vitamin D deficiency, unspecified: Secondary | ICD-10-CM | POA: Diagnosis not present

## 2020-10-28 DIAGNOSIS — E7800 Pure hypercholesterolemia, unspecified: Secondary | ICD-10-CM | POA: Diagnosis not present

## 2020-10-28 DIAGNOSIS — R7309 Other abnormal glucose: Secondary | ICD-10-CM | POA: Diagnosis not present

## 2020-10-28 DIAGNOSIS — Z125 Encounter for screening for malignant neoplasm of prostate: Secondary | ICD-10-CM | POA: Diagnosis not present

## 2020-12-21 DIAGNOSIS — Z125 Encounter for screening for malignant neoplasm of prostate: Secondary | ICD-10-CM | POA: Diagnosis not present

## 2020-12-21 DIAGNOSIS — R946 Abnormal results of thyroid function studies: Secondary | ICD-10-CM | POA: Diagnosis not present

## 2020-12-21 DIAGNOSIS — R7309 Other abnormal glucose: Secondary | ICD-10-CM | POA: Diagnosis not present

## 2020-12-21 DIAGNOSIS — Z79899 Other long term (current) drug therapy: Secondary | ICD-10-CM | POA: Diagnosis not present

## 2020-12-21 DIAGNOSIS — E7800 Pure hypercholesterolemia, unspecified: Secondary | ICD-10-CM | POA: Diagnosis not present

## 2020-12-21 DIAGNOSIS — E559 Vitamin D deficiency, unspecified: Secondary | ICD-10-CM | POA: Diagnosis not present

## 2020-12-23 DIAGNOSIS — H52229 Regular astigmatism, unspecified eye: Secondary | ICD-10-CM | POA: Diagnosis not present

## 2020-12-23 DIAGNOSIS — Z0100 Encounter for examination of eyes and vision without abnormal findings: Secondary | ICD-10-CM | POA: Diagnosis not present

## 2020-12-28 DIAGNOSIS — Z0000 Encounter for general adult medical examination without abnormal findings: Secondary | ICD-10-CM | POA: Diagnosis not present

## 2020-12-28 DIAGNOSIS — E559 Vitamin D deficiency, unspecified: Secondary | ICD-10-CM | POA: Diagnosis not present

## 2020-12-28 DIAGNOSIS — E7800 Pure hypercholesterolemia, unspecified: Secondary | ICD-10-CM | POA: Diagnosis not present

## 2020-12-28 DIAGNOSIS — M5440 Lumbago with sciatica, unspecified side: Secondary | ICD-10-CM | POA: Diagnosis not present

## 2020-12-28 DIAGNOSIS — Z125 Encounter for screening for malignant neoplasm of prostate: Secondary | ICD-10-CM | POA: Diagnosis not present

## 2020-12-28 DIAGNOSIS — R7309 Other abnormal glucose: Secondary | ICD-10-CM | POA: Diagnosis not present

## 2020-12-28 DIAGNOSIS — Z23 Encounter for immunization: Secondary | ICD-10-CM | POA: Diagnosis not present

## 2020-12-28 DIAGNOSIS — M6281 Muscle weakness (generalized): Secondary | ICD-10-CM | POA: Diagnosis not present

## 2020-12-28 DIAGNOSIS — R946 Abnormal results of thyroid function studies: Secondary | ICD-10-CM | POA: Diagnosis not present

## 2021-11-16 DIAGNOSIS — Z23 Encounter for immunization: Secondary | ICD-10-CM | POA: Diagnosis not present

## 2021-12-13 DIAGNOSIS — H5203 Hypermetropia, bilateral: Secondary | ICD-10-CM | POA: Diagnosis not present

## 2021-12-13 DIAGNOSIS — H2513 Age-related nuclear cataract, bilateral: Secondary | ICD-10-CM | POA: Diagnosis not present

## 2021-12-13 DIAGNOSIS — H52223 Regular astigmatism, bilateral: Secondary | ICD-10-CM | POA: Diagnosis not present

## 2021-12-13 DIAGNOSIS — Z135 Encounter for screening for eye and ear disorders: Secondary | ICD-10-CM | POA: Diagnosis not present

## 2021-12-13 DIAGNOSIS — H524 Presbyopia: Secondary | ICD-10-CM | POA: Diagnosis not present

## 2021-12-28 DIAGNOSIS — Z125 Encounter for screening for malignant neoplasm of prostate: Secondary | ICD-10-CM | POA: Diagnosis not present

## 2021-12-28 DIAGNOSIS — E559 Vitamin D deficiency, unspecified: Secondary | ICD-10-CM | POA: Diagnosis not present

## 2021-12-28 DIAGNOSIS — E7800 Pure hypercholesterolemia, unspecified: Secondary | ICD-10-CM | POA: Diagnosis not present

## 2021-12-28 DIAGNOSIS — Z0000 Encounter for general adult medical examination without abnormal findings: Secondary | ICD-10-CM | POA: Diagnosis not present

## 2021-12-28 DIAGNOSIS — R946 Abnormal results of thyroid function studies: Secondary | ICD-10-CM | POA: Diagnosis not present

## 2022-01-04 DIAGNOSIS — E559 Vitamin D deficiency, unspecified: Secondary | ICD-10-CM | POA: Diagnosis not present

## 2022-01-04 DIAGNOSIS — Z85038 Personal history of other malignant neoplasm of large intestine: Secondary | ICD-10-CM | POA: Diagnosis not present

## 2022-01-04 DIAGNOSIS — E7800 Pure hypercholesterolemia, unspecified: Secondary | ICD-10-CM | POA: Diagnosis not present

## 2022-01-04 DIAGNOSIS — Z0000 Encounter for general adult medical examination without abnormal findings: Secondary | ICD-10-CM | POA: Diagnosis not present

## 2022-01-04 DIAGNOSIS — Z79899 Other long term (current) drug therapy: Secondary | ICD-10-CM | POA: Diagnosis not present

## 2022-01-04 DIAGNOSIS — Z125 Encounter for screening for malignant neoplasm of prostate: Secondary | ICD-10-CM | POA: Diagnosis not present

## 2022-01-04 DIAGNOSIS — R946 Abnormal results of thyroid function studies: Secondary | ICD-10-CM | POA: Diagnosis not present

## 2022-01-04 DIAGNOSIS — R7309 Other abnormal glucose: Secondary | ICD-10-CM | POA: Diagnosis not present

## 2022-05-19 ENCOUNTER — Encounter (HOSPITAL_COMMUNITY): Payer: Self-pay

## 2022-05-19 ENCOUNTER — Other Ambulatory Visit: Payer: Self-pay

## 2022-05-19 ENCOUNTER — Emergency Department (HOSPITAL_COMMUNITY): Payer: Medicare HMO

## 2022-05-19 ENCOUNTER — Emergency Department (HOSPITAL_COMMUNITY)
Admission: EM | Admit: 2022-05-19 | Discharge: 2022-05-20 | Disposition: A | Discharge status: Home / Self Care | Payer: Medicare HMO | Attending: Emergency Medicine | Admitting: Emergency Medicine

## 2022-05-19 DIAGNOSIS — R9431 Abnormal electrocardiogram [ECG] [EKG]: Secondary | ICD-10-CM | POA: Diagnosis not present

## 2022-05-19 DIAGNOSIS — R918 Other nonspecific abnormal finding of lung field: Secondary | ICD-10-CM | POA: Diagnosis not present

## 2022-05-19 DIAGNOSIS — I6601 Occlusion and stenosis of right middle cerebral artery: Secondary | ICD-10-CM | POA: Diagnosis not present

## 2022-05-19 DIAGNOSIS — H532 Diplopia: Secondary | ICD-10-CM | POA: Diagnosis not present

## 2022-05-19 DIAGNOSIS — H02402 Unspecified ptosis of left eyelid: Secondary | ICD-10-CM | POA: Insufficient documentation

## 2022-05-19 DIAGNOSIS — G7000 Myasthenia gravis without (acute) exacerbation: Secondary | ICD-10-CM | POA: Insufficient documentation

## 2022-05-19 DIAGNOSIS — H538 Other visual disturbances: Secondary | ICD-10-CM | POA: Diagnosis present

## 2022-05-19 DIAGNOSIS — H02422 Myogenic ptosis of left eyelid: Secondary | ICD-10-CM | POA: Diagnosis not present

## 2022-05-19 LAB — URINALYSIS, ROUTINE W REFLEX MICROSCOPIC
Bilirubin Urine: NEGATIVE
Glucose, UA: NEGATIVE mg/dL
Hgb urine dipstick: NEGATIVE
Ketones, ur: NEGATIVE mg/dL
Leukocytes,Ua: NEGATIVE
Nitrite: NEGATIVE
Protein, ur: NEGATIVE mg/dL
Specific Gravity, Urine: 1.021 (ref 1.005–1.030)
pH: 5 (ref 5.0–8.0)

## 2022-05-19 LAB — COMPREHENSIVE METABOLIC PANEL
ALT: 20 U/L (ref 0–44)
AST: 21 U/L (ref 15–41)
Albumin: 4 g/dL (ref 3.5–5.0)
Alkaline Phosphatase: 64 U/L (ref 38–126)
Anion gap: 8 (ref 5–15)
BUN: 5 mg/dL — ABNORMAL LOW (ref 8–23)
CO2: 25 mmol/L (ref 22–32)
Calcium: 8.7 mg/dL — ABNORMAL LOW (ref 8.9–10.3)
Chloride: 105 mmol/L (ref 98–111)
Creatinine, Ser: 0.66 mg/dL (ref 0.61–1.24)
GFR, Estimated: >60 mL/min (ref >60)
Glucose, Bld: 107 mg/dL — ABNORMAL HIGH (ref 70–99)
Potassium: 4 mmol/L (ref 3.5–5.1)
Sodium: 138 mmol/L (ref 135–145)
Total Bilirubin: 1 mg/dL (ref 0.3–1.2)
Total Protein: 6.6 g/dL (ref 6.5–8.1)

## 2022-05-19 LAB — DIFFERENTIAL
Abs Immature Granulocytes: 0.03 10*3/uL (ref 0.00–0.07)
Basophils Absolute: 0 10*3/uL (ref 0.0–0.1)
Basophils Relative: 0 %
Eosinophils Absolute: 0 10*3/uL (ref 0.0–0.5)
Eosinophils Relative: 1 %
Immature Granulocytes: 0 %
Lymphocytes Relative: 14 %
Lymphs Abs: 1.1 10*3/uL (ref 0.7–4.0)
Monocytes Absolute: 0.7 10*3/uL (ref 0.1–1.0)
Monocytes Relative: 10 %
Neutro Abs: 5.9 10*3/uL (ref 1.7–7.7)
Neutrophils Relative %: 75 %

## 2022-05-19 LAB — PROTIME-INR
INR: 1.2 (ref 0.8–1.2)
Prothrombin Time: 15.4 seconds — ABNORMAL HIGH (ref 11.4–15.2)

## 2022-05-19 LAB — I-STAT CHEM 8, ED
BUN: 6 mg/dL — ABNORMAL LOW (ref 8–23)
Calcium, Ion: 1.18 mmol/L (ref 1.15–1.40)
Chloride: 103 mmol/L (ref 98–111)
Creatinine, Ser: 0.6 mg/dL — ABNORMAL LOW (ref 0.61–1.24)
Glucose, Bld: 99 mg/dL (ref 70–99)
HCT: 43 % (ref 39.0–52.0)
Hemoglobin: 14.6 g/dL (ref 13.0–17.0)
Potassium: 3.9 mmol/L (ref 3.5–5.1)
Sodium: 140 mmol/L (ref 135–145)
TCO2: 27 mmol/L (ref 22–32)

## 2022-05-19 LAB — RAPID URINE DRUG SCREEN, HOSP PERFORMED
Amphetamines: NOT DETECTED
Barbiturates: NOT DETECTED
Benzodiazepines: NOT DETECTED
Cocaine: NOT DETECTED
Opiates: NOT DETECTED
Tetrahydrocannabinol: NOT DETECTED

## 2022-05-19 LAB — CBC
HCT: 43.5 % (ref 39.0–52.0)
Hemoglobin: 14.3 g/dL (ref 13.0–17.0)
MCH: 32.2 pg (ref 26.0–34.0)
MCHC: 32.9 g/dL (ref 30.0–36.0)
MCV: 98 fL (ref 80.0–100.0)
Platelets: 191 10*3/uL (ref 150–400)
RBC: 4.44 MIL/uL (ref 4.22–5.81)
RDW: 12.7 % (ref 11.5–15.5)
WBC: 7.8 10*3/uL (ref 4.0–10.5)
nRBC: 0 % (ref 0.0–0.2)

## 2022-05-19 LAB — ETHANOL: Alcohol, Ethyl (B): <10 mg/dL (ref <10)

## 2022-05-19 LAB — APTT: aPTT: 34 seconds (ref 24–36)

## 2022-05-19 MED ORDER — IOHEXOL 350 MG/ML SOLN
75.0000 mL | Freq: Once | INTRAVENOUS | Status: AC | PRN
  Administered 2022-05-19: 75 mL via INTRAVENOUS

## 2022-05-19 NOTE — ED Provider Triage Note (Signed)
Emergency Medicine Provider Triage Evaluation Note  Alan Riley , a 77 y.o. male  was evaluated in triage.  Pt complains of left eye drooping, blurry vision for 3 days. Patient was evaluated by ophthalmology earlier today. They believe the patient likely has myogenic ptosis but recommended ED eval for likely CTA or MRA to rule out aneurysm/paralytic ptosis.   Review of Systems  Positive: As above Negative: As above  Physical Exam  BP 126/68 (BP Location: Right Arm)   Pulse 63   Temp 98 F (36.7 C)   Resp 17   Ht  (1.727 m)   Wt 78.6 kg   SpO2 97%   BMI 26.35 kg/m  Gen:   Awake, no distress   Resp:  Normal effort  MSK:   Moves extremities without difficulty  Other:    Medical Decision Making  Medically screening exam initiated at 6:13 PM.  Appropriate orders placed.  Alan Riley was informed that the remainder of the evaluation will be completed by another provider, this initial triage assessment does not replace that evaluation, and the importance of remaining in the ED until their evaluation is complete.     Darrick Grinder, PA-C 05/19/22 1818

## 2022-05-19 NOTE — ED Triage Notes (Addendum)
Per pt family, pt's left eye swelled up and he had blurred vision three days ago. Pt was sent here from ophthalmologist to r/o aneurysm. Pt's left eye lid is drooping. Pt's pupils are are PERRLA. Pupils are 5 mm bilat

## 2022-05-20 ENCOUNTER — Emergency Department (HOSPITAL_COMMUNITY): Payer: Medicare HMO

## 2022-05-20 DIAGNOSIS — R918 Other nonspecific abnormal finding of lung field: Secondary | ICD-10-CM | POA: Diagnosis not present

## 2022-05-20 MED ORDER — IOHEXOL 350 MG/ML SOLN
75.0000 mL | Freq: Once | INTRAVENOUS | Status: AC | PRN
  Administered 2022-05-20: 75 mL via INTRAVENOUS

## 2022-05-20 NOTE — ED Provider Notes (Signed)
[place] EMERGENCY DEPARTMENT AT [facility] Provider Note   CSN: [id number] Arrival date & time: 05/19/22  1801     History  No chief complaint on file.   Alan Riley is a 77 y.o. male.  Patient was referred to the emergency department by ophthalmologist.  Patient has been experiencing blurred vision and drooping left eyelid for 3 days.  He was seen by his ophthalmologist and was told to come to the ED to rule out an aneurysm.  Family are with the patient, provide translation services for him.  They have noticed that the amount that the eyelid droops has been variable, at times it is not really dripping at all and during that time he does not have blurred vision.       Home Medications Prior to Admission medications   Medication Sig Start Date End Date Taking? Authorizing Provider  acetaminophen (TYLENOL) 325 MG tablet Take 2 tablets (650 mg total) by mouth every 6 (six) hours as needed. Patient not taking: Reported on 06/21/2019 04/25/12   Ashok Norris, NP  atorvastatin (LIPITOR) 20 MG tablet Take 1 tablet (20 mg total) by mouth at bedtime. 07/09/19   Love, Evlyn Kanner, PA-C  Cholecalciferol (VITAMIN D PO) Take 25 mcg by mouth daily.     [provider]      Allergies    Patient has no known allergies.    Review of Systems   Review of Systems  Physical Exam Updated Vital Signs BP 126/68 (BP Location: Right Arm)   Pulse 63   Temp 98 F (36.7 C)   Resp 17   Ht 5\' 8"  (1.727 m)   Wt 78.6 kg   SpO2 97%   BMI 26.35 kg/m  Physical Exam Vitals and nursing note reviewed.  Constitutional:      General: He is not in acute distress.    Appearance: He is well-developed.  HENT:     Head: Normocephalic and atraumatic.     Mouth/Throat:     Mouth: Mucous membranes are moist.  Eyes:     General: Vision grossly intact. Gaze aligned appropriately.     Extraocular Movements: Extraocular movements intact.     Right eye: Normal extraocular motion.     Left  eye: Normal extraocular motion.     Conjunctiva/sclera: Conjunctivae normal.     Comments: Left ptosis  Cardiovascular:     Rate and Rhythm: Normal rate and regular rhythm.     Pulses: Normal pulses.     Heart sounds: Normal heart sounds, S1 normal and S2 normal. No murmur heard.    No friction rub. No gallop.  Pulmonary:     Effort: Pulmonary effort is normal. No respiratory distress.     Breath sounds: Normal breath sounds.  Abdominal:     Palpations: Abdomen is soft.     Tenderness: There is no abdominal tenderness. There is no guarding or rebound.     Hernia: No hernia is present.  Musculoskeletal:        General: No swelling.     Cervical back: Full passive range of motion without pain, normal range of motion and neck supple. No pain with movement, spinous process tenderness or muscular tenderness. Normal range of motion.     Right lower leg: No edema.     Left lower leg: No edema.  Skin:    General: Skin is warm and dry.     Capillary Refill: Capillary refill takes less than 2 seconds.  Findings: No ecchymosis, erythema, lesion or wound.  Neurological:     Mental Status: He is alert and oriented to person, place, and time.     GCS: GCS eye subscore is 4. GCS verbal subscore is 5. GCS motor subscore is 6.     Cranial Nerves: Cranial nerves 2-12 are intact.     Sensory: Sensation is intact.     Motor: Motor function is intact. No weakness or abnormal muscle tone.     Coordination: Coordination is intact.  Psychiatric:        Mood and Affect: Mood normal.        Speech: Speech normal.        Behavior: Behavior normal.     ED Results / Procedures / Treatments   Labs (all labs ordered are listed, but only abnormal results are displayed) Labs Reviewed  PROTIME-INR - Abnormal; Notable for the following components:      Result Value   Prothrombin Time 15.4 (*)    All other components within normal limits  COMPREHENSIVE METABOLIC PANEL - Abnormal; Notable for the  following components:   Glucose, Bld 107 (*)    BUN 5 (*)    Calcium 8.7 (*)    All other components within normal limits  I-STAT CHEM 8, ED - Abnormal; Notable for the following components:   BUN 6 (*)    Creatinine, Ser 0.60 (*)    All other components within normal limits  ETHANOL  APTT  CBC  DIFFERENTIAL  URINALYSIS, ROUTINE W REFLEX MICROSCOPIC  RAPID URINE DRUG SCREEN, HOSP PERFORMED    EKG None  Radiology CT Chest W Contrast  Result Date: 05/20/2022 CLINICAL DATA:  Myasthenia gravis suspected myasthenia gravis, eval for thymoma EXAM: CT CHEST WITH CONTRAST TECHNIQUE: Multidetector CT imaging of the chest was performed during intravenous contrast administration. RADIATION DOSE REDUCTION: This exam was performed according to the departmental dose-optimization program which includes automated exposure control, adjustment of the mA and/or kV according to patient size and/or use of iterative reconstruction technique. CONTRAST:  75mL OMNIPAQUE IOHEXOL 350 MG/ML SOLN COMPARISON:  None Available. FINDINGS: Cardiovascular: Heart is normal size. Aorta upper limits normal in caliber at 3.9 cm. No dissection. Scattered moderate coronary artery and aortic calcifications. Mediastinum/Nodes: No mediastinal, hilar, or axillary adenopathy. Trachea and esophagus are unremarkable. Thyroid unremarkable. No anterior mediastinal mass to suggest thymoma. Lungs/Pleura: Partially calcified nodule in the right lower lobe along the major fissure measuring 8 mm. 4 mm subpleural nodule anteriorly in the right upper lobe. 3 mm nodule in the right middle lobe along the minor fissure. 3 mm subpleural left lower lobe pulmonary nodule on image 110 of series 4. No confluent opacities or effusions. Upper Abdomen: No acute findings Musculoskeletal: Chest wall soft tissues are unremarkable. No acute bony abnormality. IMPRESSION: Multiple bilateral pulmonary nodules, the largest 8 mm. Non-contrast chest CT at 3-6 months is  recommended. If the nodules are stable at time of repeat CT, then future CT at 18-24 months (from today's scan) is considered optional for low-risk patients, but is recommended for high-risk patients. This recommendation follows the consensus statement: Guidelines for Management of Incidental Pulmonary Nodules Detected on CT Images: From the Fleischner Society 2017; Radiology 2017; 284:228-243. No evidence of thymoma. Coronary artery disease. Aortic Atherosclerosis (ICD10-I70.0). Electronically Signed   By: Charlett Nose M.D.   On: 05/20/2022 01:39   DG Chest 2 View  Result Date: 05/20/2022 CLINICAL DATA:  Ptosis EXAM: CHEST - 2 VIEW COMPARISON:  None Available.  FINDINGS: 1 cm nodule overlying the right lung base may represent a nipple shadow but is indeterminate. Lungs are otherwise clear. No pneumothorax or pleural effusion. Cardiac size within normal limits. Pulmonary vascularity is normal. No acute bone abnormality. IMPRESSION: 1. No pulmonary apical mass identified. 2. 1 cm nodule overlying the right lung base may represent a nipple shadow but is indeterminate. This could be confirmed with repeat examination with nipple markers. Electronically Signed   By: Helyn Numbers M.D.   On: 05/20/2022 00:31   CT Angio Head Neck W WO CM  Result Date: 05/19/2022 CLINICAL DATA:  Diplopia EXAM: CT ANGIOGRAPHY HEAD AND NECK WITH AND WITHOUT CONTRAST TECHNIQUE: Multidetector CT imaging of the head and neck was performed using the standard protocol during bolus administration of intravenous contrast. Multiplanar CT image reconstructions and MIPs were obtained to evaluate the vascular anatomy. Carotid stenosis measurements (when applicable) are obtained utilizing NASCET criteria, using the distal internal carotid diameter as the denominator. RADIATION DOSE REDUCTION: This exam was performed according to the departmental dose-optimization program which includes automated exposure control, adjustment of the mA and/or kV  according to patient size and/or use of iterative reconstruction technique. CONTRAST:  75mL OMNIPAQUE IOHEXOL 350 MG/ML SOLN COMPARISON:  None Available. FINDINGS: CT HEAD FINDINGS Brain: No evidence of acute infarction, hemorrhage, hydrocephalus, extra-axial collection or mass lesion/mass effect. There is a chronic infarct in the right posterior frontal lobe. Vascular: No hyperdense vessel or unexpected calcification. Skull: Normal. Negative for fracture or focal lesion. Sinuses/Orbits: Middle ear or mastoid effusion. Paranasal sinuses are clear. Orbits are unremarkable. Other: None. Review of the MIP images confirms the above findings CTA NECK FINDINGS Aortic arch: Standard branching. Imaged portion shows no evidence of aneurysm or dissection. No significant stenosis of the major arch vessel origins. Right carotid system: No evidence of dissection, stenosis (50% or greater), or occlusion. Left carotid system: No evidence of dissection, stenosis (50% or greater), or occlusion. Vertebral arteries: Left dominant. No evidence of dissection, stenosis (50% or greater), or occlusion. Skeleton: Status post C4-C5 ACDF and C6 laminectomy. Other neck: Negative. Upper chest: Negative. Review of the MIP images confirms the above findings CTA HEAD FINDINGS Anterior circulation: No proximal occlusion, aneurysm, or vascular malformation. There is occlusion of a distal M2/proximal M3 segment on the right (series 10, image 79), which likely explains the chronic right posterior frontal lobe infarct. Posterior circulation: No significant stenosis, proximal occlusion, aneurysm, or vascular malformation. Venous sinuses: As permitted by contrast timing, patent. Anatomic variants: No Review of the MIP images confirms the above findings IMPRESSION: 1. No acute intracranial abnormality. 2. Occlusion of a distal M2/proximal M3 segment on the right, which likely explains the chronic right posterior frontal lobe infarct. 3. No other  intracranial large vessel occlusion. 4. No hemodynamically significant stenosis in the neck. Electronically Signed   By: Lorenza Cambridge M.D.   On: 05/19/2022 21:00    Procedures Procedures    Medications Ordered in ED Medications  iohexol (OMNIPAQUE) 350 MG/ML injection 75 mL (75 mLs Intravenous Contrast Given 05/19/22 2034)  iohexol (OMNIPAQUE) 350 MG/ML injection 75 mL (75 mLs Intravenous Contrast Given 05/20/22 0131)    ED Course/ Medical Decision Making/ A&P                             Medical Decision Making Amount and/or Complexity of Data Reviewed Radiology: ordered.  Risk Prescription drug management.   Referred to the emergency department for  evaluation of blurred vision and drooping left eyelid  Differential diagnosis considered includes, but not limited to: Stroke; 3rd nerve palsy; Horner syndrome; malignancy; orbital infection; myasthemia gravis  Referred to the emergency department by ophthalmology to evaluate for possible aneurysm causing his ptosis.  A CT angiography has been performed and there are no acute findings.  No aneurysm seen.  Patient does have an old infarct.  Chest x-ray does not show any apical tumors.  On exam, he has normal pupillary response bilaterally.  No extraocular muscle palsy.  Discussed with Dr. Iver Nestle.  Presentation may be consistent with myasthenia gravis.  Patient not experiencing any shortness of breath or generalized weakness.  He will be appropriate for outpatient workup.  I did offer the patient CT of chest before discharge to help facilitate the workup.  This was performed and there is no evidence of thymoma.  Will refer to neurology.  Return to the ER immediately for generalized weakness or shortness of breath.         Final Clinical Impression(s) / ED Diagnoses Final diagnoses:  Ptosis of left eyelid    Rx / DC Orders ED Discharge Orders          Ordered    Ambulatory referral to Neurology       Comments: An  appointment is requested in approximately: 1 week   05/20/22 0148              Gilda Crease, MD 05/20/22 [id number]

## 2022-05-20 NOTE — Discharge Instructions (Signed)
We have ruled out many of the dangerous causes of ptosis (drooping eyelid).  The last thing that needs to be rule out is something called myasthenia gravis.  This can be done by neurologist in the office.  I have placed a referral.  When myasthenia gravis worsens it can cause difficulty breathing.  If you are experiencing weakness in your muscles or feel like you are not breathing well, return to the ED immediately.

## 2022-05-20 NOTE — Plan of Care (Signed)
  These are curbside recommendations based upon the information readily available in the chart on brief review as well as history and examination information provided to me by requesting provider and do not replace a full detailed consult   77 year old gentleman presenting with blurry vision and drooping eyelid for 3 days which has been variable.  Was sent by ophthalmology to rule out aneurysm.  CTA personally reviewed, agree with radiology read 1. No acute intracranial abnormality. 2. Occlusion of a distal M2/proximal M3 segment on the right, which likely explains the chronic right posterior frontal lobe infarct. 3. No other intracranial large vessel occlusion. 4. No hemodynamically significant stenosis in the neck.  ED provider Dr. Oletta Cohn reports isolated left eye ptosis on his examination which again is variable in nature even on examination.  No other symptoms including no shortness of breath or dysphagia, no headaches, no other pertinent findings on examination per Dr. Oletta Cohn, including normal pupillary reactivity  This history as provided is concerning for potential myasthenia gravis.  Can obtain CT chest with contrast for thymoma screening and to rule out any mass more sensitively that could be affecting the sympathetic chain.  Would counsel patient to avoid medications that could potentially exacerbate myasthenia gravis, return precautions for worsening symptoms, and close outpatient follow-up with neurology.  Inpatient evaluation would be indicated indicated if patient is more severely symptomatic   Drugs to avoid if you have Myasthenia Gravis:   Many drugs have been reported to have adverse effects in patients with MG (see below). However, not all patients react adversely to all these drugs. Conversely, not all "safe" drugs can be used with impunity in patients with MG.As a rule, the listed drugs should be avoided whenever possible, and patients with MG should be followed closely when  any new drug is introduced.  Drugs that may exacerbate MG  Antibiotics  Aminoglycosides: e.g., streptomycin, tobramycin, kanamycin  Quinolones: e.g., ciprofloxacin, levofloxacin, ofloxacin, gatifloxacin  Macrolides: e.g., erythromycin, azithromycin, telithromycin  Nondepolarizing muscle relaxants for surgery  d-Tubocurarine (curare), pancuronium, vecuronium, atracurium  Beta-blocking agents  Propranolol, atenolol, metoprolol  Local anesthetics and related agents  Procaine, Xylocaine in large amounts  Procainamide (for arrhythmias)  Botulinum toxin  Botox exacerbates weakness.  Quinine derivatives  Quinine, quinidine, chloroquine, mefloquine (Lariam)  Magnesium  Decreases ACh release  Penicillamine  May cause MG  Drugs with important interactions in MG  Cyclosporine  Broad range of drug interactions, which may raise or lower cyclosporine levels  Azathioprine  Avoid allopurinol; combination may result in myelosuppression.

## 2022-05-24 DIAGNOSIS — H0279 Other degenerative disorders of eyelid and periocular area: Secondary | ICD-10-CM | POA: Diagnosis not present

## 2022-05-24 DIAGNOSIS — H02831 Dermatochalasis of right upper eyelid: Secondary | ICD-10-CM | POA: Diagnosis not present

## 2022-05-24 DIAGNOSIS — Z01818 Encounter for other preprocedural examination: Secondary | ICD-10-CM | POA: Diagnosis not present

## 2022-05-24 DIAGNOSIS — H02422 Myogenic ptosis of left eyelid: Secondary | ICD-10-CM | POA: Diagnosis not present

## 2022-05-24 DIAGNOSIS — H57813 Brow ptosis, bilateral: Secondary | ICD-10-CM | POA: Diagnosis not present

## 2022-05-24 DIAGNOSIS — H02423 Myogenic ptosis of bilateral eyelids: Secondary | ICD-10-CM | POA: Diagnosis not present

## 2022-05-24 DIAGNOSIS — H02421 Myogenic ptosis of right eyelid: Secondary | ICD-10-CM | POA: Diagnosis not present

## 2022-05-24 DIAGNOSIS — H02834 Dermatochalasis of left upper eyelid: Secondary | ICD-10-CM | POA: Diagnosis not present

## 2022-05-24 DIAGNOSIS — H53483 Generalized contraction of visual field, bilateral: Secondary | ICD-10-CM | POA: Diagnosis not present

## 2022-05-27 ENCOUNTER — Telehealth: Payer: Self-pay

## 2022-05-27 NOTE — Telephone Encounter (Signed)
Transition Care Management Unsuccessful Follow-up Telephone Call  Date of discharge and from where:  4/26 [place]   Attempts:  1st Attempt  Reason for unsuccessful TCM follow-up call:  Unable to leave message   Salvatrice Morandi [facility], [place] [phone] [address], [place], [place] [address] Phone: [phone] Email: [email or web address]       

## 2022-05-30 ENCOUNTER — Telehealth: Payer: Self-pay

## 2022-05-30 NOTE — Telephone Encounter (Signed)
Transition Care Management Unsuccessful Follow-up Telephone Call  Date of discharge and from where:  [facility] 4/26  Attempts:  2nd Attempt  Reason for unsuccessful TCM follow-up call:  Unable to leave message

## 2022-07-10 NOTE — Progress Notes (Signed)
Noted patient has had follow up labs 

## 2022-07-19 DIAGNOSIS — D126 Benign neoplasm of colon, unspecified: Secondary | ICD-10-CM | POA: Diagnosis not present

## 2022-07-19 DIAGNOSIS — Z85038 Personal history of other malignant neoplasm of large intestine: Secondary | ICD-10-CM | POA: Diagnosis not present

## 2022-07-25 DIAGNOSIS — H57813 Brow ptosis, bilateral: Secondary | ICD-10-CM | POA: Diagnosis not present

## 2022-07-25 DIAGNOSIS — H02831 Dermatochalasis of right upper eyelid: Secondary | ICD-10-CM | POA: Diagnosis not present

## 2022-07-25 DIAGNOSIS — H02122 Mechanical ectropion of right lower eyelid: Secondary | ICD-10-CM | POA: Diagnosis not present

## 2022-07-25 DIAGNOSIS — H02422 Myogenic ptosis of left eyelid: Secondary | ICD-10-CM | POA: Diagnosis not present

## 2022-07-25 DIAGNOSIS — H0279 Other degenerative disorders of eyelid and periocular area: Secondary | ICD-10-CM | POA: Diagnosis not present

## 2022-07-25 DIAGNOSIS — H02421 Myogenic ptosis of right eyelid: Secondary | ICD-10-CM | POA: Diagnosis not present

## 2022-07-25 DIAGNOSIS — H02125 Mechanical ectropion of left lower eyelid: Secondary | ICD-10-CM | POA: Diagnosis not present

## 2022-07-25 DIAGNOSIS — H02834 Dermatochalasis of left upper eyelid: Secondary | ICD-10-CM | POA: Diagnosis not present

## 2022-07-25 DIAGNOSIS — H02423 Myogenic ptosis of bilateral eyelids: Secondary | ICD-10-CM | POA: Diagnosis not present

## 2022-08-02 DIAGNOSIS — D124 Benign neoplasm of descending colon: Secondary | ICD-10-CM | POA: Diagnosis not present

## 2022-08-02 DIAGNOSIS — K5730 Diverticulosis of large intestine without perforation or abscess without bleeding: Secondary | ICD-10-CM | POA: Diagnosis not present

## 2022-08-02 DIAGNOSIS — D123 Benign neoplasm of transverse colon: Secondary | ICD-10-CM | POA: Diagnosis not present

## 2022-08-02 DIAGNOSIS — D128 Benign neoplasm of rectum: Secondary | ICD-10-CM | POA: Diagnosis not present

## 2022-08-02 DIAGNOSIS — Z86010 Personal history of colonic polyps: Secondary | ICD-10-CM | POA: Diagnosis not present

## 2022-08-02 DIAGNOSIS — Z09 Encounter for follow-up examination after completed treatment for conditions other than malignant neoplasm: Secondary | ICD-10-CM | POA: Diagnosis not present

## 2022-08-02 DIAGNOSIS — D125 Benign neoplasm of sigmoid colon: Secondary | ICD-10-CM | POA: Diagnosis not present

## 2022-08-04 DIAGNOSIS — D128 Benign neoplasm of rectum: Secondary | ICD-10-CM | POA: Diagnosis not present

## 2022-08-04 DIAGNOSIS — D124 Benign neoplasm of descending colon: Secondary | ICD-10-CM | POA: Diagnosis not present

## 2022-12-15 DIAGNOSIS — D3131 Benign neoplasm of right choroid: Secondary | ICD-10-CM | POA: Diagnosis not present

## 2022-12-15 DIAGNOSIS — H5203 Hypermetropia, bilateral: Secondary | ICD-10-CM | POA: Diagnosis not present

## 2022-12-15 DIAGNOSIS — H11001 Unspecified pterygium of right eye: Secondary | ICD-10-CM | POA: Diagnosis not present

## 2022-12-15 DIAGNOSIS — H2513 Age-related nuclear cataract, bilateral: Secondary | ICD-10-CM | POA: Diagnosis not present

## 2022-12-15 DIAGNOSIS — H524 Presbyopia: Secondary | ICD-10-CM | POA: Diagnosis not present

## 2022-12-15 DIAGNOSIS — Z135 Encounter for screening for eye and ear disorders: Secondary | ICD-10-CM | POA: Diagnosis not present

## 2022-12-15 DIAGNOSIS — H52223 Regular astigmatism, bilateral: Secondary | ICD-10-CM | POA: Diagnosis not present

## 2022-12-15 DIAGNOSIS — H02402 Unspecified ptosis of left eyelid: Secondary | ICD-10-CM | POA: Diagnosis not present

## 2022-12-21 DIAGNOSIS — E7800 Pure hypercholesterolemia, unspecified: Secondary | ICD-10-CM | POA: Diagnosis not present

## 2022-12-21 DIAGNOSIS — Z125 Encounter for screening for malignant neoplasm of prostate: Secondary | ICD-10-CM | POA: Diagnosis not present

## 2022-12-21 DIAGNOSIS — R7309 Other abnormal glucose: Secondary | ICD-10-CM | POA: Diagnosis not present

## 2022-12-21 DIAGNOSIS — Z79899 Other long term (current) drug therapy: Secondary | ICD-10-CM | POA: Diagnosis not present

## 2022-12-21 DIAGNOSIS — Z85038 Personal history of other malignant neoplasm of large intestine: Secondary | ICD-10-CM | POA: Diagnosis not present

## 2022-12-21 DIAGNOSIS — E559 Vitamin D deficiency, unspecified: Secondary | ICD-10-CM | POA: Diagnosis not present

## 2022-12-21 DIAGNOSIS — R946 Abnormal results of thyroid function studies: Secondary | ICD-10-CM | POA: Diagnosis not present

## 2022-12-28 DIAGNOSIS — R946 Abnormal results of thyroid function studies: Secondary | ICD-10-CM | POA: Diagnosis not present

## 2022-12-28 DIAGNOSIS — R7309 Other abnormal glucose: Secondary | ICD-10-CM | POA: Diagnosis not present

## 2022-12-28 DIAGNOSIS — E7800 Pure hypercholesterolemia, unspecified: Secondary | ICD-10-CM | POA: Diagnosis not present

## 2022-12-28 DIAGNOSIS — E559 Vitamin D deficiency, unspecified: Secondary | ICD-10-CM | POA: Diagnosis not present

## 2022-12-28 DIAGNOSIS — Z0000 Encounter for general adult medical examination without abnormal findings: Secondary | ICD-10-CM | POA: Diagnosis not present

## 2022-12-28 DIAGNOSIS — Z85038 Personal history of other malignant neoplasm of large intestine: Secondary | ICD-10-CM | POA: Diagnosis not present

## 2022-12-28 DIAGNOSIS — Z79899 Other long term (current) drug therapy: Secondary | ICD-10-CM | POA: Diagnosis not present

## 2023-04-20 DIAGNOSIS — Z85038 Personal history of other malignant neoplasm of large intestine: Secondary | ICD-10-CM | POA: Diagnosis not present

## 2023-04-20 DIAGNOSIS — Z860101 Personal history of adenomatous and serrated colon polyps: Secondary | ICD-10-CM | POA: Diagnosis not present

## 2023-05-02 DIAGNOSIS — Z09 Encounter for follow-up examination after completed treatment for conditions other than malignant neoplasm: Secondary | ICD-10-CM | POA: Diagnosis not present

## 2023-05-02 DIAGNOSIS — K5730 Diverticulosis of large intestine without perforation or abscess without bleeding: Secondary | ICD-10-CM | POA: Diagnosis not present

## 2023-05-02 DIAGNOSIS — Z85038 Personal history of other malignant neoplasm of large intestine: Secondary | ICD-10-CM | POA: Diagnosis not present

## 2023-05-02 DIAGNOSIS — Z860101 Personal history of adenomatous and serrated colon polyps: Secondary | ICD-10-CM | POA: Diagnosis not present

## 2023-05-02 DIAGNOSIS — D124 Benign neoplasm of descending colon: Secondary | ICD-10-CM | POA: Diagnosis not present

## 2023-05-02 DIAGNOSIS — D123 Benign neoplasm of transverse colon: Secondary | ICD-10-CM | POA: Diagnosis not present

## 2023-05-04 DIAGNOSIS — D123 Benign neoplasm of transverse colon: Secondary | ICD-10-CM | POA: Diagnosis not present

## 2023-05-04 DIAGNOSIS — D124 Benign neoplasm of descending colon: Secondary | ICD-10-CM | POA: Diagnosis not present

## 2023-12-18 DIAGNOSIS — D3131 Benign neoplasm of right choroid: Secondary | ICD-10-CM | POA: Diagnosis not present

## 2023-12-18 DIAGNOSIS — H02402 Unspecified ptosis of left eyelid: Secondary | ICD-10-CM | POA: Diagnosis not present

## 2023-12-18 DIAGNOSIS — H11001 Unspecified pterygium of right eye: Secondary | ICD-10-CM | POA: Diagnosis not present

## 2023-12-18 DIAGNOSIS — H524 Presbyopia: Secondary | ICD-10-CM | POA: Diagnosis not present

## 2023-12-18 DIAGNOSIS — Z135 Encounter for screening for eye and ear disorders: Secondary | ICD-10-CM | POA: Diagnosis not present

## 2023-12-18 DIAGNOSIS — H52223 Regular astigmatism, bilateral: Secondary | ICD-10-CM | POA: Diagnosis not present

## 2023-12-18 DIAGNOSIS — H2513 Age-related nuclear cataract, bilateral: Secondary | ICD-10-CM | POA: Diagnosis not present

## 2023-12-18 DIAGNOSIS — H5203 Hypermetropia, bilateral: Secondary | ICD-10-CM | POA: Diagnosis not present

## 2023-12-19 DIAGNOSIS — E559 Vitamin D deficiency, unspecified: Secondary | ICD-10-CM | POA: Diagnosis not present

## 2023-12-19 DIAGNOSIS — E7800 Pure hypercholesterolemia, unspecified: Secondary | ICD-10-CM | POA: Diagnosis not present

## 2023-12-19 DIAGNOSIS — Z125 Encounter for screening for malignant neoplasm of prostate: Secondary | ICD-10-CM | POA: Diagnosis not present

## 2023-12-19 DIAGNOSIS — R7309 Other abnormal glucose: Secondary | ICD-10-CM | POA: Diagnosis not present

## 2023-12-19 DIAGNOSIS — Z79899 Other long term (current) drug therapy: Secondary | ICD-10-CM | POA: Diagnosis not present

## 2023-12-19 DIAGNOSIS — Z85038 Personal history of other malignant neoplasm of large intestine: Secondary | ICD-10-CM | POA: Diagnosis not present

## 2023-12-19 DIAGNOSIS — R946 Abnormal results of thyroid function studies: Secondary | ICD-10-CM | POA: Diagnosis not present
